# Patient Record
Sex: Male | Born: 1935
Health system: Southern US, Community
[De-identification: ages and names within clinical notes are randomized; demographics above are authoritative.]

## PROBLEM LIST (undated history)

## (undated) DIAGNOSIS — N400 Enlarged prostate without lower urinary tract symptoms: Secondary | ICD-10-CM

## (undated) DIAGNOSIS — I2510 Atherosclerotic heart disease of native coronary artery without angina pectoris: Secondary | ICD-10-CM

## (undated) DIAGNOSIS — M1990 Unspecified osteoarthritis, unspecified site: Secondary | ICD-10-CM

## (undated) DIAGNOSIS — K219 Gastro-esophageal reflux disease without esophagitis: Secondary | ICD-10-CM

## (undated) DIAGNOSIS — C801 Malignant (primary) neoplasm, unspecified: Secondary | ICD-10-CM

## (undated) DIAGNOSIS — E7800 Pure hypercholesterolemia, unspecified: Secondary | ICD-10-CM

## (undated) DIAGNOSIS — I10 Essential (primary) hypertension: Secondary | ICD-10-CM

## (undated) DIAGNOSIS — Z860100 Personal history of colon polyps, unspecified: Secondary | ICD-10-CM

## (undated) HISTORY — PX: SHOULDER SURGERY: SHX246

## (undated) HISTORY — DX: Essential (primary) hypertension: I10

## (undated) HISTORY — DX: Personal history of colonic polyps: Z86.010

## (undated) HISTORY — DX: Gastro-esophageal reflux disease without esophagitis: K21.9

## (undated) HISTORY — DX: Pure hypercholesterolemia, unspecified: E78.00

## (undated) HISTORY — DX: Benign prostatic hyperplasia without lower urinary tract symptoms: N40.0

## (undated) HISTORY — DX: Personal history of colon polyps, unspecified: Z86.0100

## (undated) HISTORY — DX: Unspecified osteoarthritis, unspecified site: M19.90

## (undated) SURGERY — ENDOSCOPIC RETROGRADE CHOLANGIOPANCREATOGRAPHY (ERCP) WITH PROPOFOL
Anesthesia: General

---

## 2003-04-17 ENCOUNTER — Ambulatory Visit (HOSPITAL_COMMUNITY): Admission: RE | Admit: 2003-04-17 | Discharge: 2003-04-17 | Payer: Self-pay | Admitting: Pulmonary Disease

## 2003-04-17 ENCOUNTER — Encounter: Payer: Self-pay | Admitting: Pulmonary Disease

## 2003-11-15 ENCOUNTER — Ambulatory Visit (HOSPITAL_COMMUNITY): Admission: RE | Admit: 2003-11-15 | Discharge: 2003-11-15 | Payer: Self-pay | Admitting: Gastroenterology

## 2004-01-17 ENCOUNTER — Ambulatory Visit (HOSPITAL_COMMUNITY): Admission: RE | Admit: 2004-01-17 | Discharge: 2004-01-17 | Payer: Self-pay | Admitting: Gastroenterology

## 2004-10-15 ENCOUNTER — Ambulatory Visit: Payer: Self-pay | Admitting: Pulmonary Disease

## 2005-04-15 ENCOUNTER — Ambulatory Visit: Payer: Self-pay | Admitting: Pulmonary Disease

## 2005-10-09 ENCOUNTER — Ambulatory Visit: Payer: Self-pay | Admitting: Pulmonary Disease

## 2006-01-19 ENCOUNTER — Ambulatory Visit: Payer: Self-pay | Admitting: Pulmonary Disease

## 2006-05-10 ENCOUNTER — Ambulatory Visit: Payer: Self-pay | Admitting: Pulmonary Disease

## 2006-05-14 ENCOUNTER — Ambulatory Visit: Payer: Self-pay | Admitting: Pulmonary Disease

## 2006-06-07 ENCOUNTER — Ambulatory Visit (HOSPITAL_COMMUNITY): Admission: RE | Admit: 2006-06-07 | Discharge: 2006-06-07 | Payer: Self-pay | Admitting: Orthopedic Surgery

## 2006-11-09 ENCOUNTER — Ambulatory Visit: Payer: Self-pay | Admitting: Pulmonary Disease

## 2007-05-11 ENCOUNTER — Ambulatory Visit: Payer: Self-pay | Admitting: Pulmonary Disease

## 2007-05-11 LAB — CONVERTED CEMR LAB
Albumin: 3.9 g/dL (ref 3.5–5.2)
BUN: 15 mg/dL (ref 6–23)
Basophils Relative: 0.1 % (ref 0.0–1.0)
Bilirubin Urine: NEGATIVE
CO2: 31 meq/L (ref 19–32)
Cholesterol: 202 mg/dL (ref 0–200)
Creatinine, Ser: 1 mg/dL (ref 0.4–1.5)
Direct LDL: 135 mg/dL
GFR calc non Af Amer: 79 mL/min
HCT: 44 % (ref 39.0–52.0)
HDL: 39.4 mg/dL (ref >39.0)
Hemoglobin, Urine: NEGATIVE
Hemoglobin: 15.2 g/dL (ref 13.0–17.0)
Leukocytes, UA: NEGATIVE
Monocytes Absolute: 0.5 10*3/uL (ref 0.2–0.7)
Monocytes Relative: 7.8 % (ref 3.0–11.0)
PSA: 1.13 ng/mL (ref 0.10–4.00)
Potassium: 4.6 meq/L (ref 3.5–5.1)
RDW: 12.3 % (ref 11.5–14.6)
Specific Gravity, Urine: 1.015 (ref 1.000–1.03)
TSH: 2.16 microintl units/mL (ref 0.35–5.50)
Triglycerides: 132 mg/dL (ref 0–149)
Urine Glucose: NEGATIVE mg/dL
WBC: 6.6 10*3/uL (ref 4.5–10.5)

## 2007-05-18 ENCOUNTER — Encounter: Payer: Self-pay | Admitting: Pulmonary Disease

## 2007-05-18 LAB — CONVERTED CEMR LAB
OCCULT 1: NEGATIVE
OCCULT 2: NEGATIVE
OCCULT 3: NEGATIVE
OCCULT 4: NEGATIVE

## 2007-06-07 ENCOUNTER — Ambulatory Visit: Payer: Self-pay | Admitting: Gastroenterology

## 2007-06-16 ENCOUNTER — Ambulatory Visit: Payer: Self-pay | Admitting: Gastroenterology

## 2008-05-02 ENCOUNTER — Ambulatory Visit: Payer: Self-pay | Admitting: Pulmonary Disease

## 2008-05-15 DIAGNOSIS — Z87898 Personal history of other specified conditions: Secondary | ICD-10-CM | POA: Insufficient documentation

## 2008-05-15 DIAGNOSIS — K219 Gastro-esophageal reflux disease without esophagitis: Secondary | ICD-10-CM | POA: Insufficient documentation

## 2008-05-15 DIAGNOSIS — I10 Essential (primary) hypertension: Secondary | ICD-10-CM

## 2008-05-15 DIAGNOSIS — D126 Benign neoplasm of colon, unspecified: Secondary | ICD-10-CM

## 2008-05-15 DIAGNOSIS — M1990 Unspecified osteoarthritis, unspecified site: Secondary | ICD-10-CM | POA: Insufficient documentation

## 2008-07-03 ENCOUNTER — Ambulatory Visit: Payer: Self-pay | Admitting: Pulmonary Disease

## 2008-07-03 DIAGNOSIS — E7800 Pure hypercholesterolemia, unspecified: Secondary | ICD-10-CM | POA: Insufficient documentation

## 2008-07-06 LAB — CONVERTED CEMR LAB
ALT: 19 units/L (ref 0–53)
AST: 25 units/L (ref 0–37)
Albumin: 4.1 g/dL (ref 3.5–5.2)
Alkaline Phosphatase: 52 units/L (ref 39–117)
BUN: 13 mg/dL (ref 6–23)
Bilirubin, Direct: 0.1 mg/dL (ref 0.0–0.3)
CO2: 31 meq/L (ref 19–32)
Chloride: 104 meq/L (ref 96–112)
Glucose, Bld: 103 mg/dL — ABNORMAL HIGH (ref 70–99)
Hemoglobin: 15.7 g/dL (ref 13.0–17.0)
LDL Cholesterol: 128 mg/dL — ABNORMAL HIGH (ref 0–99)
Lymphocytes Relative: 27.6 % (ref 12.0–46.0)
Monocytes Relative: 7.3 % (ref 3.0–12.0)
Neutro Abs: 3.9 10*3/uL (ref 1.4–7.7)
Neutrophils Relative %: 61.3 % (ref 43.0–77.0)
Potassium: 4.4 meq/L (ref 3.5–5.1)
RDW: 12.1 % (ref 11.5–14.6)
Sodium: 140 meq/L (ref 135–145)
Total CHOL/HDL Ratio: 4.7
Total Protein: 7.7 g/dL (ref 6.0–8.3)

## 2008-08-01 ENCOUNTER — Ambulatory Visit: Payer: Self-pay | Admitting: Pulmonary Disease

## 2008-11-12 ENCOUNTER — Ambulatory Visit: Payer: Self-pay | Admitting: Pulmonary Disease

## 2008-11-13 LAB — CONVERTED CEMR LAB
Basophils Absolute: 0 10*3/uL (ref 0.0–0.1)
Eosinophils Absolute: 0.4 10*3/uL (ref 0.0–0.7)
Folate: >20 ng/mL
HCT: 40.8 % (ref 39.0–52.0)
Hemoglobin: 14.2 g/dL (ref 13.0–17.0)
Lymphs Abs: 2.3 10*3/uL (ref 0.7–4.0)
MCHC: 34.8 g/dL (ref 30.0–36.0)
Monocytes Absolute: 0.7 10*3/uL (ref 0.1–1.0)
Neutro Abs: 4.3 10*3/uL (ref 1.4–7.7)
RDW: 12 % (ref 11.5–14.6)

## 2008-11-26 ENCOUNTER — Telehealth: Payer: Self-pay | Admitting: Pulmonary Disease

## 2008-12-25 ENCOUNTER — Ambulatory Visit: Payer: Self-pay | Admitting: Pulmonary Disease

## 2009-07-03 ENCOUNTER — Ambulatory Visit: Payer: Self-pay | Admitting: Pulmonary Disease

## 2009-07-03 LAB — CONVERTED CEMR LAB
AST: 25 units/L (ref 0–37)
Albumin: 4.2 g/dL (ref 3.5–5.2)
Alkaline Phosphatase: 41 units/L (ref 39–117)
BUN: 13 mg/dL (ref 6–23)
CO2: 30 meq/L (ref 19–32)
Chloride: 104 meq/L (ref 96–112)
Cholesterol: 206 mg/dL — ABNORMAL HIGH (ref 0–200)
Eosinophils Relative: 3.9 % (ref 0.0–5.0)
Glucose, Bld: 99 mg/dL (ref 70–99)
HCT: 43.9 % (ref 39.0–52.0)
MCV: 96.3 fL (ref 78.0–100.0)
Platelets: 145 10*3/uL — ABNORMAL LOW (ref 150.0–400.0)
Potassium: 4.6 meq/L (ref 3.5–5.1)
Total Protein: 7.4 g/dL (ref 6.0–8.3)
WBC: 6.2 10*3/uL (ref 4.5–10.5)

## 2009-10-09 ENCOUNTER — Telehealth: Payer: Self-pay | Admitting: Pulmonary Disease

## 2009-12-31 ENCOUNTER — Ambulatory Visit: Payer: Self-pay | Admitting: Pulmonary Disease

## 2010-01-02 ENCOUNTER — Ambulatory Visit: Payer: Self-pay | Admitting: Pulmonary Disease

## 2010-01-12 LAB — CONVERTED CEMR LAB
CO2: 24 meq/L (ref 19–32)
Calcium: 8.8 mg/dL (ref 8.4–10.5)
Cholesterol: 161 mg/dL (ref 0–200)
Glucose, Bld: 92 mg/dL (ref 70–99)
HDL: 37.8 mg/dL — ABNORMAL LOW (ref >39.00)
Potassium: 4.4 meq/L (ref 3.5–5.1)
Sodium: 140 meq/L (ref 135–145)
Triglycerides: 181 mg/dL — ABNORMAL HIGH (ref 0.0–149.0)
VLDL: 36.2 mg/dL (ref 0.0–40.0)

## 2010-05-01 ENCOUNTER — Encounter: Payer: Self-pay | Admitting: Pulmonary Disease

## 2010-07-01 ENCOUNTER — Ambulatory Visit: Payer: Self-pay | Admitting: Pulmonary Disease

## 2010-07-07 LAB — CONVERTED CEMR LAB
AST: 22 units/L (ref 0–37)
BUN: 17 mg/dL (ref 6–23)
Basophils Relative: 0.2 % (ref 0.0–3.0)
Bilirubin, Direct: 0.1 mg/dL (ref 0.0–0.3)
Chloride: 105 meq/L (ref 96–112)
Eosinophils Absolute: 0.3 10*3/uL (ref 0.0–0.7)
Eosinophils Relative: 4.2 % (ref 0.0–5.0)
GFR calc non Af Amer: 75.31 mL/min (ref >60)
HCT: 44.2 % (ref 39.0–52.0)
LDL Cholesterol: 113 mg/dL — ABNORMAL HIGH (ref 0–99)
Lymphs Abs: 2 10*3/uL (ref 0.7–4.0)
MCHC: 34.1 g/dL (ref 30.0–36.0)
MCV: 95.2 fL (ref 78.0–100.0)
Monocytes Absolute: 0.5 10*3/uL (ref 0.1–1.0)
Neutrophils Relative %: 57.6 % (ref 43.0–77.0)
Platelets: 166 10*3/uL (ref 150.0–400.0)
Potassium: 5 meq/L (ref 3.5–5.1)
Sodium: 140 meq/L (ref 135–145)
TSH: 1.71 microintl units/mL (ref 0.35–5.50)
Total Bilirubin: 0.9 mg/dL (ref 0.3–1.2)
Total CHOL/HDL Ratio: 4
WBC: 6.6 10*3/uL (ref 4.5–10.5)

## 2010-08-10 HISTORY — PX: CATARACT EXTRACTION, BILATERAL: SHX1313

## 2010-08-10 HISTORY — PX: MOHS SURGERY: SHX181

## 2010-09-09 NOTE — Miscellaneous (Signed)
Summary: Flu Vaccine / [facility]  Flu Vaccine / [facility]   Imported By: Lennie Odor 05/07/2010 16:52:57  _____________________________________________________________________  External Attachment:    Type:   Image     Comment:   External Document
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
## 2010-09-09 NOTE — Progress Notes (Signed)
Summary: stopped med  Phone Note Call from Patient Call back at [phone]   Caller: Patient Call For: Ronald Bird Reason for Call: Talk to Nurse Summary of Call: had to stop taking Cholesterol med due to body and muscle aches.  Please call pt to discuss. Initial call taken by: Eugene Gavia,  October 09, 2009 10:09 AM  Follow-up for Phone Call        Pt c/o bodyaches and cramps and stopped Pravastatin about 2 weeks ago and same has stopped. Pt states son had the same problem with Statins and had to go on Zetia. Pt wants to know SN recs. Please advise. Zackery Barefoot CMA  October 09, 2009 11:17 AM   Additional Follow-up for Phone Call Additional follow up Details #1::        PER SN HE AGREE'S TO STOP PRAVASTATIN FOR NOW, TRY ZETIA 10MG  1 by mouth once daily REFILL as needed  Additional Follow-up by: Philipp Deputy CMA,  October 09, 2009 3:39 PM    Additional Follow-up for Phone Call Additional follow up Details #2::    pt advised and rx sent. Carron Curie CMA  October 09, 2009 4:31 PM   New/Updated Medications: ZETIA 10 MG TABS (EZETIMIBE) Take 1 tablet by mouth once a day Prescriptions: ZETIA 10 MG TABS (EZETIMIBE) Take 1 tablet by mouth once a day  #30 x prn   Entered by:   Carron Curie CMA   Authorized by:   Michele Mcalpine MD   Signed by:   Carron Curie CMA on 10/09/2009   Method used:   Electronically to        [facility]  [id number] (retail)       [address]       [place], [place]  [address]       Ph: [phone] or [phone]       Fax: [phone]   RxID:   [id number]
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
## 2010-12-30 ENCOUNTER — Other Ambulatory Visit (INDEPENDENT_AMBULATORY_CARE_PROVIDER_SITE_OTHER): Payer: Medicare Other

## 2010-12-30 ENCOUNTER — Ambulatory Visit (INDEPENDENT_AMBULATORY_CARE_PROVIDER_SITE_OTHER): Payer: Medicare Other | Admitting: Pulmonary Disease

## 2010-12-30 ENCOUNTER — Encounter: Payer: Self-pay | Admitting: Pulmonary Disease

## 2010-12-30 DIAGNOSIS — R5381 Other malaise: Secondary | ICD-10-CM

## 2010-12-30 DIAGNOSIS — R531 Weakness: Secondary | ICD-10-CM

## 2010-12-30 DIAGNOSIS — I10 Essential (primary) hypertension: Secondary | ICD-10-CM

## 2010-12-30 DIAGNOSIS — R5383 Other fatigue: Secondary | ICD-10-CM

## 2010-12-30 DIAGNOSIS — E7800 Pure hypercholesterolemia, unspecified: Secondary | ICD-10-CM

## 2010-12-30 DIAGNOSIS — Z87898 Personal history of other specified conditions: Secondary | ICD-10-CM

## 2010-12-30 DIAGNOSIS — M1990 Unspecified osteoarthritis, unspecified site: Secondary | ICD-10-CM

## 2010-12-30 DIAGNOSIS — K219 Gastro-esophageal reflux disease without esophagitis: Secondary | ICD-10-CM

## 2010-12-30 DIAGNOSIS — D126 Benign neoplasm of colon, unspecified: Secondary | ICD-10-CM

## 2010-12-30 LAB — BASIC METABOLIC PANEL
BUN: 18 mg/dL (ref 6–23)
Chloride: 107 mEq/L (ref 96–112)
GFR: 76.92 mL/min (ref >60.00)
Glucose, Bld: 98 mg/dL (ref 70–99)
Potassium: 4.5 mEq/L (ref 3.5–5.1)

## 2010-12-30 LAB — LIPID PANEL
Cholesterol: 158 mg/dL (ref 0–200)
LDL Cholesterol: 91 mg/dL (ref 0–99)
VLDL: 27 mg/dL (ref 0.0–40.0)

## 2010-12-30 LAB — TESTOSTERONE: Testosterone: 452.71 ng/dL (ref 350.00–890.00)
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
## 2010-12-30 NOTE — Patient Instructions (Signed)
Today we updated your med list in EPIC...    Continue your current meds the same...  Today we did your follow up fasting blood work...    Please call the [facility]REE in a few days for your results...    Dial [id number] & when prompted enter your patient number followed by the # symbol...    Your patient number is:  [id number]#  Try to increase your exercise program... Call for any questions... Let's plan a follow up appt in 6 months.Marland KitchenMarland Kitchen

## 2011-07-01 ENCOUNTER — Encounter: Payer: Self-pay | Admitting: Pulmonary Disease

## 2011-07-01 ENCOUNTER — Ambulatory Visit (INDEPENDENT_AMBULATORY_CARE_PROVIDER_SITE_OTHER): Payer: Medicare Other | Admitting: Pulmonary Disease

## 2011-07-01 DIAGNOSIS — M1990 Unspecified osteoarthritis, unspecified site: Secondary | ICD-10-CM

## 2011-07-01 DIAGNOSIS — E7800 Pure hypercholesterolemia, unspecified: Secondary | ICD-10-CM

## 2011-07-01 DIAGNOSIS — Z87898 Personal history of other specified conditions: Secondary | ICD-10-CM

## 2011-07-01 DIAGNOSIS — I10 Essential (primary) hypertension: Secondary | ICD-10-CM

## 2011-07-01 DIAGNOSIS — K219 Gastro-esophageal reflux disease without esophagitis: Secondary | ICD-10-CM

## 2011-07-01 DIAGNOSIS — D126 Benign neoplasm of colon, unspecified: Secondary | ICD-10-CM

## 2011-07-01 MED ORDER — LISINOPRIL 20 MG PO TABS: 20.0000 mg | ORAL_TABLET | Freq: Every day | ORAL | Status: DC

## 2011-07-01 MED ORDER — EZETIMIBE 10 MG PO TABS: 10.0000 mg | ORAL_TABLET | Freq: Every day | ORAL | Status: DC

## 2011-07-01 MED ORDER — PANTOPRAZOLE SODIUM 40 MG PO TBEC: 40.0000 mg | DELAYED_RELEASE_TABLET | Freq: Every day | ORAL | Status: AC

## 2011-07-01 MED ORDER — AMLODIPINE BESYLATE 5 MG PO TABS: 5.0000 mg | ORAL_TABLET | Freq: Every day | ORAL | Status: DC

## 2011-07-01 MED ORDER — ATENOLOL 50 MG PO TABS: 50.0000 mg | ORAL_TABLET | Freq: Every day | ORAL | Status: DC

## 2011-07-01 NOTE — Patient Instructions (Signed)
Today we updated your med list in our EPIC system...    Continue your current medications the same...  We refilled your meds per request...  Call for any questions...  Let's plan a follow up visit w/ fasting blood work in 6 months.Marland KitchenMarland Kitchen

## 2011-07-30 ENCOUNTER — Encounter: Payer: Self-pay | Admitting: Pulmonary Disease
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
## 2011-12-30 ENCOUNTER — Ambulatory Visit: Payer: Medicare Other | Admitting: Pulmonary Disease

## 2012-06-03 ENCOUNTER — Encounter: Payer: Self-pay | Admitting: Gastroenterology

## 2012-11-30 ENCOUNTER — Encounter: Payer: Self-pay | Admitting: Gastroenterology

## 2014-09-13 DIAGNOSIS — M5021 Other cervical disc displacement,  high cervical region: Secondary | ICD-10-CM | POA: Diagnosis not present

## 2014-09-13 DIAGNOSIS — M9903 Segmental and somatic dysfunction of lumbar region: Secondary | ICD-10-CM | POA: Diagnosis not present

## 2014-09-13 DIAGNOSIS — M9901 Segmental and somatic dysfunction of cervical region: Secondary | ICD-10-CM | POA: Diagnosis not present

## 2014-09-13 DIAGNOSIS — M9905 Segmental and somatic dysfunction of pelvic region: Secondary | ICD-10-CM | POA: Diagnosis not present

## 2014-09-13 DIAGNOSIS — M5416 Radiculopathy, lumbar region: Secondary | ICD-10-CM | POA: Diagnosis not present

## 2014-10-02 DIAGNOSIS — L02415 Cutaneous abscess of right lower limb: Secondary | ICD-10-CM | POA: Diagnosis not present

## 2014-10-30 DIAGNOSIS — M5416 Radiculopathy, lumbar region: Secondary | ICD-10-CM | POA: Diagnosis not present

## 2014-10-30 DIAGNOSIS — M9903 Segmental and somatic dysfunction of lumbar region: Secondary | ICD-10-CM | POA: Diagnosis not present

## 2014-10-30 DIAGNOSIS — M9901 Segmental and somatic dysfunction of cervical region: Secondary | ICD-10-CM | POA: Diagnosis not present

## 2014-10-30 DIAGNOSIS — M9905 Segmental and somatic dysfunction of pelvic region: Secondary | ICD-10-CM | POA: Diagnosis not present

## 2014-10-30 DIAGNOSIS — M5021 Other cervical disc displacement,  high cervical region: Secondary | ICD-10-CM | POA: Diagnosis not present

## 2014-12-11 DIAGNOSIS — M5416 Radiculopathy, lumbar region: Secondary | ICD-10-CM | POA: Diagnosis not present

## 2014-12-11 DIAGNOSIS — M9905 Segmental and somatic dysfunction of pelvic region: Secondary | ICD-10-CM | POA: Diagnosis not present

## 2014-12-11 DIAGNOSIS — M9903 Segmental and somatic dysfunction of lumbar region: Secondary | ICD-10-CM | POA: Diagnosis not present

## 2014-12-11 DIAGNOSIS — M9901 Segmental and somatic dysfunction of cervical region: Secondary | ICD-10-CM | POA: Diagnosis not present

## 2014-12-11 DIAGNOSIS — M5021 Other cervical disc displacement,  high cervical region: Secondary | ICD-10-CM | POA: Diagnosis not present

## 2014-12-19 DIAGNOSIS — Z79899 Other long term (current) drug therapy: Secondary | ICD-10-CM | POA: Diagnosis not present

## 2014-12-19 DIAGNOSIS — K210 Gastro-esophageal reflux disease with esophagitis: Secondary | ICD-10-CM | POA: Diagnosis not present

## 2014-12-19 DIAGNOSIS — I10 Essential (primary) hypertension: Secondary | ICD-10-CM | POA: Diagnosis not present

## 2014-12-19 DIAGNOSIS — E785 Hyperlipidemia, unspecified: Secondary | ICD-10-CM | POA: Diagnosis not present

## 2015-02-19 DIAGNOSIS — M5416 Radiculopathy, lumbar region: Secondary | ICD-10-CM | POA: Diagnosis not present

## 2015-02-19 DIAGNOSIS — M9901 Segmental and somatic dysfunction of cervical region: Secondary | ICD-10-CM | POA: Diagnosis not present

## 2015-02-19 DIAGNOSIS — M9903 Segmental and somatic dysfunction of lumbar region: Secondary | ICD-10-CM | POA: Diagnosis not present

## 2015-02-19 DIAGNOSIS — M5021 Other cervical disc displacement,  high cervical region: Secondary | ICD-10-CM | POA: Diagnosis not present

## 2015-02-19 DIAGNOSIS — M9905 Segmental and somatic dysfunction of pelvic region: Secondary | ICD-10-CM | POA: Diagnosis not present

## 2015-03-22 DIAGNOSIS — Z79899 Other long term (current) drug therapy: Secondary | ICD-10-CM | POA: Diagnosis not present

## 2015-03-22 DIAGNOSIS — R946 Abnormal results of thyroid function studies: Secondary | ICD-10-CM | POA: Diagnosis not present

## 2015-03-22 DIAGNOSIS — R5383 Other fatigue: Secondary | ICD-10-CM | POA: Diagnosis not present

## 2015-03-22 DIAGNOSIS — R200 Anesthesia of skin: Secondary | ICD-10-CM | POA: Diagnosis not present

## 2015-03-22 DIAGNOSIS — K210 Gastro-esophageal reflux disease with esophagitis: Secondary | ICD-10-CM | POA: Diagnosis not present

## 2015-03-22 DIAGNOSIS — R202 Paresthesia of skin: Secondary | ICD-10-CM | POA: Diagnosis not present

## 2015-05-03 DIAGNOSIS — Z23 Encounter for immunization: Secondary | ICD-10-CM | POA: Diagnosis not present

## 2015-06-20 DIAGNOSIS — I10 Essential (primary) hypertension: Secondary | ICD-10-CM | POA: Diagnosis not present

## 2015-06-20 DIAGNOSIS — Z79899 Other long term (current) drug therapy: Secondary | ICD-10-CM | POA: Diagnosis not present

## 2015-06-20 DIAGNOSIS — E785 Hyperlipidemia, unspecified: Secondary | ICD-10-CM | POA: Diagnosis not present

## 2015-06-20 DIAGNOSIS — Z125 Encounter for screening for malignant neoplasm of prostate: Secondary | ICD-10-CM | POA: Diagnosis not present

## 2015-06-20 DIAGNOSIS — R748 Abnormal levels of other serum enzymes: Secondary | ICD-10-CM | POA: Diagnosis not present

## 2015-06-20 DIAGNOSIS — Z0000 Encounter for general adult medical examination without abnormal findings: Secondary | ICD-10-CM | POA: Diagnosis not present

## 2015-06-20 DIAGNOSIS — Z1211 Encounter for screening for malignant neoplasm of colon: Secondary | ICD-10-CM | POA: Diagnosis not present

## 2015-06-20 DIAGNOSIS — K210 Gastro-esophageal reflux disease with esophagitis: Secondary | ICD-10-CM | POA: Diagnosis not present

## 2015-06-20 DIAGNOSIS — Z136 Encounter for screening for cardiovascular disorders: Secondary | ICD-10-CM | POA: Diagnosis not present

## 2015-06-20 DIAGNOSIS — M7701 Medial epicondylitis, right elbow: Secondary | ICD-10-CM | POA: Diagnosis not present

## 2015-06-25 DIAGNOSIS — Z1211 Encounter for screening for malignant neoplasm of colon: Secondary | ICD-10-CM | POA: Diagnosis not present

## 2015-07-25 DIAGNOSIS — Z961 Presence of intraocular lens: Secondary | ICD-10-CM | POA: Diagnosis not present

## 2015-07-25 DIAGNOSIS — H524 Presbyopia: Secondary | ICD-10-CM | POA: Diagnosis not present

## 2015-07-25 DIAGNOSIS — H43813 Vitreous degeneration, bilateral: Secondary | ICD-10-CM | POA: Diagnosis not present

## 2015-07-25 DIAGNOSIS — H26493 Other secondary cataract, bilateral: Secondary | ICD-10-CM | POA: Diagnosis not present

## 2015-07-30 DIAGNOSIS — J309 Allergic rhinitis, unspecified: Secondary | ICD-10-CM | POA: Diagnosis not present

## 2015-07-30 DIAGNOSIS — I10 Essential (primary) hypertension: Secondary | ICD-10-CM | POA: Diagnosis not present

## 2015-08-22 DIAGNOSIS — J069 Acute upper respiratory infection, unspecified: Secondary | ICD-10-CM | POA: Diagnosis not present

## 2015-08-29 DIAGNOSIS — I10 Essential (primary) hypertension: Secondary | ICD-10-CM | POA: Diagnosis not present

## 2015-09-02 ENCOUNTER — Encounter: Payer: Self-pay | Admitting: Gastroenterology

## 2015-12-19 DIAGNOSIS — E785 Hyperlipidemia, unspecified: Secondary | ICD-10-CM | POA: Diagnosis not present

## 2015-12-19 DIAGNOSIS — Z79899 Other long term (current) drug therapy: Secondary | ICD-10-CM | POA: Diagnosis not present

## 2015-12-19 DIAGNOSIS — I10 Essential (primary) hypertension: Secondary | ICD-10-CM | POA: Diagnosis not present

## 2015-12-19 DIAGNOSIS — K210 Gastro-esophageal reflux disease with esophagitis: Secondary | ICD-10-CM | POA: Diagnosis not present

## 2016-03-20 DIAGNOSIS — K210 Gastro-esophageal reflux disease with esophagitis: Secondary | ICD-10-CM | POA: Diagnosis not present

## 2016-03-20 DIAGNOSIS — R946 Abnormal results of thyroid function studies: Secondary | ICD-10-CM | POA: Diagnosis not present

## 2016-03-20 DIAGNOSIS — Z9181 History of falling: Secondary | ICD-10-CM | POA: Diagnosis not present

## 2016-03-20 DIAGNOSIS — E785 Hyperlipidemia, unspecified: Secondary | ICD-10-CM | POA: Diagnosis not present

## 2016-03-20 DIAGNOSIS — Z79899 Other long term (current) drug therapy: Secondary | ICD-10-CM | POA: Diagnosis not present

## 2016-03-20 DIAGNOSIS — Z1389 Encounter for screening for other disorder: Secondary | ICD-10-CM | POA: Diagnosis not present

## 2016-03-20 DIAGNOSIS — E663 Overweight: Secondary | ICD-10-CM | POA: Diagnosis not present

## 2016-03-20 DIAGNOSIS — I10 Essential (primary) hypertension: Secondary | ICD-10-CM | POA: Diagnosis not present

## 2016-06-22 DIAGNOSIS — Z79899 Other long term (current) drug therapy: Secondary | ICD-10-CM | POA: Diagnosis not present

## 2016-06-22 DIAGNOSIS — J309 Allergic rhinitis, unspecified: Secondary | ICD-10-CM | POA: Diagnosis not present

## 2016-06-22 DIAGNOSIS — R55 Syncope and collapse: Secondary | ICD-10-CM | POA: Diagnosis not present

## 2016-06-22 DIAGNOSIS — H9311 Tinnitus, right ear: Secondary | ICD-10-CM | POA: Diagnosis not present

## 2016-07-28 DIAGNOSIS — E785 Hyperlipidemia, unspecified: Secondary | ICD-10-CM | POA: Diagnosis not present

## 2016-07-28 DIAGNOSIS — Z125 Encounter for screening for malignant neoplasm of prostate: Secondary | ICD-10-CM | POA: Diagnosis not present

## 2016-07-28 DIAGNOSIS — I10 Essential (primary) hypertension: Secondary | ICD-10-CM | POA: Diagnosis not present

## 2016-07-28 DIAGNOSIS — Z9181 History of falling: Secondary | ICD-10-CM | POA: Diagnosis not present

## 2016-07-28 DIAGNOSIS — Z79899 Other long term (current) drug therapy: Secondary | ICD-10-CM | POA: Diagnosis not present

## 2016-07-28 DIAGNOSIS — Z1211 Encounter for screening for malignant neoplasm of colon: Secondary | ICD-10-CM | POA: Diagnosis not present

## 2016-07-28 DIAGNOSIS — Z0000 Encounter for general adult medical examination without abnormal findings: Secondary | ICD-10-CM | POA: Diagnosis not present

## 2016-08-06 DIAGNOSIS — E785 Hyperlipidemia, unspecified: Secondary | ICD-10-CM | POA: Diagnosis not present

## 2016-08-06 DIAGNOSIS — I129 Hypertensive chronic kidney disease with stage 1 through stage 4 chronic kidney disease, or unspecified chronic kidney disease: Secondary | ICD-10-CM | POA: Diagnosis not present

## 2016-08-06 DIAGNOSIS — J309 Allergic rhinitis, unspecified: Secondary | ICD-10-CM | POA: Diagnosis not present

## 2016-08-06 DIAGNOSIS — R05 Cough: Secondary | ICD-10-CM | POA: Diagnosis not present

## 2016-08-06 DIAGNOSIS — Z87898 Personal history of other specified conditions: Secondary | ICD-10-CM | POA: Diagnosis not present

## 2016-08-06 DIAGNOSIS — N182 Chronic kidney disease, stage 2 (mild): Secondary | ICD-10-CM | POA: Diagnosis not present

## 2016-08-06 DIAGNOSIS — K210 Gastro-esophageal reflux disease with esophagitis: Secondary | ICD-10-CM | POA: Diagnosis not present

## 2016-08-06 DIAGNOSIS — J3489 Other specified disorders of nose and nasal sinuses: Secondary | ICD-10-CM | POA: Diagnosis not present

## 2016-08-11 DIAGNOSIS — R001 Bradycardia, unspecified: Secondary | ICD-10-CM | POA: Diagnosis not present

## 2016-08-11 DIAGNOSIS — J0140 Acute pansinusitis, unspecified: Secondary | ICD-10-CM | POA: Diagnosis not present

## 2016-08-11 DIAGNOSIS — J209 Acute bronchitis, unspecified: Secondary | ICD-10-CM | POA: Diagnosis not present

## 2016-08-11 DIAGNOSIS — R05 Cough: Secondary | ICD-10-CM | POA: Diagnosis not present

## 2016-08-11 DIAGNOSIS — R946 Abnormal results of thyroid function studies: Secondary | ICD-10-CM | POA: Diagnosis not present

## 2016-09-01 DIAGNOSIS — J209 Acute bronchitis, unspecified: Secondary | ICD-10-CM | POA: Diagnosis not present

## 2016-09-01 DIAGNOSIS — R05 Cough: Secondary | ICD-10-CM | POA: Diagnosis not present

## 2016-09-22 DIAGNOSIS — J209 Acute bronchitis, unspecified: Secondary | ICD-10-CM | POA: Diagnosis not present

## 2016-11-02 DIAGNOSIS — J22 Unspecified acute lower respiratory infection: Secondary | ICD-10-CM | POA: Diagnosis not present

## 2016-11-02 DIAGNOSIS — J101 Influenza due to other identified influenza virus with other respiratory manifestations: Secondary | ICD-10-CM | POA: Diagnosis not present

## 2016-11-02 DIAGNOSIS — R252 Cramp and spasm: Secondary | ICD-10-CM | POA: Diagnosis not present

## 2016-11-16 DIAGNOSIS — R05 Cough: Secondary | ICD-10-CM | POA: Diagnosis not present

## 2016-11-16 DIAGNOSIS — J309 Allergic rhinitis, unspecified: Secondary | ICD-10-CM | POA: Diagnosis not present

## 2016-11-16 DIAGNOSIS — J22 Unspecified acute lower respiratory infection: Secondary | ICD-10-CM | POA: Diagnosis not present

## 2017-01-08 DIAGNOSIS — R438 Other disturbances of smell and taste: Secondary | ICD-10-CM | POA: Diagnosis not present

## 2017-01-08 DIAGNOSIS — J029 Acute pharyngitis, unspecified: Secondary | ICD-10-CM | POA: Diagnosis not present

## 2017-01-08 DIAGNOSIS — J22 Unspecified acute lower respiratory infection: Secondary | ICD-10-CM | POA: Diagnosis not present

## 2017-01-13 DIAGNOSIS — R5383 Other fatigue: Secondary | ICD-10-CM | POA: Diagnosis not present

## 2017-01-13 DIAGNOSIS — N529 Male erectile dysfunction, unspecified: Secondary | ICD-10-CM | POA: Diagnosis not present

## 2017-01-13 DIAGNOSIS — N182 Chronic kidney disease, stage 2 (mild): Secondary | ICD-10-CM | POA: Diagnosis not present

## 2017-01-13 DIAGNOSIS — R3912 Poor urinary stream: Secondary | ICD-10-CM | POA: Diagnosis not present

## 2017-01-13 DIAGNOSIS — N401 Enlarged prostate with lower urinary tract symptoms: Secondary | ICD-10-CM | POA: Diagnosis not present

## 2017-01-13 DIAGNOSIS — I129 Hypertensive chronic kidney disease with stage 1 through stage 4 chronic kidney disease, or unspecified chronic kidney disease: Secondary | ICD-10-CM | POA: Diagnosis not present

## 2017-01-25 DIAGNOSIS — J342 Deviated nasal septum: Secondary | ICD-10-CM | POA: Diagnosis not present

## 2017-01-25 DIAGNOSIS — R439 Unspecified disturbances of smell and taste: Secondary | ICD-10-CM | POA: Diagnosis not present

## 2017-01-25 DIAGNOSIS — J343 Hypertrophy of nasal turbinates: Secondary | ICD-10-CM | POA: Diagnosis not present

## 2017-01-25 DIAGNOSIS — R0981 Nasal congestion: Secondary | ICD-10-CM | POA: Diagnosis not present

## 2017-01-25 DIAGNOSIS — J3489 Other specified disorders of nose and nasal sinuses: Secondary | ICD-10-CM | POA: Diagnosis not present

## 2017-01-25 DIAGNOSIS — J329 Chronic sinusitis, unspecified: Secondary | ICD-10-CM | POA: Diagnosis not present

## 2017-01-27 DIAGNOSIS — J342 Deviated nasal septum: Secondary | ICD-10-CM | POA: Diagnosis not present

## 2017-01-27 DIAGNOSIS — J329 Chronic sinusitis, unspecified: Secondary | ICD-10-CM | POA: Diagnosis not present

## 2017-01-29 DIAGNOSIS — J3489 Other specified disorders of nose and nasal sinuses: Secondary | ICD-10-CM | POA: Diagnosis not present

## 2017-01-29 DIAGNOSIS — R0981 Nasal congestion: Secondary | ICD-10-CM | POA: Diagnosis not present

## 2017-01-29 DIAGNOSIS — J343 Hypertrophy of nasal turbinates: Secondary | ICD-10-CM | POA: Diagnosis not present

## 2017-01-29 DIAGNOSIS — J329 Chronic sinusitis, unspecified: Secondary | ICD-10-CM | POA: Diagnosis not present

## 2017-01-29 DIAGNOSIS — J342 Deviated nasal septum: Secondary | ICD-10-CM | POA: Diagnosis not present

## 2017-02-04 DIAGNOSIS — R5383 Other fatigue: Secondary | ICD-10-CM | POA: Diagnosis not present

## 2017-02-04 DIAGNOSIS — N182 Chronic kidney disease, stage 2 (mild): Secondary | ICD-10-CM | POA: Diagnosis not present

## 2017-02-04 DIAGNOSIS — N529 Male erectile dysfunction, unspecified: Secondary | ICD-10-CM | POA: Diagnosis not present

## 2017-02-04 DIAGNOSIS — E785 Hyperlipidemia, unspecified: Secondary | ICD-10-CM | POA: Diagnosis not present

## 2017-02-04 DIAGNOSIS — I129 Hypertensive chronic kidney disease with stage 1 through stage 4 chronic kidney disease, or unspecified chronic kidney disease: Secondary | ICD-10-CM | POA: Diagnosis not present

## 2017-02-04 DIAGNOSIS — N401 Enlarged prostate with lower urinary tract symptoms: Secondary | ICD-10-CM | POA: Diagnosis not present

## 2017-02-04 DIAGNOSIS — R3912 Poor urinary stream: Secondary | ICD-10-CM | POA: Diagnosis not present

## 2017-03-24 DIAGNOSIS — R000 Tachycardia, unspecified: Secondary | ICD-10-CM | POA: Diagnosis not present

## 2017-03-29 ENCOUNTER — Encounter: Payer: Self-pay | Admitting: Cardiology

## 2017-03-29 ENCOUNTER — Ambulatory Visit (INDEPENDENT_AMBULATORY_CARE_PROVIDER_SITE_OTHER): Payer: Medicare Other | Admitting: Cardiology

## 2017-03-29 VITALS — BP 134/72 | HR 88 | Resp 10 | Ht 73.0 in | Wt 193.1 lb

## 2017-03-29 DIAGNOSIS — E7800 Pure hypercholesterolemia, unspecified: Secondary | ICD-10-CM

## 2017-03-29 DIAGNOSIS — R5383 Other fatigue: Secondary | ICD-10-CM | POA: Diagnosis not present

## 2017-03-29 DIAGNOSIS — I10 Essential (primary) hypertension: Secondary | ICD-10-CM | POA: Diagnosis not present

## 2017-03-29 DIAGNOSIS — R002 Palpitations: Secondary | ICD-10-CM

## 2017-03-29 NOTE — Progress Notes (Signed)
Cardiology Consultation:    Date:  03/29/2017   ID:  Ronald Bird, DOB [birth year], MRN [id number]  PCP:  Melony Overly, MD  Cardiologist:  Jenne Campus, MD   Referring MD: Noralee Space, MD   Chief Complaint  Patient presents with  . Tachycardia    One Episode, has family history of heart problems  I have fast heart rate  History of Present Illness:    Ronald Bird is a 81 y.o. male who is being seen today for the evaluation of Tachycardia at the request of Noralee Space, MD. He exercises on the radial basis. He noticed one day on August 15 that this artery was extraordinarily high after he finished exercises. Again this is routine that he does every day and this is first time he fine his heart rate being elevated that much. He said when he was exercising on the treadmill he did not do as well as usually. But there is no chest pain no shortness of breath no sweating necessary with the sensation. It took some time for his heart rate returned to baseline which is about 70. He got concerned and wanted to be seen. Denies having any heart problem. He does have essential hypertension, he son did have atrial fibrillation did have atrial fibrillation ablation and died because of complication of it.  He denies having any syncope dizziness. After  Past Medical History:  Diagnosis Date  . BPH (benign prostatic hyperplasia)   . DJD (degenerative joint disease)   . GERD (gastroesophageal reflux disease)   . History of colonic polyps   . Hypercholesteremia   . Hypertension     Past Surgical History:  Procedure Laterality Date  . CATARACT EXTRACTION, BILATERAL  2012   In [facility]  . MOHS SURGERY  2012   Skin Cancer removed from nose by Moh's in [place]  . SHOULDER SURGERY      Current Medications: Current Meds  Medication Sig  . aspirin 81 MG tablet Take 81 mg by mouth daily.    . calcium carbonate (TUMS - DOSED IN MG ELEMENTAL CALCIUM) 500 MG chewable tablet  Chew 1 tablet by mouth as needed for indigestion or heartburn.  . cetirizine (ZYRTEC) 10 MG chewable tablet Chew 10 mg by mouth daily.  . Cinnamon 500 MG TABS Take 2 tablets by mouth daily.    . Cyanocobalamin (B-12) 2500 MCG SUBL Place 1 tablet under the tongue daily.  Marland Kitchen docusate sodium (COLACE) 100 MG capsule Take 100 mg by mouth daily.  Marland Kitchen ezetimibe (ZETIA) 10 MG tablet Take 1 tablet (10 mg total) by mouth daily.  . fish oil-omega-3 fatty acids 1000 MG capsule Take 2 g by mouth 3 (three) times daily.    . fluticasone (FLONASE) 50 MCG/ACT nasal spray Place into both nostrils daily.  Marland Kitchen glucosamine-chondroitin 500-400 MG tablet Take 1 tablet by mouth daily.    . Lactobacillus (PROBIOTIC ACIDOPHILUS PO) Take 1 tablet by mouth daily.  Marland Kitchen lisinopril (PRINIVIL,ZESTRIL) 20 MG tablet Take 1 tablet (20 mg total) by mouth daily. (Patient taking differently: Take 40 mg by mouth daily. )  . Lycopene 10 MG CAPS Take 1 capsule by mouth daily.    Marland Kitchen MAGNESIUM CARBONATE PO Take 375 mg by mouth daily.  . Methylcellulose, Laxative, (CITRUCEL PO) Take by mouth.  . Multiple Vitamin (MULTIVITAMIN) capsule Take 1 capsule by mouth daily.    . pantoprazole (PROTONIX) 40 MG tablet Take 1 tablet (40 mg total) by mouth  daily. 30 mins before a meal   . Saw Palmetto, Serenoa repens, 1000 MG CAPS Take 2 capsules by mouth 2 (two) times daily.    . sildenafil (REVATIO) 20 MG tablet Take 20 mg by mouth daily.     Allergies:   Patient has no known allergies.   Social History   Social History  . Marital status: Married    Spouse name: [name]  . Number of children: 2  . Years of education: N/A   Social History Main Topics  . Smoking status: Never Smoker  . Smokeless tobacco: Never Used  . Alcohol use No  . Drug use: No  . Sexual activity: Not Asked   Other Topics Concern  . None   Social History Narrative  . None     Family History: The patient's family history includes Atrial fibrillation in his brother;  Heart disease in his father and mother; Stroke in his father and mother. ROS:   Please see the history of present illness.    All 14 point review of systems negative except as described per history of present illness.  EKGs/Labs/Other Studies Reviewed:    The following studies were reviewed today:   EKG:  EKG is  ordered today.  The ekg ordered today demonstrates Normal sinus rhythm, left axis deviation, nonspecific ST-T segment changes  Recent Labs: No results found for requested labs within last 8760 hours.  Recent Lipid Panel    Component Value Date/Time   CHOL 158 12/30/2010 1038   TRIG 135.0 12/30/2010 1038   HDL 40.40 12/30/2010 1038   CHOLHDL 4 12/30/2010 1038   VLDL 27.0 12/30/2010 1038   LDLCALC 91 12/30/2010 1038   LDLDIRECT 146.6 07/03/2009 0947    Physical Exam:    VS:  BP 134/72   Pulse 88   Resp 10   Ht 6\' 1"  (1.854 m)   Wt 193 lb 1.9 oz (87.6 kg)   BMI 25.48 kg/m     Wt Readings from Last 3 Encounters:  03/29/17 193 lb 1.9 oz (87.6 kg)  07/01/11 199 lb 3.2 oz (90.4 kg)  12/30/10 197 lb 9.6 oz (89.6 kg)     GEN:  Well nourished, well developed in no acute distress HEENT: Normal NECK: No JVD; No carotid bruits LYMPHATICS: No lymphadenopathy CARDIAC: RRR, no murmurs, no rubs, no gallops RESPIRATORY:  Clear to auscultation without rales, wheezing or rhonchi  ABDOMEN: Soft, non-tender, non-distended MUSCULOSKELETAL:  No edema; No deformity  SKIN: Warm and dry NEUROLOGIC:  Alert and oriented x 3 PSYCHIATRIC:  Normal affect   ASSESSMENT:    1. HYPERCHOLESTEROLEMIA   2. Palpitations   3. Fatigue, unspecified type    PLAN:    In order of problems listed above:  Hypercholesterolemia: Cholesterosis acceptable we'll continue present management.   Tachycardia, suspicious for atrial fibrillation. I will ask him to were event recorder. He will also be scheduled to have an echocardiogram to look at the left ventricular ejection fraction and more  importantly size of the left atrium.  Fatigue: Again we'll do echocardiogram to look at left ventricle ejection fraction. If he truly does have atrial fibrillation in need to be anticoagulated he's CHADS2Vas score is 3. In the meantime until we have clear diagnosis he needs to take aspirin every day wich he already does.  Medication Adjustments/Labs and Tests Ordered: Current medicines are reviewed at length with the patient today.  Concerns regarding medicines are outlined above.  No orders of the defined types were placed  in this encounter.  No orders of the defined types were placed in this encounter.   Signed, Park Liter, MD, [facility]. 03/29/2017 3:03 PM    [facility]

## 2017-03-29 NOTE — Progress Notes (Signed)
lungs

## 2017-03-29 NOTE — Patient Instructions (Signed)
Medication Instructions:  Your physician recommends that you continue on your current medications as directed. Please refer to the Current Medication list given to you today.  Labwork: None   Testing/Procedures: Your physician has requested that you have an echocardiogram. Echocardiography is a painless test that uses sound waves to create images of your heart. It provides your doctor with information about the size and shape of your heart and how well your heart's chambers and valves are working. This procedure takes approximately one hour. There are no restrictions for this procedure.  Your physician has recommended that you wear an event monitor. Event monitors are medical devices that record the heart's electrical activity. Doctors most often [place] these monitors to diagnose arrhythmias. Arrhythmias are problems with the speed or rhythm of the heartbeat. The monitor is a small, portable device. You can wear one while you do your normal daily activities. This is usually used to diagnose what is causing palpitations/syncope (passing out).   Please report to [address] [place], [place] the day of your testing.     Follow-Up: Your physician recommends that you schedule a follow-up appointment in: 1.5 months   Any Other Special Instructions Will Be Listed Below (If Applicable).  Please note that any paperwork needing to be filled out by the provider will need to be addressed at the front desk prior to seeing the provider. Please note that any paperwork FMLA, Disability or other documents regarding health condition is subject to a $25.00 charge that must be received prior to completion of paperwork in the form of a money order or check.     If you need a refill on your cardiac medications before your next appointment, please call your pharmacy.

## 2017-04-01 NOTE — Addendum Note (Signed)
Addended by: Kathyrn Sheriff on: 04/01/2017 08:45 AM   Modules accepted: Orders

## 2017-04-08 ENCOUNTER — Other Ambulatory Visit: Payer: Self-pay

## 2017-04-08 ENCOUNTER — Ambulatory Visit (INDEPENDENT_AMBULATORY_CARE_PROVIDER_SITE_OTHER): Payer: Medicare Other

## 2017-04-08 ENCOUNTER — Ambulatory Visit (HOSPITAL_COMMUNITY): Payer: Medicare Other | Attending: Cardiovascular Disease

## 2017-04-08 DIAGNOSIS — R5383 Other fatigue: Secondary | ICD-10-CM | POA: Diagnosis not present

## 2017-04-08 DIAGNOSIS — E785 Hyperlipidemia, unspecified: Secondary | ICD-10-CM | POA: Insufficient documentation

## 2017-04-08 DIAGNOSIS — R002 Palpitations: Secondary | ICD-10-CM

## 2017-04-08 DIAGNOSIS — I10 Essential (primary) hypertension: Secondary | ICD-10-CM | POA: Diagnosis not present

## 2017-04-08 DIAGNOSIS — I083 Combined rheumatic disorders of mitral, aortic and tricuspid valves: Secondary | ICD-10-CM | POA: Insufficient documentation

## 2017-05-13 DIAGNOSIS — Z23 Encounter for immunization: Secondary | ICD-10-CM | POA: Diagnosis not present

## 2017-05-31 ENCOUNTER — Ambulatory Visit: Payer: Medicare Other | Admitting: Cardiology

## 2017-06-02 ENCOUNTER — Ambulatory Visit (INDEPENDENT_AMBULATORY_CARE_PROVIDER_SITE_OTHER): Payer: Medicare Other | Admitting: Cardiology

## 2017-06-02 VITALS — BP 150/80 | HR 84 | Resp 10 | Ht 73.0 in | Wt 195.0 lb

## 2017-06-02 DIAGNOSIS — R002 Palpitations: Secondary | ICD-10-CM

## 2017-06-02 DIAGNOSIS — R5383 Other fatigue: Secondary | ICD-10-CM

## 2017-06-02 DIAGNOSIS — I10 Essential (primary) hypertension: Secondary | ICD-10-CM

## 2017-06-02 DIAGNOSIS — R531 Weakness: Secondary | ICD-10-CM

## 2017-06-02 NOTE — Patient Instructions (Signed)
Medication Instructions:  Your physician recommends that you continue on your current medications as directed. Please refer to the Current Medication list given to you today.  1. Avoid all over-the-counter antihistamines except Claritin/Loratadine and Zyrtec/Cetrizine. 2. Avoid all combination including cold sinus allergies flu decongestant and sleep medications 3. You can use Robitussin DM Mucinex and Mucinex DM for cough. 4. can use Tylenol aspirin ibuprofen and naproxen but no combinations such as sleep or sinus.  Labwork: None   Testing/Procedures: None   Follow-Up: Your physician recommends that you schedule a follow-up appointment in: 4 months   Any Other Special Instructions Will Be Listed Below (If Applicable).  Please note that any paperwork needing to be filled out by the provider will need to be addressed at the front desk prior to seeing the provider. Please note that any paperwork FMLA, Disability or other documents regarding health condition is subject to a $25.00 charge that must be received prior to completion of paperwork in the form of a money order or check.    If you need a refill on your cardiac medications before your next appointment, please call your pharmacy.

## 2017-06-03 NOTE — Progress Notes (Signed)
Cardiology Office Note:    Date:  06/03/2017   ID:  Ronald Bird, DOB 1936/05/05, MRN [id number]  PCP:  Melony Overly, MD  Cardiologist:  Jenne Campus, MD    Referring MD: Melony Overly, MD   Chief Complaint  Patient presents with  . 2 month follow up  Doing great  History of Present Illness:    Ronald Bird is a 81 y.o. male  with palpitations. Worries that he may have atrial fibrillation. He did work event recorder for a month, there is no evidence of any arrhythmia. He said that he is feeling better overall and denies having any palpitations now. He purchased Chad and continue monitoring his heart rate and rhythm. He did echocardiac, he which showed normal left atrium. So far I do not see anything that would suggest presence of atrial fibrillation. Overall he is feeling well. No chest pain tightness squeezing pressure burning chest. We will elected to continue monitoring. He is still taking aspirin which I advised him to continue.  Past Medical History:  Diagnosis Date  . BPH (benign prostatic hyperplasia)   . DJD (degenerative joint disease)   . GERD (gastroesophageal reflux disease)   . History of colonic polyps   . Hypercholesteremia   . Hypertension     Past Surgical History:  Procedure Laterality Date  . CATARACT EXTRACTION, BILATERAL  2012   In [facility]  . MOHS SURGERY  2012   Skin Cancer removed from nose by Moh's in [place]  . SHOULDER SURGERY      Current Medications: Current Meds  Medication Sig  . aspirin 81 MG tablet Take 81 mg by mouth daily.    . budesonide (RHINOCORT ALLERGY) 32 MCG/ACT nasal spray Place 2 sprays into both nostrils daily.  . calcium carbonate (TUMS - DOSED IN MG ELEMENTAL CALCIUM) 500 MG chewable tablet Chew 1 tablet by mouth as needed for indigestion or heartburn.  . cetirizine (ZYRTEC) 10 MG chewable tablet Chew 10 mg by mouth daily.  . Cinnamon 500 MG TABS Take 2 tablets by mouth daily.    .  Cyanocobalamin (B-12) 2500 MCG SUBL Place 1 tablet under the tongue daily.  Marland Kitchen docusate sodium (COLACE) 100 MG capsule Take 100 mg by mouth daily.  Marland Kitchen ezetimibe (ZETIA) 10 MG tablet Take 1 tablet (10 mg total) by mouth daily.  . fish oil-omega-3 fatty acids 1000 MG capsule Take 2 g by mouth 3 (three) times daily.    Marland Kitchen glucosamine-chondroitin 500-400 MG tablet Take 1 tablet by mouth daily.    . Lactobacillus (PROBIOTIC ACIDOPHILUS PO) Take 1 tablet by mouth daily.  Marland Kitchen lisinopril (PRINIVIL,ZESTRIL) 20 MG tablet Take 1 tablet (20 mg total) by mouth daily. (Patient taking differently: Take 40 mg by mouth daily. )  . Lycopene 10 MG CAPS Take 1 capsule by mouth daily.    Marland Kitchen MAGNESIUM CARBONATE PO Take 375 mg by mouth daily.  . Methylcellulose, Laxative, (CITRUCEL PO) Take by mouth.  . Multiple Vitamin (MULTIVITAMIN) capsule Take 1 capsule by mouth daily.    . pantoprazole (PROTONIX) 40 MG tablet Take 1 tablet (40 mg total) by mouth daily. 30 mins before a meal   . Saw Palmetto, Serenoa repens, 1000 MG CAPS Take 2 capsules by mouth 2 (two) times daily.    . sildenafil (REVATIO) 20 MG tablet Take 20 mg by mouth daily.     Allergies:   Patient has no known allergies.   Social History   Social History  .  Marital status: Married    Spouse name: [name]  . Number of children: 2  . Years of education: N/A   Social History Main Topics  . Smoking status: Never Smoker  . Smokeless tobacco: Never Used  . Alcohol use No  . Drug use: No  . Sexual activity: Not on file   Other Topics Concern  . Not on file   Social History Narrative  . No narrative on file     Family History: The patient's family history includes Atrial fibrillation in his brother; Heart disease in his father and mother; Stroke in his father and mother. ROS:   Please see the history of present illness.    All 14 point review of systems negative except as described per history of present illness  EKGs/Labs/Other Studies Reviewed:       Recent Labs: No results found for requested labs within last 8760 hours.  Recent Lipid Panel    Component Value Date/Time   CHOL 158 12/30/2010 1038   TRIG 135.0 12/30/2010 1038   HDL 40.40 12/30/2010 1038   CHOLHDL 4 12/30/2010 1038   VLDL 27.0 12/30/2010 1038   LDLCALC 91 12/30/2010 1038   LDLDIRECT 146.6 07/03/2009 0947    Physical Exam:    VS:  BP (!) 150/80   Pulse 84   Resp 10   Ht 6\' 1"  (1.854 m)   Wt 195 lb (88.5 kg)   BMI 25.73 kg/m     Wt Readings from Last 3 Encounters:  06/02/17 195 lb (88.5 kg)  03/29/17 193 lb 1.9 oz (87.6 kg)  07/01/11 199 lb 3.2 oz (90.4 kg)     GEN:  Well nourished, well developed in no acute distress HEENT: Normal NECK: No JVD; No carotid bruits LYMPHATICS: No lymphadenopathy CARDIAC: RRR, no murmurs, no rubs, no gallops RESPIRATORY:  Clear to auscultation without rales, wheezing or rhonchi  ABDOMEN: Soft, non-tender, non-distended MUSCULOSKELETAL:  No edema; No deformity  SKIN: Warm and dry LOWER EXTREMITIES: no swelling NEUROLOGIC:  Alert and oriented x 3 PSYCHIATRIC:  Normal affect   ASSESSMENT:    1. Essential hypertension   2. Palpitations   3. Fatigue, unspecified type   4. Weakness    PLAN:    In order of problems listed above:  1. Essential hypertension: Blood pressure well controlled continue present management. 2. Palpitations: Denies having any 3. Fatigue: Doing much better from that point review 4. Weakness: Doing well   Medication Adjustments/Labs and Tests Ordered: Current medicines are reviewed at length with the patient today.  Concerns regarding medicines are outlined above.  No orders of the defined types were placed in this encounter.  Medication changes: No orders of the defined types were placed in this encounter.   Signed, Park Liter, MD, [facility] 06/03/2017 8:32 AM    [place]

## 2017-06-22 DIAGNOSIS — L219 Seborrheic dermatitis, unspecified: Secondary | ICD-10-CM | POA: Diagnosis not present

## 2017-06-22 DIAGNOSIS — L570 Actinic keratosis: Secondary | ICD-10-CM | POA: Diagnosis not present

## 2017-06-22 DIAGNOSIS — C44311 Basal cell carcinoma of skin of nose: Secondary | ICD-10-CM | POA: Diagnosis not present

## 2017-06-22 DIAGNOSIS — L578 Other skin changes due to chronic exposure to nonionizing radiation: Secondary | ICD-10-CM | POA: Diagnosis not present

## 2017-06-22 DIAGNOSIS — C4441 Basal cell carcinoma of skin of scalp and neck: Secondary | ICD-10-CM | POA: Diagnosis not present

## 2017-07-27 DIAGNOSIS — L219 Seborrheic dermatitis, unspecified: Secondary | ICD-10-CM | POA: Diagnosis not present

## 2017-07-27 DIAGNOSIS — B079 Viral wart, unspecified: Secondary | ICD-10-CM | POA: Diagnosis not present

## 2017-07-27 DIAGNOSIS — L570 Actinic keratosis: Secondary | ICD-10-CM | POA: Diagnosis not present

## 2017-08-17 DIAGNOSIS — Z1211 Encounter for screening for malignant neoplasm of colon: Secondary | ICD-10-CM | POA: Diagnosis not present

## 2017-08-17 DIAGNOSIS — Z79899 Other long term (current) drug therapy: Secondary | ICD-10-CM | POA: Diagnosis not present

## 2017-08-17 DIAGNOSIS — E785 Hyperlipidemia, unspecified: Secondary | ICD-10-CM | POA: Diagnosis not present

## 2017-08-17 DIAGNOSIS — Z125 Encounter for screening for malignant neoplasm of prostate: Secondary | ICD-10-CM | POA: Diagnosis not present

## 2017-08-17 DIAGNOSIS — Z136 Encounter for screening for cardiovascular disorders: Secondary | ICD-10-CM | POA: Diagnosis not present

## 2017-08-17 DIAGNOSIS — I129 Hypertensive chronic kidney disease with stage 1 through stage 4 chronic kidney disease, or unspecified chronic kidney disease: Secondary | ICD-10-CM | POA: Diagnosis not present

## 2017-08-17 DIAGNOSIS — Z0000 Encounter for general adult medical examination without abnormal findings: Secondary | ICD-10-CM | POA: Diagnosis not present

## 2017-08-17 DIAGNOSIS — R8290 Unspecified abnormal findings in urine: Secondary | ICD-10-CM | POA: Diagnosis not present

## 2017-08-17 DIAGNOSIS — Z1331 Encounter for screening for depression: Secondary | ICD-10-CM | POA: Diagnosis not present

## 2017-08-20 DIAGNOSIS — N390 Urinary tract infection, site not specified: Secondary | ICD-10-CM | POA: Diagnosis not present

## 2017-08-25 DIAGNOSIS — I10 Essential (primary) hypertension: Secondary | ICD-10-CM | POA: Diagnosis not present

## 2017-08-25 DIAGNOSIS — R0602 Shortness of breath: Secondary | ICD-10-CM | POA: Diagnosis not present

## 2017-08-25 DIAGNOSIS — I700 Atherosclerosis of aorta: Secondary | ICD-10-CM | POA: Diagnosis not present

## 2017-08-25 DIAGNOSIS — I712 Thoracic aortic aneurysm, without rupture: Secondary | ICD-10-CM | POA: Diagnosis not present

## 2017-08-25 DIAGNOSIS — R000 Tachycardia, unspecified: Secondary | ICD-10-CM | POA: Diagnosis not present

## 2017-08-25 DIAGNOSIS — R7989 Other specified abnormal findings of blood chemistry: Secondary | ICD-10-CM | POA: Diagnosis not present

## 2017-08-25 DIAGNOSIS — I480 Paroxysmal atrial fibrillation: Secondary | ICD-10-CM | POA: Diagnosis not present

## 2017-08-27 DIAGNOSIS — I4891 Unspecified atrial fibrillation: Secondary | ICD-10-CM | POA: Diagnosis not present

## 2017-08-27 DIAGNOSIS — N390 Urinary tract infection, site not specified: Secondary | ICD-10-CM | POA: Diagnosis not present

## 2017-08-27 DIAGNOSIS — J329 Chronic sinusitis, unspecified: Secondary | ICD-10-CM | POA: Diagnosis not present

## 2017-08-27 DIAGNOSIS — I712 Thoracic aortic aneurysm, without rupture: Secondary | ICD-10-CM | POA: Diagnosis not present

## 2017-08-27 DIAGNOSIS — R5383 Other fatigue: Secondary | ICD-10-CM | POA: Diagnosis not present

## 2017-08-27 DIAGNOSIS — E039 Hypothyroidism, unspecified: Secondary | ICD-10-CM | POA: Diagnosis not present

## 2017-08-30 ENCOUNTER — Encounter: Payer: Self-pay | Admitting: Cardiology

## 2017-08-30 ENCOUNTER — Ambulatory Visit (INDEPENDENT_AMBULATORY_CARE_PROVIDER_SITE_OTHER): Payer: Medicare Other | Admitting: Cardiology

## 2017-08-30 VITALS — BP 154/94 | HR 61 | Ht 73.0 in | Wt 192.0 lb

## 2017-08-30 DIAGNOSIS — E559 Vitamin D deficiency, unspecified: Secondary | ICD-10-CM | POA: Diagnosis not present

## 2017-08-30 DIAGNOSIS — I7121 Aneurysm of the ascending aorta, without rupture: Secondary | ICD-10-CM

## 2017-08-30 DIAGNOSIS — I10 Essential (primary) hypertension: Secondary | ICD-10-CM | POA: Diagnosis not present

## 2017-08-30 DIAGNOSIS — I712 Thoracic aortic aneurysm, without rupture: Secondary | ICD-10-CM | POA: Diagnosis not present

## 2017-08-30 DIAGNOSIS — E7800 Pure hypercholesterolemia, unspecified: Secondary | ICD-10-CM | POA: Diagnosis not present

## 2017-08-30 DIAGNOSIS — I480 Paroxysmal atrial fibrillation: Secondary | ICD-10-CM

## 2017-08-30 DIAGNOSIS — R002 Palpitations: Secondary | ICD-10-CM

## 2017-08-30 DIAGNOSIS — Z79899 Other long term (current) drug therapy: Secondary | ICD-10-CM | POA: Diagnosis not present

## 2017-08-30 DIAGNOSIS — E039 Hypothyroidism, unspecified: Secondary | ICD-10-CM | POA: Diagnosis not present

## 2017-08-30 MED ORDER — APIXABAN 5 MG PO TABS: 5.0000 mg | ORAL_TABLET | Freq: Two times a day (BID) | ORAL | 6 refills | Status: DC

## 2017-08-30 NOTE — Progress Notes (Signed)
Cardiology Office Note:    Date:  08/30/2017   ID:  LORRIS CARDUCCI, DOB 11-24-1935, MRN [id number]  PCP:  Melony Overly, MD  Cardiologist:  Jenne Campus, MD    Referring MD: Melony Overly, MD   Chief Complaint  Patient presents with  . Atrial Fibrillation  I had episode of atrial fibrillation  History of Present Illness:    Ronald Bird is a 82 y.o. male who being seen for palpitations.  So far workup has been negative.  He wear a event monitor for a month but his echocardiogram showed preserved left ventricular ejection fraction.  About 10 days ago he was running on the treadmill and then suddenly he became very weak and tired.  He had to stop he checked his heart rate was elevated and irregular.  Does have devised that allowed him to check the rhythm minutes at atrial fibrillation.  He called 911 EMS came hooking up to the monitor he was find to be in atrial fibrillation.  By the time he arrived to the emergency room he converted to sinus rhythm.  He did not have any chest pain tightness squeezing pressure burning chest but does have some shortness of breath while having atrial fibrillation.  This is the first documented episode of atrial fibrillation. As a part of In the emergency room he did have CT of his chest done to look for pulmonary emboli.  He did not have pulmonary emboli but he was fine to have a stenting aortic aneurysm measuring 4.1 cm  Past Medical History:  Diagnosis Date  . BPH (benign prostatic hyperplasia)   . DJD (degenerative joint disease)   . GERD (gastroesophageal reflux disease)   . History of colonic polyps   . Hypercholesteremia   . Hypertension     Past Surgical History:  Procedure Laterality Date  . CATARACT EXTRACTION, BILATERAL  2012   In [facility]  . MOHS SURGERY  2012   Skin Cancer removed from nose by Moh's in [place]  . SHOULDER SURGERY      Current Medications: Current Meds  Medication Sig  . aspirin 81 MG tablet  Take 81 mg by mouth daily.    . budesonide (RHINOCORT ALLERGY) 32 MCG/ACT nasal spray Place 2 sprays into both nostrils daily.  . calcium carbonate (TUMS - DOSED IN MG ELEMENTAL CALCIUM) 500 MG chewable tablet Chew 1 tablet by mouth as needed for indigestion or heartburn.  . cetirizine (ZYRTEC) 10 MG chewable tablet Chew 10 mg by mouth daily.  . Cinnamon 500 MG TABS Take 2 tablets by mouth daily.    . Cyanocobalamin (B-12) 2500 MCG SUBL Place 1 tablet under the tongue daily.  Marland Kitchen docusate sodium (COLACE) 100 MG capsule Take 100 mg by mouth daily.  Marland Kitchen ezetimibe (ZETIA) 10 MG tablet Take 1 tablet (10 mg total) by mouth daily.  . fish oil-omega-3 fatty acids 1000 MG capsule Take 2 g by mouth 3 (three) times daily.    Marland Kitchen glucosamine-chondroitin 500-400 MG tablet Take 1 tablet by mouth daily.    . Lactobacillus (PROBIOTIC ACIDOPHILUS PO) Take 1 tablet by mouth daily.  Marland Kitchen lisinopril (PRINIVIL,ZESTRIL) 20 MG tablet Take 1 tablet (20 mg total) by mouth daily. (Patient taking differently: Take 40 mg by mouth daily. )  . Lycopene 10 MG CAPS Take 1 capsule by mouth daily.    . Methylcellulose, Laxative, (CITRUCEL PO) Take by mouth.  . metoprolol tartrate (LOPRESSOR) 25 MG tablet Take 12.5 mg by mouth  2 (two) times daily.  . Multiple Vitamin (MULTIVITAMIN) capsule Take 1 capsule by mouth daily.    . pantoprazole (PROTONIX) 40 MG tablet Take 1 tablet (40 mg total) by mouth daily. 30 mins before a meal   . Saw Palmetto, Serenoa repens, 1000 MG CAPS Take 2 capsules by mouth 2 (two) times daily.    . [DISCONTINUED] METHYLCELLULOSE OP Apply to eye.     Allergies:   Patient has no known allergies.   Social History   Socioeconomic History  . Marital status: Married    Spouse name: [name]  . Number of children: 2  . Years of education: None  . Highest education level: None  Social Needs  . Financial resource strain: None  . Food insecurity - worry: None  . Food insecurity - inability: None  . Transportation  needs - medical: None  . Transportation needs - non-medical: None  Occupational History  . None  Tobacco Use  . Smoking status: Never Smoker  . Smokeless tobacco: Never Used  Substance and Sexual Activity  . Alcohol use: No  . Drug use: No  . Sexual activity: None  Other Topics Concern  . None  Social History Narrative  . None     Family History: The patient's family history includes Atrial fibrillation in his brother; Heart disease in his father and mother; Stroke in his father and mother. ROS:   Please see the history of present illness.    All 14 point review of systems negative except as described per history of present illness  EKGs/Labs/Other Studies Reviewed:      Recent Labs: No results found for requested labs within last 8760 hours.  Recent Lipid Panel    Component Value Date/Time   CHOL 158 12/30/2010 1038   TRIG 135.0 12/30/2010 1038   HDL 40.40 12/30/2010 1038   CHOLHDL 4 12/30/2010 1038   VLDL 27.0 12/30/2010 1038   LDLCALC 91 12/30/2010 1038   LDLDIRECT 146.6 07/03/2009 0947    Physical Exam:    VS:  BP (!) 154/94 (BP Location: Right Arm, Patient Position: Sitting, Cuff Size: Normal)   Pulse 61   Ht 6\' 1"  (1.854 m)   Wt 192 lb (87.1 kg)   SpO2 98%   BMI 25.33 kg/m     Wt Readings from Last 3 Encounters:  08/30/17 192 lb (87.1 kg)  06/02/17 195 lb (88.5 kg)  03/29/17 193 lb 1.9 oz (87.6 kg)     GEN:  Well nourished, well developed in no acute distress HEENT: Normal NECK: No JVD; No carotid bruits LYMPHATICS: No lymphadenopathy CARDIAC: RRR, no murmurs, no rubs, no gallops RESPIRATORY:  Clear to auscultation without rales, wheezing or rhonchi  ABDOMEN: Soft, non-tender, non-distended MUSCULOSKELETAL:  No edema; No deformity  SKIN: Warm and dry LOWER EXTREMITIES: no swelling NEUROLOGIC:  Alert and oriented x 3 PSYCHIATRIC:  Normal affect   ASSESSMENT:    1. Essential hypertension   2. Paroxysmal atrial fibrillation (HCC)   3.  Palpitations   4. HYPERCHOLESTEROLEMIA    PLAN:    In order of problems listed above:  1. First documented episode of atrial fibrillation: His chads 2 vascular score equals 2 he needs to be anticoagulated.  All laboratory tests were fine.  He just had a stool guaiac checked last week which was negative.  Will stop aspirin we will put him on Eliquis 5 mg twice a day.  He is on beta-blocker which I will continue. 2. Essential hypertension: Blood pressure  appears to be mildly elevated.  He is on beta-blocker which should help.  We will continue monitoring. 3. Palpitations: Finally we have diagnosis he does have proximal atrial fibrillation as we anticipated.  Appropriate therapy will be initiated.  I do not think he needs an antiarrhythmic for the moment.  He had a rare usually self terminate an episode of atrial fibrillation. 4. Upper cholesterolemia: We will continue present medications. 5. Ascending aortic aneurysm measuring 4.1 cm.  Not enough to intervene.  We will continue monitoring a year from now he will required another CT of his chest with contrast to look at the size of the aneurysm.  He will be try to control his blood pressure well and also avoid isovolumetric exercises. Since he had episode of atrial fibrillation while exercising I will schedule him to have a stress test.  Will wait for a few weeks before have it done.  He does have some cold-like symptoms we will wait for him to recover before proceeding with stress testing.   Medication Adjustments/Labs and Tests Ordered: Current medicines are reviewed at length with the patient today.  Concerns regarding medicines are outlined above.  No orders of the defined types were placed in this encounter.  Medication changes: No orders of the defined types were placed in this encounter.   Signed, Park Liter, MD, [facility] 08/30/2017 10:23 AM    [place]

## 2017-08-30 NOTE — Patient Instructions (Signed)
Medication Instructions:  Your physician has recommended you make the following change in your medication:  1) Stop your Asprin 2) Start Eliquis 5 mg 1 tablet 2 times daily  Labwork: None ordered  Testing/Procedures: Your physician has requested that you have a stress echocardiogram. For further information please visit [email or web address]. Please follow instruction sheet as given.  You will be scheduled at [facility]. [place] office will call you with your appointment date and time. Nothing to eat or drink after midnight the day before your test. Do not take your medications the morning of your test. Dress comfortable with walking shoes.  Follow-Up: Your physician recommends that you schedule a follow-up appointment in: 1 month with Dr. Agustin Cree   Any Other Special Instructions Will Be Listed Below (If Applicable).     If you need a refill on your cardiac medications before your next appointment, please call your pharmacy.

## 2017-08-31 ENCOUNTER — Telehealth: Payer: Self-pay

## 2017-08-31 DIAGNOSIS — J329 Chronic sinusitis, unspecified: Secondary | ICD-10-CM | POA: Diagnosis not present

## 2017-08-31 DIAGNOSIS — E039 Hypothyroidism, unspecified: Secondary | ICD-10-CM | POA: Diagnosis not present

## 2017-08-31 DIAGNOSIS — I129 Hypertensive chronic kidney disease with stage 1 through stage 4 chronic kidney disease, or unspecified chronic kidney disease: Secondary | ICD-10-CM | POA: Diagnosis not present

## 2017-08-31 DIAGNOSIS — E559 Vitamin D deficiency, unspecified: Secondary | ICD-10-CM | POA: Diagnosis not present

## 2017-08-31 DIAGNOSIS — N182 Chronic kidney disease, stage 2 (mild): Secondary | ICD-10-CM | POA: Diagnosis not present

## 2017-08-31 DIAGNOSIS — E785 Hyperlipidemia, unspecified: Secondary | ICD-10-CM | POA: Diagnosis not present

## 2017-08-31 NOTE — Telephone Encounter (Signed)
Informed patient of his appointment for stress echo at [facility] on 02/05 arrival time at 8:30. Instructions for prep were reviewed again as well as an email through [facility] was sent.

## 2017-09-14 DIAGNOSIS — I480 Paroxysmal atrial fibrillation: Secondary | ICD-10-CM | POA: Diagnosis not present

## 2017-09-14 DIAGNOSIS — R002 Palpitations: Secondary | ICD-10-CM | POA: Diagnosis not present

## 2017-09-15 NOTE — Addendum Note (Signed)
Addended by: Aleatha Borer on: 09/15/2017 08:25 AM   Modules accepted: Orders

## 2017-09-21 ENCOUNTER — Other Ambulatory Visit: Payer: Self-pay

## 2017-09-21 MED ORDER — METOPROLOL TARTRATE 25 MG PO TABS: 12.5000 mg | ORAL_TABLET | Freq: Two times a day (BID) | ORAL | 6 refills | Status: DC

## 2017-09-30 ENCOUNTER — Ambulatory Visit (INDEPENDENT_AMBULATORY_CARE_PROVIDER_SITE_OTHER): Payer: Medicare Other | Admitting: Cardiology

## 2017-09-30 ENCOUNTER — Encounter: Payer: Self-pay | Admitting: Cardiology

## 2017-09-30 VITALS — BP 140/90 | HR 57 | Ht 73.0 in | Wt 193.0 lb

## 2017-09-30 DIAGNOSIS — I10 Essential (primary) hypertension: Secondary | ICD-10-CM | POA: Diagnosis not present

## 2017-09-30 DIAGNOSIS — I712 Thoracic aortic aneurysm, without rupture: Secondary | ICD-10-CM | POA: Diagnosis not present

## 2017-09-30 DIAGNOSIS — I480 Paroxysmal atrial fibrillation: Secondary | ICD-10-CM | POA: Diagnosis not present

## 2017-09-30 DIAGNOSIS — I7121 Aneurysm of the ascending aorta, without rupture: Secondary | ICD-10-CM

## 2017-09-30 NOTE — Patient Instructions (Signed)
Medication Instructions:  Your physician recommends that you continue on your current medications as directed. Please refer to the Current Medication list given to you today.  Labwork: None ordered  Testing/Procedures: None ordered  Follow-Up: Your physician recommends that you schedule a follow-up appointment in: 5 months with Dr. Agustin Cree in [place]   Any Other Special Instructions Will Be Listed Below (If Applicable).     If you need a refill on your cardiac medications before your next appointment, please call your pharmacy.

## 2017-09-30 NOTE — Progress Notes (Signed)
Cardiology Office Note:    Date:  09/30/2017   ID:  GRIFF BADLEY, DOB [birth year], MRN [id number]  PCP:  Melony Overly, MD  Cardiologist:  Jenne Campus, MD    Referring MD: Melony Overly, MD   Chief Complaint  Patient presents with  . Follow-up    A-fib  Doing well  History of Present Illness:    Ronald Bird is a 82 y.o. male with paroxysmal atrial fibrillation.  He is doing well on today's visit with basically spent talking about the fact that he does have proximal atrial fibrillation likely he is anticoagulated which I will continue.  He does not feel when he got episode of atrial fibrillation but feels just weak and tired.  We talked about what to do if he will develop another episode of atrial fibrillation.  Since he is anticoagulated we can simply wait and see if he converted back to normal rhythm.  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  Past Medical History:  Diagnosis Date  . BPH (benign prostatic hyperplasia)   . DJD (degenerative joint disease)   . GERD (gastroesophageal reflux disease)   . History of colonic polyps   . Hypercholesteremia   . Hypertension     Past Surgical History:  Procedure Laterality Date  . CATARACT EXTRACTION, BILATERAL  2012   In [facility]  . MOHS SURGERY  2012   Skin Cancer removed from nose by Moh's in   . SHOULDER SURGERY      Current Medications: Current Meds  Medication Sig  . apixaban (ELIQUIS) 5 MG TABS tablet Take 1 tablet (5 mg total) by mouth 2 (two) times daily.  . budesonide (RHINOCORT ALLERGY) 32 MCG/ACT nasal spray Place 2 sprays into both nostrils daily.  . calcium carbonate (TUMS - DOSED IN MG ELEMENTAL CALCIUM) 500 MG chewable tablet Chew 1 tablet by mouth as needed for indigestion or heartburn.  . cetirizine (ZYRTEC) 10 MG chewable tablet Chew 10 mg by mouth daily.  . Cinnamon 500 MG TABS Take 2  tablets by mouth daily.    . Cyanocobalamin (B-12) 2500 MCG SUBL Place 1 tablet under the tongue daily.  Marland Kitchen docusate sodium (COLACE) 100 MG capsule Take 100 mg by mouth daily.  Marland Kitchen ezetimibe (ZETIA) 10 MG tablet Take 1 tablet (10 mg total) by mouth daily.  . fish oil-omega-3 fatty acids 1000 MG capsule Take 2 g by mouth 3 (three) times daily.    Marland Kitchen glucosamine-chondroitin 500-400 MG tablet Take 1 tablet by mouth daily.    . Lactobacillus (PROBIOTIC ACIDOPHILUS PO) Take 1 tablet by mouth daily.  Marland Kitchen levothyroxine (SYNTHROID, LEVOTHROID) 25 MCG tablet Take 25 mcg by mouth daily before breakfast.  . lisinopril (PRINIVIL,ZESTRIL) 40 MG tablet Take 40 mg by mouth daily.  . Lycopene 10 MG CAPS Take 1 capsule by mouth daily.    . Methylcellulose, Laxative, (CITRUCEL PO) Take by mouth.  . metoprolol tartrate (LOPRESSOR) 25 MG tablet Take 0.5 tablets (12.5 mg total) by mouth 2 (two) times daily.  . Multiple Vitamin (MULTIVITAMIN) capsule Take 1 capsule by mouth daily.    . NON FORMULARY Muscle Cramp Pain Reliever  . pantoprazole (PROTONIX) 40 MG tablet Take 1 tablet (40 mg total) by mouth daily. 30 mins before a meal   . Saw Palmetto, Serenoa repens, 1000 MG CAPS Take 2 capsules by  mouth 2 (two) times daily.    . Vitamin D, Ergocalciferol, (DRISDOL) 50000 units CAPS capsule Take 50,000 Units by mouth every 7 (seven) days.  . [DISCONTINUED] lisinopril (PRINIVIL,ZESTRIL) 20 MG tablet Take 1 tablet (20 mg total) by mouth daily. (Patient taking differently: Take 40 mg by mouth daily. )     Allergies:   Patient has no known allergies.   Social History   Socioeconomic History  . Marital status: Married    Spouse name: Ronald Bird  . Number of children: 2  . Years of education: None  . Highest education level: None  Social Needs  . Financial resource strain: None  . Food insecurity - worry: None  . Food insecurity - inability: None  . Transportation needs - medical: None  . Transportation needs - non-medical:  None  Occupational History  . None  Tobacco Use  . Smoking status: Never Smoker  . Smokeless tobacco: Never Used  Substance and Sexual Activity  . Alcohol use: No  . Drug use: No  . Sexual activity: None  Other Topics Concern  . None  Social History Narrative  . None     Family History: The patient's family history includes Atrial fibrillation in his brother; Heart disease in his father and mother; Stroke in his father and mother. ROS:   Please see the history of present illness.    All 14 point review of systems negative except as described per history of present illness  EKGs/Labs/Other Studies Reviewed:      Recent Labs: No results found for requested labs within last 8760 hours.  Recent Lipid Panel    Component Value Date/Time   CHOL 158 12/30/2010 1038   TRIG 135.0 12/30/2010 1038   HDL 40.40 12/30/2010 1038   CHOLHDL 4 12/30/2010 1038   VLDL 27.0 12/30/2010 1038   LDLCALC 91 12/30/2010 1038   LDLDIRECT 146.6 07/03/2009 0947    Physical Exam:    VS:  BP 140/90 (BP Location: Right Arm, Patient Position: Sitting, Cuff Size: Normal)   Pulse (!) 57   Ht 6\' 1"  (1.854 m)   Wt 193 lb (87.5 kg)   SpO2 98%   BMI 25.46 kg/m     Wt Readings from Last 3 Encounters:  09/30/17 193 lb (87.5 kg)  08/30/17 192 lb (87.1 kg)  06/02/17 195 lb (88.5 kg)     GEN:  Well nourished, well developed in no acute distress HEENT: Normal NECK: No JVD; No carotid bruits LYMPHATICS: No lymphadenopathy CARDIAC: RRR, no murmurs, no rubs, no gallops RESPIRATORY:  Clear to auscultation without rales, wheezing or rhonchi  ABDOMEN: Soft, non-tender, non-distended MUSCULOSKELETAL:  No edema; No deformity  SKIN: Warm and dry LOWER EXTREMITIES: no swelling NEUROLOGIC:  Alert and oriented x 3 PSYCHIATRIC:  Normal affect   ASSESSMENT:    1. Ascending aortic aneurysm ([place])   2. Essential hypertension   3. Paroxysmal atrial fibrillation (HCC)    PLAN:    In order of problems  listed above:  1. Ascending  aortic aneurysm: Only mild dilatation we will continue monitoring. 2. Essential hypertension: Well controlled continue present management. 3. Paroxysmal atrial fibrillation: Discussion as above stable now continue present management which includes anticoagulation his chads 2 Vascor equals 2   Medication Adjustments/Labs and Tests Ordered: Current medicines are reviewed at length with the patient today.  Concerns regarding medicines are outlined above.  No orders of the defined types were placed in this encounter.  Medication changes: No orders of the defined  types were placed in this encounter.   Signed, Park Liter, MD, [facility] 09/30/2017 12:35 PM    [place]

## 2017-10-06 ENCOUNTER — Telehealth: Payer: Self-pay | Admitting: Cardiology

## 2017-10-06 DIAGNOSIS — I480 Paroxysmal atrial fibrillation: Secondary | ICD-10-CM

## 2017-10-06 NOTE — Telephone Encounter (Signed)
His BP is 208/117 pulse is 63

## 2017-10-06 NOTE — Telephone Encounter (Signed)
Patient's blood pressure from last night through this morning have been 181/105, 199/108, 208/117, 159/91, 164/96, 167/97. Patient takes lisinopril 40 mg at night. Reviewed blood pressures with Dr. Agustin Cree, advised for patient to take half dose of lisinopril now and the other half this evening. Advised patient to take lisinopril 20 mg (0.5 tablet) in the morning and 20 mg in the evening for the next couple of days and return call to advise if blood pressures improve or did not improve. Patient verbalized understanding. No further questions.

## 2017-10-08 MED ORDER — METOPROLOL TARTRATE 25 MG PO TABS: 25.0000 mg | ORAL_TABLET | Freq: Two times a day (BID) | ORAL | 3 refills | Status: DC

## 2017-10-08 MED ORDER — HYDROCHLOROTHIAZIDE 12.5 MG PO CAPS: 12.5000 mg | ORAL_CAPSULE | Freq: Every day | ORAL | 11 refills | Status: DC

## 2017-10-08 NOTE — Telephone Encounter (Signed)
Patient returned call stating his blood pressure has not changed at all since taking lisinopril 20 mg in the morning and 20 mg in the evening. BP from yesterday through this morning: 200/108, 161/88, 190/110, 148/91.   Reviewed with Dr. Geraldo Pitter. Advised to increase metoprolol to 25 mg twice daily. Take lisinopril 40 mg in the morning instead of the evening. Add hydrochlorothiazide 12.5 mg daily. Patient verbalized understanding of medication adjustments. Sending to [facility] per patient request.  Patient coming for a nurse visit for pulse, blood pressure check and BMP next Wednesday at 11 am. Patient verbalized understanding. No further questions.

## 2017-10-08 NOTE — Addendum Note (Signed)
Addended by: Warner Mccreedy E on: 10/08/2017 12:07 PM   Modules accepted: Orders

## 2017-10-13 ENCOUNTER — Ambulatory Visit (INDEPENDENT_AMBULATORY_CARE_PROVIDER_SITE_OTHER): Payer: Medicare Other | Admitting: Cardiology

## 2017-10-13 VITALS — BP 126/72 | HR 60

## 2017-10-13 DIAGNOSIS — I10 Essential (primary) hypertension: Secondary | ICD-10-CM | POA: Diagnosis not present

## 2017-10-13 DIAGNOSIS — I480 Paroxysmal atrial fibrillation: Secondary | ICD-10-CM | POA: Diagnosis not present

## 2017-10-13 NOTE — Progress Notes (Signed)
Patient in the office after adjustment of blood pressure medications. He denies any complication at this time with his current blood pressure readings. Reviewed with Dr. Geraldo Pitter no changes will be made at this time.

## 2017-10-14 LAB — BASIC METABOLIC PANEL
BUN/Creatinine Ratio: 16 (ref 10–24)
BUN: 18 mg/dL (ref 8–27)
CALCIUM: 8.9 mg/dL (ref 8.6–10.2)
CO2: 25 mmol/L (ref 20–29)
CREATININE: 1.12 mg/dL (ref 0.76–1.27)
Chloride: 98 mmol/L (ref 96–106)
GFR calc Af Amer: 72 mL/min/{1.73_m2} (ref >59)
GFR, EST NON AFRICAN AMERICAN: 62 mL/min/{1.73_m2} (ref >59)
Glucose: 98 mg/dL (ref 65–99)
POTASSIUM: 4.7 mmol/L (ref 3.5–5.2)
Sodium: 137 mmol/L (ref 134–144)

## 2017-10-18 DIAGNOSIS — E559 Vitamin D deficiency, unspecified: Secondary | ICD-10-CM | POA: Diagnosis not present

## 2017-10-18 DIAGNOSIS — E039 Hypothyroidism, unspecified: Secondary | ICD-10-CM | POA: Diagnosis not present

## 2018-01-28 ENCOUNTER — Other Ambulatory Visit: Payer: Self-pay | Admitting: *Deleted

## 2018-01-28 MED ORDER — EZETIMIBE 10 MG PO TABS: 10.0000 mg | ORAL_TABLET | Freq: Every day | ORAL | 2 refills | Status: DC

## 2018-01-28 MED ORDER — LISINOPRIL 40 MG PO TABS: 40.0000 mg | ORAL_TABLET | Freq: Every day | ORAL | 2 refills | Status: DC

## 2018-01-28 NOTE — Telephone Encounter (Signed)
Refills for zetia and lisinopril sent to [facility] in [place].

## 2018-03-01 DIAGNOSIS — E782 Mixed hyperlipidemia: Secondary | ICD-10-CM | POA: Diagnosis not present

## 2018-03-01 DIAGNOSIS — Z79899 Other long term (current) drug therapy: Secondary | ICD-10-CM | POA: Diagnosis not present

## 2018-03-01 DIAGNOSIS — E039 Hypothyroidism, unspecified: Secondary | ICD-10-CM | POA: Diagnosis not present

## 2018-03-01 DIAGNOSIS — K219 Gastro-esophageal reflux disease without esophagitis: Secondary | ICD-10-CM | POA: Diagnosis not present

## 2018-03-01 DIAGNOSIS — E559 Vitamin D deficiency, unspecified: Secondary | ICD-10-CM | POA: Diagnosis not present

## 2018-03-01 DIAGNOSIS — I10 Essential (primary) hypertension: Secondary | ICD-10-CM | POA: Diagnosis not present

## 2018-03-16 DIAGNOSIS — C44311 Basal cell carcinoma of skin of nose: Secondary | ICD-10-CM | POA: Diagnosis not present

## 2018-03-16 DIAGNOSIS — L578 Other skin changes due to chronic exposure to nonionizing radiation: Secondary | ICD-10-CM | POA: Diagnosis not present

## 2018-03-16 DIAGNOSIS — L570 Actinic keratosis: Secondary | ICD-10-CM | POA: Diagnosis not present

## 2018-03-16 DIAGNOSIS — L821 Other seborrheic keratosis: Secondary | ICD-10-CM | POA: Diagnosis not present

## 2018-03-16 DIAGNOSIS — L219 Seborrheic dermatitis, unspecified: Secondary | ICD-10-CM | POA: Diagnosis not present

## 2018-03-17 ENCOUNTER — Ambulatory Visit (INDEPENDENT_AMBULATORY_CARE_PROVIDER_SITE_OTHER): Payer: Medicare Other | Admitting: Cardiology

## 2018-03-17 ENCOUNTER — Encounter: Payer: Self-pay | Admitting: Cardiology

## 2018-03-17 VITALS — BP 136/78 | HR 58 | Ht 73.0 in | Wt 196.2 lb

## 2018-03-17 DIAGNOSIS — I10 Essential (primary) hypertension: Secondary | ICD-10-CM | POA: Diagnosis not present

## 2018-03-17 DIAGNOSIS — I712 Thoracic aortic aneurysm, without rupture: Secondary | ICD-10-CM | POA: Diagnosis not present

## 2018-03-17 DIAGNOSIS — I7121 Aneurysm of the ascending aorta, without rupture: Secondary | ICD-10-CM

## 2018-03-17 DIAGNOSIS — E7800 Pure hypercholesterolemia, unspecified: Secondary | ICD-10-CM | POA: Diagnosis not present

## 2018-03-17 DIAGNOSIS — I480 Paroxysmal atrial fibrillation: Secondary | ICD-10-CM | POA: Diagnosis not present

## 2018-03-17 NOTE — Progress Notes (Signed)
Cardiology Office Note:    Date:  03/17/2018   ID:  Ronald Bird, DOB 1935/11/20, MRN [id number]  PCP:  Street, Sharon Mt, MD  Cardiologist:  Jenne Campus, MD    Referring MD: Street, Sharon Mt, *   Chief Complaint  Patient presents with  . Follow-up    5 month follow up  Doing well  History of Present Illness:    Ronald Bird is a 82 y.o. male with paroxysmal atrial fibrillation, ascending aortic aneurysm overall seems to be doing well denies having palpitations still very active goes to gym on the regular basis and walking the regular basis.  Past Medical History:  Diagnosis Date  . BPH (benign prostatic hyperplasia)   . DJD (degenerative joint disease)   . GERD (gastroesophageal reflux disease)   . History of colonic polyps   . Hypercholesteremia   . Hypertension     Past Surgical History:  Procedure Laterality Date  . CATARACT EXTRACTION, BILATERAL  2012   In [facility]  . MOHS SURGERY  2012   Skin Cancer removed from nose by Moh's in [place]  . SHOULDER SURGERY      Current Medications: Current Meds  Medication Sig  . apixaban (ELIQUIS) 5 MG TABS tablet Take 1 tablet (5 mg total) by mouth 2 (two) times daily.  . budesonide (RHINOCORT ALLERGY) 32 MCG/ACT nasal spray Place 2 sprays into both nostrils daily.  . calcium carbonate (TUMS - DOSED IN MG ELEMENTAL CALCIUM) 500 MG chewable tablet Chew 1 tablet by mouth as needed for indigestion or heartburn.  . cetirizine (ZYRTEC) 10 MG chewable tablet Chew 10 mg by mouth daily.  . Cinnamon 500 MG TABS Take 2 tablets by mouth daily.    . Cyanocobalamin (B-12) 2500 MCG SUBL Place 1 tablet under the tongue daily.  Marland Kitchen docusate sodium (COLACE) 100 MG capsule Take 100 mg by mouth daily.  Marland Kitchen ezetimibe (ZETIA) 10 MG tablet Take 1 tablet (10 mg total) by mouth daily.  . fish oil-omega-3 fatty acids 1000 MG capsule Take 2 g by mouth 3 (three) times daily.    . fluconazole (DIFLUCAN) 200 MG tablet Take 200 mg  by mouth once a week. On Wednesday at lunch as needed.  Marland Kitchen glucosamine-chondroitin 500-400 MG tablet Take 1 tablet by mouth daily.    . hydrochlorothiazide (HYDRODIURIL) 12.5 MG tablet Take 12.5 mg by mouth daily.  . Lactobacillus (PROBIOTIC ACIDOPHILUS PO) Take 1 tablet by mouth daily.  Marland Kitchen levothyroxine (SYNTHROID, LEVOTHROID) 25 MCG tablet Take 25 mcg by mouth daily before breakfast.  . lisinopril (PRINIVIL,ZESTRIL) 40 MG tablet Take 1 tablet (40 mg total) by mouth daily.  . Lycopene 10 MG CAPS Take 1 capsule by mouth daily.    . Methylcellulose, Laxative, (CITRUCEL PO) Take by mouth.  . metoprolol tartrate (LOPRESSOR) 25 MG tablet Take 25 mg by mouth 2 (two) times daily.  . Multiple Vitamin (MULTIVITAMIN) capsule Take 1 capsule by mouth daily.    . NON FORMULARY Muscle Cramp Pain Reliever  . nystatin cream (MYCOSTATIN) Apply 1 application topically 2 (two) times daily.  . pantoprazole (PROTONIX) 40 MG tablet Take 1 tablet (40 mg total) by mouth daily. 30 mins before a meal   . Saw Palmetto, Serenoa repens, 1000 MG CAPS Take 2 capsules by mouth 2 (two) times daily.    Marland Kitchen triamcinolone cream (KENALOG) 0.1 % Apply 1 application topically 2 (two) times daily.  . Vitamin D, Ergocalciferol, (DRISDOL) 50000 units CAPS capsule Take 50,000 Units  by mouth every 7 (seven) days.     Allergies:   Patient has no known allergies.   Social History   Socioeconomic History  . Marital status: Married    Spouse name: [name]  . Number of children: 2  . Years of education: Not on file  . Highest education level: Not on file  Occupational History  . Not on file  Social Needs  . Financial resource strain: Not on file  . Food insecurity:    Worry: Not on file    Inability: Not on file  . Transportation needs:    Medical: Not on file    Non-medical: Not on file  Tobacco Use  . Smoking status: Never Smoker  . Smokeless tobacco: Never Used  Substance and Sexual Activity  . Alcohol use: No  . Drug use:  No  . Sexual activity: Not on file  Lifestyle  . Physical activity:    Days per week: Not on file    Minutes per session: Not on file  . Stress: Not on file  Relationships  . Social connections:    Talks on phone: Not on file    Gets together: Not on file    Attends religious service: Not on file    Active member of club or organization: Not on file    Attends meetings of clubs or organizations: Not on file    Relationship status: Not on file  Other Topics Concern  . Not on file  Social History Narrative  . Not on file     Family History: The patient's family history includes Atrial fibrillation in his brother; Heart disease in his father and mother; Stroke in his father and mother. ROS:   Please see the history of present illness.    All 14 point review of systems negative except as described per history of present illness  EKGs/Labs/Other Studies Reviewed:      Recent Labs: 10/13/2017: BUN 18; Creatinine, Ser 1.12; Potassium 4.7; Sodium 137  Recent Lipid Panel    Component Value Date/Time   CHOL 158 12/30/2010 1038   TRIG 135.0 12/30/2010 1038   HDL 40.40 12/30/2010 1038   CHOLHDL 4 12/30/2010 1038   VLDL 27.0 12/30/2010 1038   LDLCALC 91 12/30/2010 1038   LDLDIRECT 146.6 07/03/2009 0947    Physical Exam:    VS:  BP 136/78 (BP Location: Right Arm, Patient Position: Sitting, Cuff Size: Normal)   Pulse (!) 58   Ht 6\' 1"  (1.854 m)   Wt 196 lb 3.2 oz (89 kg)   SpO2 98%   BMI 25.89 kg/m     Wt Readings from Last 3 Encounters:  03/17/18 196 lb 3.2 oz (89 kg)  09/30/17 193 lb (87.5 kg)  08/30/17 192 lb (87.1 kg)     GEN:  Well nourished, well developed in no acute distress HEENT: Normal NECK: No JVD; No carotid bruits LYMPHATICS: No lymphadenopathy CARDIAC: RRR, no murmurs, no rubs, no gallops RESPIRATORY:  Clear to auscultation without rales, wheezing or rhonchi  ABDOMEN: Soft, non-tender, non-distended MUSCULOSKELETAL:  No edema; No deformity  SKIN:  Warm and dry LOWER EXTREMITIES: no swelling NEUROLOGIC:  Alert and oriented x 3 PSYCHIATRIC:  Normal affect   ASSESSMENT:    1. Paroxysmal atrial fibrillation (HCC)   2. Essential hypertension   3. Ascending aortic aneurysm ([place])   4. HYPERCHOLESTEROLEMIA    PLAN:    In order of problems listed above:  1. Paroxysmal atrial fibrillation anticoagulated which I will  continue. Essential hypertension blood pressure well controlled continue present managed .  Ascending aortic aneurysm: We will follow-up next time when I see him which will be in 6 months Hypercholesterolemia: Followed by internal medicine team.  Overall doing well with atrial fibrillation no recurrences of arrhythmia anticoagulated which I will continue.   Medication Adjustments/Labs and Tests Ordered: Current medicines are reviewed at length with the patient today.  Concerns regarding medicines are outlined above.  No orders of the defined types were placed in this encounter.  Medication changes: No orders of the defined types were placed in this encounter.   Signed, Park Liter, MD, [facility] 03/17/2018 11:24 AM    [place]

## 2018-03-17 NOTE — Patient Instructions (Signed)

## 2018-03-24 ENCOUNTER — Other Ambulatory Visit: Payer: Self-pay

## 2018-03-24 MED ORDER — APIXABAN 5 MG PO TABS: 5.0000 mg | ORAL_TABLET | Freq: Two times a day (BID) | ORAL | 2 refills | Status: DC

## 2018-04-12 DIAGNOSIS — L219 Seborrheic dermatitis, unspecified: Secondary | ICD-10-CM | POA: Diagnosis not present

## 2018-04-12 DIAGNOSIS — L821 Other seborrheic keratosis: Secondary | ICD-10-CM | POA: Diagnosis not present

## 2018-04-12 DIAGNOSIS — L820 Inflamed seborrheic keratosis: Secondary | ICD-10-CM | POA: Diagnosis not present

## 2018-04-25 DIAGNOSIS — Z23 Encounter for immunization: Secondary | ICD-10-CM | POA: Diagnosis not present

## 2018-06-30 DIAGNOSIS — J309 Allergic rhinitis, unspecified: Secondary | ICD-10-CM | POA: Diagnosis not present

## 2018-06-30 DIAGNOSIS — L2084 Intrinsic (allergic) eczema: Secondary | ICD-10-CM | POA: Diagnosis not present

## 2018-07-05 DIAGNOSIS — L2084 Intrinsic (allergic) eczema: Secondary | ICD-10-CM | POA: Diagnosis not present

## 2018-07-05 DIAGNOSIS — J310 Chronic rhinitis: Secondary | ICD-10-CM | POA: Diagnosis not present

## 2018-07-05 DIAGNOSIS — L309 Dermatitis, unspecified: Secondary | ICD-10-CM | POA: Diagnosis not present

## 2018-07-05 DIAGNOSIS — R21 Rash and other nonspecific skin eruption: Secondary | ICD-10-CM | POA: Diagnosis not present

## 2018-07-18 DIAGNOSIS — R430 Anosmia: Secondary | ICD-10-CM | POA: Diagnosis not present

## 2018-07-18 DIAGNOSIS — J0100 Acute maxillary sinusitis, unspecified: Secondary | ICD-10-CM | POA: Diagnosis not present

## 2018-07-18 DIAGNOSIS — L2084 Intrinsic (allergic) eczema: Secondary | ICD-10-CM | POA: Diagnosis not present

## 2018-07-18 DIAGNOSIS — J310 Chronic rhinitis: Secondary | ICD-10-CM | POA: Diagnosis not present

## 2018-07-18 DIAGNOSIS — R21 Rash and other nonspecific skin eruption: Secondary | ICD-10-CM | POA: Diagnosis not present

## 2018-08-23 DIAGNOSIS — R21 Rash and other nonspecific skin eruption: Secondary | ICD-10-CM | POA: Diagnosis not present

## 2018-08-23 DIAGNOSIS — L2084 Intrinsic (allergic) eczema: Secondary | ICD-10-CM | POA: Diagnosis not present

## 2018-08-23 DIAGNOSIS — J310 Chronic rhinitis: Secondary | ICD-10-CM | POA: Diagnosis not present

## 2018-09-06 DIAGNOSIS — I209 Angina pectoris, unspecified: Secondary | ICD-10-CM | POA: Diagnosis not present

## 2018-09-06 DIAGNOSIS — R0789 Other chest pain: Secondary | ICD-10-CM | POA: Diagnosis not present

## 2018-09-06 DIAGNOSIS — R079 Chest pain, unspecified: Secondary | ICD-10-CM | POA: Diagnosis not present

## 2018-09-07 DIAGNOSIS — R079 Chest pain, unspecified: Secondary | ICD-10-CM | POA: Diagnosis not present

## 2018-09-07 DIAGNOSIS — I209 Angina pectoris, unspecified: Secondary | ICD-10-CM | POA: Diagnosis not present

## 2018-09-08 ENCOUNTER — Inpatient Hospital Stay (HOSPITAL_COMMUNITY)
Admission: AD | Admit: 2018-09-08 | Discharge: 2018-09-13 | DRG: 247 | Disposition: A | Discharge status: Home / Self Care | Payer: Medicare Other | Source: Other Acute Inpatient Hospital | Attending: Cardiology | Admitting: Cardiology

## 2018-09-08 ENCOUNTER — Encounter (HOSPITAL_COMMUNITY): Payer: Self-pay | Admitting: General Practice

## 2018-09-08 ENCOUNTER — Other Ambulatory Visit: Payer: Self-pay

## 2018-09-08 DIAGNOSIS — Z7901 Long term (current) use of anticoagulants: Secondary | ICD-10-CM | POA: Diagnosis not present

## 2018-09-08 DIAGNOSIS — K219 Gastro-esophageal reflux disease without esophagitis: Secondary | ICD-10-CM | POA: Diagnosis not present

## 2018-09-08 DIAGNOSIS — Z8249 Family history of ischemic heart disease and other diseases of the circulatory system: Secondary | ICD-10-CM

## 2018-09-08 DIAGNOSIS — Z79890 Hormone replacement therapy (postmenopausal): Secondary | ICD-10-CM

## 2018-09-08 DIAGNOSIS — I440 Atrioventricular block, first degree: Secondary | ICD-10-CM | POA: Diagnosis present

## 2018-09-08 DIAGNOSIS — I10 Essential (primary) hypertension: Secondary | ICD-10-CM | POA: Diagnosis not present

## 2018-09-08 DIAGNOSIS — Z85828 Personal history of other malignant neoplasm of skin: Secondary | ICD-10-CM

## 2018-09-08 DIAGNOSIS — E7800 Pure hypercholesterolemia, unspecified: Secondary | ICD-10-CM | POA: Diagnosis not present

## 2018-09-08 DIAGNOSIS — I25110 Atherosclerotic heart disease of native coronary artery with unstable angina pectoris: Principal | ICD-10-CM | POA: Diagnosis present

## 2018-09-08 DIAGNOSIS — E785 Hyperlipidemia, unspecified: Secondary | ICD-10-CM | POA: Diagnosis present

## 2018-09-08 DIAGNOSIS — I712 Thoracic aortic aneurysm, without rupture: Secondary | ICD-10-CM | POA: Diagnosis not present

## 2018-09-08 DIAGNOSIS — I200 Unstable angina: Secondary | ICD-10-CM

## 2018-09-08 DIAGNOSIS — I209 Angina pectoris, unspecified: Secondary | ICD-10-CM | POA: Diagnosis not present

## 2018-09-08 DIAGNOSIS — Z955 Presence of coronary angioplasty implant and graft: Secondary | ICD-10-CM

## 2018-09-08 DIAGNOSIS — I361 Nonrheumatic tricuspid (valve) insufficiency: Secondary | ICD-10-CM | POA: Diagnosis not present

## 2018-09-08 DIAGNOSIS — I480 Paroxysmal atrial fibrillation: Secondary | ICD-10-CM | POA: Diagnosis present

## 2018-09-08 DIAGNOSIS — Z823 Family history of stroke: Secondary | ICD-10-CM

## 2018-09-08 DIAGNOSIS — R079 Chest pain, unspecified: Secondary | ICD-10-CM | POA: Diagnosis not present

## 2018-09-08 DIAGNOSIS — Z79899 Other long term (current) drug therapy: Secondary | ICD-10-CM | POA: Diagnosis not present

## 2018-09-08 DIAGNOSIS — E039 Hypothyroidism, unspecified: Secondary | ICD-10-CM | POA: Diagnosis present

## 2018-09-08 HISTORY — DX: Atherosclerotic heart disease of native coronary artery without angina pectoris: I25.10

## 2018-09-08 MED ORDER — LISINOPRIL 40 MG PO TABS
40.0000 mg | ORAL_TABLET | Freq: Every day | ORAL | Status: DC
  Administered 2018-09-09 – 2018-09-13 (×5): 40 mg via ORAL
  Filled 2018-09-08 (×5): qty 1

## 2018-09-08 MED ORDER — AMLODIPINE BESYLATE 10 MG PO TABS
10.0000 mg | ORAL_TABLET | Freq: Every day | ORAL | Status: DC
  Administered 2018-09-09 – 2018-09-13 (×5): 10 mg via ORAL
  Filled 2018-09-08 (×5): qty 1

## 2018-09-08 MED ORDER — SODIUM CHLORIDE 0.9% FLUSH: 3.0000 mL | INTRAVENOUS | Status: DC | PRN

## 2018-09-08 MED ORDER — EZETIMIBE 10 MG PO TABS
10.0000 mg | ORAL_TABLET | Freq: Every day | ORAL | Status: DC
  Administered 2018-09-09 – 2018-09-13 (×5): 10 mg via ORAL
  Filled 2018-09-08 (×5): qty 1

## 2018-09-08 MED ORDER — LORATADINE 10 MG PO TABS
10.0000 mg | ORAL_TABLET | Freq: Every day | ORAL | Status: DC
  Administered 2018-09-09 – 2018-09-13 (×5): 10 mg via ORAL
  Filled 2018-09-08 (×5): qty 1

## 2018-09-08 MED ORDER — SODIUM CHLORIDE 0.9 % IV SOLN: 250.0000 mL | INTRAVENOUS | Status: DC | PRN

## 2018-09-08 MED ORDER — PANTOPRAZOLE SODIUM 40 MG PO TBEC
40.0000 mg | DELAYED_RELEASE_TABLET | Freq: Every day | ORAL | Status: DC
  Administered 2018-09-09 – 2018-09-13 (×5): 40 mg via ORAL
  Filled 2018-09-08 (×5): qty 1

## 2018-09-08 MED ORDER — HYDROCHLOROTHIAZIDE 25 MG PO TABS
12.5000 mg | ORAL_TABLET | Freq: Every day | ORAL | Status: DC
  Administered 2018-09-10 – 2018-09-13 (×4): 12.5 mg via ORAL
  Filled 2018-09-08 (×4): qty 1

## 2018-09-08 MED ORDER — SODIUM CHLORIDE 0.9% FLUSH
3.0000 mL | Freq: Two times a day (BID) | INTRAVENOUS | Status: DC
  Administered 2018-09-08: 3 mL via INTRAVENOUS

## 2018-09-08 MED ORDER — SODIUM CHLORIDE 0.9 % WEIGHT BASED INFUSION
3.0000 mL/kg/h | INTRAVENOUS | Status: DC
  Administered 2018-09-09: 3 mL/kg/h via INTRAVENOUS

## 2018-09-08 MED ORDER — FLUTICASONE PROPIONATE 50 MCG/ACT NA SUSP
2.0000 | Freq: Every day | NASAL | Status: DC
  Administered 2018-09-09 – 2018-09-13 (×5): 2 via NASAL
  Filled 2018-09-08: qty 16

## 2018-09-08 MED ORDER — SODIUM CHLORIDE 0.9 % WEIGHT BASED INFUSION
1.0000 mL/kg/h | INTRAVENOUS | Status: DC
  Administered 2018-09-09: 1 mL/kg/h via INTRAVENOUS

## 2018-09-08 MED ORDER — HYDRALAZINE HCL 20 MG/ML IJ SOLN: 5.0000 mg | INTRAMUSCULAR | Status: DC | PRN

## 2018-09-08 MED ORDER — ASPIRIN 81 MG PO CHEW
81.0000 mg | CHEWABLE_TABLET | ORAL | Status: AC
  Administered 2018-09-09: 81 mg via ORAL
  Filled 2018-09-08: qty 1

## 2018-09-08 MED ORDER — LEVOTHYROXINE SODIUM 25 MCG PO TABS
25.0000 ug | ORAL_TABLET | Freq: Every day | ORAL | Status: DC
  Administered 2018-09-09 – 2018-09-13 (×5): 25 ug via ORAL
  Filled 2018-09-08 (×5): qty 1

## 2018-09-08 MED ORDER — DOCUSATE SODIUM 100 MG PO CAPS
100.0000 mg | ORAL_CAPSULE | Freq: Every day | ORAL | Status: DC
  Administered 2018-09-09 – 2018-09-13 (×4): 100 mg via ORAL
  Filled 2018-09-08 (×5): qty 1

## 2018-09-08 MED ORDER — METOPROLOL TARTRATE 25 MG PO TABS
25.0000 mg | ORAL_TABLET | Freq: Two times a day (BID) | ORAL | Status: DC
  Administered 2018-09-08 – 2018-09-13 (×10): 25 mg via ORAL
  Filled 2018-09-08 (×10): qty 1

## 2018-09-08 NOTE — Plan of Care (Signed)
  Problem: Education: Goal: Knowledge of General Education information will improve Description Including pain rating scale, medication(s)/side effects and non-pharmacologic comfort measures Outcome: Progressing   Problem: Health Behavior/Discharge Planning: Goal: Ability to manage health-related needs will improve Outcome: Progressing   

## 2018-09-08 NOTE — H&P (Addendum)
Cardiology Admission History and Physical:   Patient ID: Ronald Bird MRN: [id number]; DOB: Mar 22, 1936   Admission date: 09/08/2018  Primary Care Provider: Street, Sharon Mt, MD Primary Cardiologist: Jenne Campus, MD  Primary Electrophysiologist:  None   Chief Complaint: Exertional chest pain and shortness of breath, positive stress test  Patient Profile:   Ronald Bird is a 83 y.o. male with history of AAA, A. fib on Eliquis, hypertension, hypothyroidism, GERD who presented to [facility] with complaints of chest pain and dyspnea on exertion.  History of Present Illness:   Ronald Bird is followed by Dr. Agustin Cree for atrial fibrillation.  He has no history of CAD or prior MI.  He presented to [facility] with complaints of chest pain and dyspnea on exertion.  Troponins were negative.  EKG nonischemic.  Chest x-ray was negative.  Stress echocardiogram done today with chest pain limiting his participation and 1.5 mm ST depressions in the inferior and antero-lateral and lateral leads.  The echocardiogram showed regional wall motion abnormalities in the mid to distal septum and in the inferior wall which is suggestive of multivessel disease.  The patient was given nitroglycerin during the test with resolution of his symptoms and improvement in EKG findings. The patient was transferred to [facility] for further evaluation with cardiac catheterization.  Upon my examination the patient tells me that he exercises on a treadmill for 30 minutes 3 times a week-3 miles and does toning muscle exercises for an hour 3 times a week at [employer].  He had been tolerating exercise well however he was becoming increasingly fatigued earlier.  On Sunday evening while on the treadmill after about 10 minutes he developed chest tightness and shortness of breath.  He stopped and the symptoms resolved after about 30 minutes.  He has not gone back to the gym since.  On Tuesday he was  sweeping his garage and again felt chest tightness and shortness of breath that resolved with rest.  He has a family history of CAD with bypass surgery in both parents and says he has been expecting to have a blockage at some point so he came in to have his symptoms evaluated.  He denies any lightheadedness, orthopnea, PND, palpitations, edema.  The patient has never smoked and does not drink alcohol.  He has tried statins in the past, at least 2 different ones, but had significant muscle aches.   Patient has a history of normal stress echocardiogram in 2019.   Past Medical History:  Diagnosis Date  . BPH (benign prostatic hyperplasia)   . DJD (degenerative joint disease)   . GERD (gastroesophageal reflux disease)   . History of colonic polyps   . Hypercholesteremia   . Hypertension     Past Surgical History:  Procedure Laterality Date  . CATARACT EXTRACTION, BILATERAL  2012   In [facility]  . MOHS SURGERY  2012   Skin Cancer removed from nose by Moh's in [place]  . SHOULDER SURGERY       Medications Prior to Admission: Prior to Admission medications   Medication Sig Start Date End Date Taking? Authorizing Provider  apixaban (ELIQUIS) 5 MG TABS tablet Take 1 tablet (5 mg total) by mouth 2 (two) times daily. 03/24/18   Park Liter, MD  budesonide (RHINOCORT ALLERGY) 32 MCG/ACT nasal spray Place 2 sprays into both nostrils daily.    [provider]  calcium carbonate (TUMS - DOSED IN MG ELEMENTAL CALCIUM) 500 MG chewable tablet Chew  1 tablet by mouth as needed for indigestion or heartburn.    [provider]  cetirizine (ZYRTEC) 10 MG chewable tablet Chew 10 mg by mouth daily.    [provider]  Cinnamon 500 MG TABS Take 2 tablets by mouth daily.      [provider]  Cyanocobalamin (B-12) 2500 MCG SUBL Place 1 tablet under the tongue daily.    [provider]  docusate sodium (COLACE) 100 MG capsule Take 100 mg by mouth  daily.    [provider]  ezetimibe (ZETIA) 10 MG tablet Take 1 tablet (10 mg total) by mouth daily. 01/28/18   Park Liter, MD  fish oil-omega-3 fatty acids 1000 MG capsule Take 2 g by mouth 3 (three) times daily.      [provider]  fluconazole (DIFLUCAN) 200 MG tablet Take 200 mg by mouth once a week. On Wednesday at lunch as needed.    [provider]  glucosamine-chondroitin 500-400 MG tablet Take 1 tablet by mouth daily.      [provider]  hydrochlorothiazide (HYDRODIURIL) 12.5 MG tablet Take 12.5 mg by mouth daily.    [provider]  Lactobacillus (PROBIOTIC ACIDOPHILUS PO) Take 1 tablet by mouth daily.    [provider]  levothyroxine (SYNTHROID, LEVOTHROID) 25 MCG tablet Take 25 mcg by mouth daily before breakfast.    [provider]  lisinopril (PRINIVIL,ZESTRIL) 40 MG tablet Take 1 tablet (40 mg total) by mouth daily. 01/28/18   Park Liter, MD  Lycopene 10 MG CAPS Take 1 capsule by mouth daily.      [provider]  Methylcellulose, Laxative, (CITRUCEL PO) Take by mouth.    [provider]  metoprolol tartrate (LOPRESSOR) 25 MG tablet Take 1 tablet (25 mg total) by mouth 2 (two) times daily. 10/08/17 01/06/18  Revankar, Reita Cliche, MD  metoprolol tartrate (LOPRESSOR) 25 MG tablet Take 25 mg by mouth 2 (two) times daily.    [provider]  Multiple Vitamin (MULTIVITAMIN) capsule Take 1 capsule by mouth daily.      [provider]  NON FORMULARY Muscle Cramp Pain Reliever    [provider]  nystatin cream (MYCOSTATIN) Apply 1 application topically 2 (two) times daily.    [provider]  pantoprazole (PROTONIX) 40 MG tablet Take 1 tablet (40 mg total) by mouth daily. 30 mins before a meal  07/01/11   Noralee Space, MD  Saw Palmetto, Serenoa repens, 1000 MG CAPS Take 2 capsules by mouth 2 (two) times daily.      [provider]  triamcinolone  cream (KENALOG) 0.1 % Apply 1 application topically 2 (two) times daily.    [provider]  Vitamin D, Ergocalciferol, (DRISDOL) 50000 units CAPS capsule Take 50,000 Units by mouth every 7 (seven) days.    [provider]     Allergies:   No Known Allergies  Social History:   Social History   Socioeconomic History  . Marital status: Married    Spouse name: [name]  . Number of children: 2  . Years of education: Not on file  . Highest education level: Not on file  Occupational History  . Not on file  Social Needs  . Financial resource strain: Not on file  . Food insecurity:    Worry: Not on file    Inability: Not on file  . Transportation needs:    Medical: Not on file    Non-medical: Not on  file  Tobacco Use  . Smoking status: Never Smoker  . Smokeless tobacco: Never Used  Substance and Sexual Activity  . Alcohol use: No  . Drug use: No  . Sexual activity: Not on file  Lifestyle  . Physical activity:    Days per week: Not on file    Minutes per session: Not on file  . Stress: Not on file  Relationships  . Social connections:    Talks on phone: Not on file    Gets together: Not on file    Attends religious service: Not on file    Active member of club or organization: Not on file    Attends meetings of clubs or organizations: Not on file    Relationship status: Not on file  . Intimate partner violence:    Fear of current or ex partner: Not on file    Emotionally abused: Not on file    Physically abused: Not on file    Forced sexual activity: Not on file  Other Topics Concern  . Not on file  Social History Narrative  . Not on file    Family History:   The patient's family history includes Atrial fibrillation in his brother; CAD in his sister; Heart disease in his father and mother; Hypertension in his father and mother; Stroke in his father and mother.    ROS:  Please see the history of present illness.  All other ROS reviewed and negative.       Physical Exam/Data:   Vitals:   09/08/18 1522  BP: (!) 179/89  Pulse: 66  Resp: 20  Temp: (!) 97.1 F (36.2 C)  TempSrc: Oral  SpO2: 100%  Weight: 85.9 kg  Height: 6\' 2"  (1.88 m)   No intake or output data in the 24 hours ending 09/08/18 1715 Last 3 Weights 09/08/2018 03/17/2018 09/30/2017  Weight (lbs) 189 lb 6.4 oz 196 lb 3.2 oz 193 lb  Weight (kg) 85.911 kg 88.996 kg 87.544 kg     Body mass index is 24.32 kg/m.  General:  Well nourished, well developed, in no acute distress HEENT: normal Lymph: no adenopathy Neck: no JVD Endocrine:  No thryomegaly Vascular: No carotid bruits; FA pulses 2+ bilaterally  Cardiac:  normal S1, S2; RRR; no murmur  Lungs:  clear to auscultation bilaterally, no wheezing, rhonchi or rales  Abd: soft, nontender, no hepatomegaly  Ext: no edema Musculoskeletal:  No deformities, BUE and BLE strength normal and equal Skin: warm and dry  Neuro:  CNs 2-12 intact, no focal abnormalities noted Psych:  Normal affect    EKG:  The ECG that was done was personally reviewed and demonstrates normal sinus rhythm without ischemic changes  Relevant CV Studies:  See above description of stress echocardiogram in HPI  Laboratory Data:  ChemistryNo results for input(s): NA, K, CL, CO2, GLUCOSE, BUN, CREATININE, CALCIUM, GFRNONAA, GFRAA, ANIONGAP in the last 168 hours.  No results for input(s): PROT, ALBUMIN, AST, ALT, ALKPHOS, BILITOT in the last 168 hours. HematologyNo results for input(s): WBC, RBC, HGB, HCT, MCV, MCH, MCHC, RDW, PLT in the last 168 hours. Cardiac EnzymesNo results for input(s): TROPONINI in the last 168 hours. No results for input(s): TROPIPOC in the last 168 hours.  BNPNo results for input(s): BNP, PROBNP in the last 168 hours.  DDimer No results for input(s): DDIMER in the last 168 hours.  Radiology/Studies:  No results found.  Assessment and Plan:   1. Unstable angina -Presented to [facility] with exertional  chest pain and  shortness of breath. -Ruled out for MI with negative troponins and nonischemic EKG. -Stress echocardiogram done today with chest pain limiting his participation and 1.5 mm ST depressions in the inferior and antero-lateral and lateral leads.  The echocardiogram showed regional wall motion abnormalities in the mid to distal septum and in the inferior wall which is suggestive of multivessel disease.  The patient was given nitroglycerin during the test with resolution of his symptoms and improvement in EKG findings. -Plan for cardiac catheterization tomorrow to further evaluate coronary anatomy. -Patient continued on home metoprolol.  Aspirin and statin have been added  2.  Paroxysmal atrial fibrillation -Followed by Dr. Agustin Cree. -Maintaining sinus rhythm, on metoprolol which is currently being held for bradycardia. -CHA2DS2/VAS Stroke Risk Points score is at least 3(, HTN, Age (2)).  He is anticoagulated with Eliquis for stroke risk reduction, which is currently on hold pending cardiac cath.   3.  Hypertension -Home management includes hydrochlorothiazide, lisinopril and metoprolol.   Amlodipine added for improved blood pressure control. -will continue all meds except hold HCTZ prior to cath tomorrow.  4.  Hyperlipidemia -He is continued on home Zetia.  Patient states prior statin intolerance due to myalgias.  5.  Hypothyroidism -Continue on home Synthroid  6.  GERD -Continue PPI.   Severity of Illness: The appropriate patient status for this patient is INPATIENT. Inpatient status is judged to be reasonable and necessary in order to provide the required intensity of service to ensure the patient's safety. The patient's presenting symptoms, physical exam findings, and initial radiographic and laboratory data in the context of their chronic comorbidities is felt to place them at high risk for further clinical deterioration. Furthermore, it is not anticipated that the patient will be medically  stable for discharge from the hospital within 2 midnights of admission. The following factors support the patient status of inpatient.   " The patient's presenting symptoms include personal chest pain and shortness of breath. " The worrisome physical exam findings include normal physical exam. " The initial radiographic and laboratory data are worrisome because of abnormal stress echocardiogram. " The chronic co-morbidities include hypertension and hyperlipidemia.   * I certify that at the point of admission it is my clinical judgment that the patient will require inpatient hospital care spanning beyond 2 midnights from the point of admission due to high intensity of service, high risk for further deterioration and high frequency of surveillance required.*    For questions or updates, please contact [place] Please consult [email or web address] for contact info under        Signed, Daune Perch, NP  09/08/2018 5:15 PM  As above, patient seen and examined.  Briefly he is an 83 year old male with past medical history of paroxysmal atrial fibrillation, hypertension, hyperlipidemia, hypothyroidism, thoracic aortic aneurysm transferred from [facility] with unstable angina for cardiac catheterization.  Patient has had recent onset of exertional chest pain relieved with rest.  He describes it as a tightness without radiation.  The pain is not pleuritic.  He was admitted to [facility] and a stress echocardiogram by report showed distal septal and inferior ischemia.  He was therefore transferred for catheterization.  Presently pain-free.  1 unstable angina-patient symptoms are concerning and stress echocardiogram reportedly showed ischemia in the distal septum and inferior wall.  Plan cardiac catheterization tomorrow.  The risks and benefits including myocardial infarction, CVA and death discussed and he agrees to proceed.  Continue aspirin, beta-blocker.  Patient apparently intolerant to  statins.  2 paroxysmal atrial fibrillation-CHADSvasc 3 (4 if CAD demonstrated at time of cath); apixaban on hold for cardiac catheterization.  Will resume after procedure.  We will continue metoprolol.  3 hypertension-patient's blood pressure is mildly elevated.  We are holding hydrochlorothiazide prior to catheterization.  We will follow blood pressure while here and adjust regimen as needed.  4 hyperlipidemia-continue Zetia.  May need to consider Repatha as an outpatient if he does not tolerate statins.  5 history of thoracic aortic aneurysm-mildly dilated on prior CT.  4.1 cm on most recent study.  Follow-up as an outpatient.  Kirk Ruths, MD

## 2018-09-08 NOTE — Progress Notes (Signed)
Patient arrived to unit, no complaints. Grandson at bedside. Will notify MD.

## 2018-09-09 ENCOUNTER — Encounter (HOSPITAL_COMMUNITY): Payer: Self-pay | Admitting: Cardiovascular Disease

## 2018-09-09 ENCOUNTER — Encounter (HOSPITAL_COMMUNITY)
Admission: AD | Disposition: A | Discharge status: Home / Self Care | Payer: Self-pay | Source: Other Acute Inpatient Hospital | Attending: Cardiology

## 2018-09-09 DIAGNOSIS — I480 Paroxysmal atrial fibrillation: Secondary | ICD-10-CM

## 2018-09-09 DIAGNOSIS — I25110 Atherosclerotic heart disease of native coronary artery with unstable angina pectoris: Principal | ICD-10-CM

## 2018-09-09 DIAGNOSIS — E785 Hyperlipidemia, unspecified: Secondary | ICD-10-CM

## 2018-09-09 HISTORY — PX: LEFT HEART CATH AND CORONARY ANGIOGRAPHY: CATH118249

## 2018-09-09 LAB — CBC
HCT: 40.3 % (ref 39.0–52.0)
Hemoglobin: 13.5 g/dL (ref 13.0–17.0)
MCH: 31.3 pg (ref 26.0–34.0)
MCHC: 33.5 g/dL (ref 30.0–36.0)
MCV: 93.3 fL (ref 80.0–100.0)
Platelets: 183 10*3/uL (ref 150–400)
RBC: 4.32 MIL/uL (ref 4.22–5.81)
RDW: 12.9 % (ref 11.5–15.5)
WBC: 7.9 10*3/uL (ref 4.0–10.5)
nRBC: 0 % (ref 0.0–0.2)

## 2018-09-09 LAB — BASIC METABOLIC PANEL
Anion gap: 8 (ref 5–15)
BUN: 18 mg/dL (ref 8–23)
CO2: 26 mmol/L (ref 22–32)
Calcium: 9 mg/dL (ref 8.9–10.3)
Chloride: 101 mmol/L (ref 98–111)
Creatinine, Ser: 1.04 mg/dL (ref 0.61–1.24)
GFR calc Af Amer: >60 mL/min (ref >60)
GFR calc non Af Amer: >60 mL/min (ref >60)
Glucose, Bld: 106 mg/dL — ABNORMAL HIGH (ref 70–99)
Potassium: 4.4 mmol/L (ref 3.5–5.1)
Sodium: 135 mmol/L (ref 135–145)

## 2018-09-09 LAB — HEPARIN LEVEL (UNFRACTIONATED): Heparin Unfractionated: 0.29 IU/mL — ABNORMAL LOW (ref 0.30–0.70)

## 2018-09-09 LAB — APTT: aPTT: 36 seconds (ref 24–36)

## 2018-09-09 SURGERY — LEFT HEART CATH AND CORONARY ANGIOGRAPHY
Anesthesia: LOCAL

## 2018-09-09 MED ORDER — HEPARIN (PORCINE) IN NACL 1000-0.9 UT/500ML-% IV SOLN
INTRAVENOUS | Status: DC | PRN
  Administered 2018-09-09: 500 mL

## 2018-09-09 MED ORDER — HEPARIN (PORCINE) IN NACL 1000-0.9 UT/500ML-% IV SOLN
INTRAVENOUS | Status: AC
  Filled 2018-09-09: qty 1000

## 2018-09-09 MED ORDER — VERAPAMIL HCL 2.5 MG/ML IV SOLN
INTRAVENOUS | Status: AC
  Filled 2018-09-09: qty 2

## 2018-09-09 MED ORDER — SODIUM CHLORIDE 0.9 % IV SOLN
INTRAVENOUS | Status: AC
  Administered 2018-09-09: 13:00:00 via INTRAVENOUS

## 2018-09-09 MED ORDER — HEPARIN SODIUM (PORCINE) 1000 UNIT/ML IJ SOLN
INTRAMUSCULAR | Status: AC
  Filled 2018-09-09: qty 1

## 2018-09-09 MED ORDER — MIDAZOLAM HCL 2 MG/2ML IJ SOLN
INTRAMUSCULAR | Status: AC
  Filled 2018-09-09: qty 2

## 2018-09-09 MED ORDER — FENTANYL CITRATE (PF) 100 MCG/2ML IJ SOLN
INTRAMUSCULAR | Status: AC
  Filled 2018-09-09: qty 2

## 2018-09-09 MED ORDER — LABETALOL HCL 5 MG/ML IV SOLN: 10.0000 mg | INTRAVENOUS | Status: AC | PRN

## 2018-09-09 MED ORDER — IOHEXOL 350 MG/ML SOLN
INTRAVENOUS | Status: DC | PRN
  Administered 2018-09-09: 65 mL via INTRA_ARTERIAL

## 2018-09-09 MED ORDER — HEPARIN SODIUM (PORCINE) 1000 UNIT/ML IJ SOLN
INTRAMUSCULAR | Status: DC | PRN
  Administered 2018-09-09: 6000 [IU] via INTRAVENOUS
  Administered 2018-09-09: 4000 [IU] via INTRAVENOUS

## 2018-09-09 MED ORDER — SODIUM CHLORIDE 0.9 % IV SOLN: 250.0000 mL | INTRAVENOUS | Status: DC | PRN

## 2018-09-09 MED ORDER — MAGNESIUM OXIDE 400 (241.3 MG) MG PO TABS
400.0000 mg | ORAL_TABLET | Freq: Every day | ORAL | Status: DC
  Administered 2018-09-09 – 2018-09-13 (×5): 400 mg via ORAL
  Filled 2018-09-09 (×5): qty 1

## 2018-09-09 MED ORDER — FENTANYL CITRATE (PF) 100 MCG/2ML IJ SOLN
INTRAMUSCULAR | Status: DC | PRN
  Administered 2018-09-09: 25 ug via INTRAVENOUS

## 2018-09-09 MED ORDER — MIDAZOLAM HCL 2 MG/2ML IJ SOLN
INTRAMUSCULAR | Status: DC | PRN
  Administered 2018-09-09: 1 mg via INTRAVENOUS

## 2018-09-09 MED ORDER — ASPIRIN 81 MG PO CHEW
81.0000 mg | CHEWABLE_TABLET | Freq: Every day | ORAL | Status: DC
  Administered 2018-09-10 – 2018-09-11 (×2): 81 mg via ORAL
  Filled 2018-09-09 (×2): qty 1

## 2018-09-09 MED ORDER — VERAPAMIL HCL 2.5 MG/ML IV SOLN
INTRAVENOUS | Status: DC | PRN
  Administered 2018-09-09: 10 mL via INTRA_ARTERIAL

## 2018-09-09 MED ORDER — CLOPIDOGREL BISULFATE 75 MG PO TABS
75.0000 mg | ORAL_TABLET | Freq: Every day | ORAL | Status: DC
  Administered 2018-09-10 – 2018-09-12 (×3): 75 mg via ORAL
  Filled 2018-09-09 (×3): qty 1

## 2018-09-09 MED ORDER — HYDRALAZINE HCL 20 MG/ML IJ SOLN: 5.0000 mg | INTRAMUSCULAR | Status: AC | PRN

## 2018-09-09 MED ORDER — LIDOCAINE HCL (PF) 1 % IJ SOLN
INTRAMUSCULAR | Status: AC
  Filled 2018-09-09: qty 30

## 2018-09-09 MED ORDER — SODIUM CHLORIDE 0.9% FLUSH: 3.0000 mL | INTRAVENOUS | Status: DC | PRN

## 2018-09-09 MED ORDER — CLOPIDOGREL BISULFATE 75 MG PO TABS
600.0000 mg | ORAL_TABLET | Freq: Once | ORAL | Status: AC
  Administered 2018-09-09: 600 mg via ORAL
  Filled 2018-09-09: qty 8

## 2018-09-09 MED ORDER — LIDOCAINE HCL (PF) 1 % IJ SOLN
INTRAMUSCULAR | Status: DC | PRN
  Administered 2018-09-09: 2 mL

## 2018-09-09 MED ORDER — SODIUM CHLORIDE 0.9% FLUSH
3.0000 mL | Freq: Two times a day (BID) | INTRAVENOUS | Status: DC
  Administered 2018-09-10 – 2018-09-12 (×3): 3 mL via INTRAVENOUS

## 2018-09-09 MED ORDER — HEPARIN (PORCINE) 25000 UT/250ML-% IV SOLN
1100.0000 [IU]/h | INTRAVENOUS | Status: DC
  Administered 2018-09-09 – 2018-09-11 (×2): 1100 [IU]/h via INTRAVENOUS
  Filled 2018-09-09 (×2): qty 250

## 2018-09-09 MED ORDER — SODIUM CHLORIDE 0.9 % IV SOLN
INTRAVENOUS | Status: AC | PRN
  Administered 2018-09-09: 85.9 mL/h via INTRAVENOUS

## 2018-09-09 SURGICAL SUPPLY — 16 items
BALLN SAPPHIRE 2.0X20 (BALLOONS)
BALLOON SAPPHIRE 2.0X20 (BALLOONS) IMPLANT
CATH 5FR JL3.5 JR4 ANG PIG MP (CATHETERS) ×1 IMPLANT
CATH VISTA GUIDE 6FR XBLAD3.5 (CATHETERS) IMPLANT
DEVICE RAD COMP TR BAND LRG (VASCULAR PRODUCTS) ×1 IMPLANT
ELECT DEFIB PAD ADLT CADENCE (PAD) ×1 IMPLANT
GLIDESHEATH SLEND SS 6F .021 (SHEATH) ×1 IMPLANT
GUIDEWIRE INQWIRE 1.5J.035X260 (WIRE) IMPLANT
INQWIRE 1.5J .035X260CM (WIRE) ×2
KIT ENCORE 26 ADVANTAGE (KITS) IMPLANT
KIT HEART LEFT (KITS) ×2 IMPLANT
PACK CARDIAC CATHETERIZATION (CUSTOM PROCEDURE TRAY) ×2 IMPLANT
SYR MEDRAD MARK 7 150ML (SYRINGE) ×1 IMPLANT
TRANSDUCER W/STOPCOCK (MISCELLANEOUS) ×2 IMPLANT
TUBING CIL FLEX 10 FLL-RA (TUBING) ×2 IMPLANT
WIRE COUGAR XT STRL 190CM (WIRE) IMPLANT

## 2018-09-09 NOTE — Progress Notes (Signed)
Progress Note  Patient Name: Ronald Bird Date of Encounter: 09/09/2018  Primary Cardiologist: Jenne Campus, MD   Subjective   No CP or dyspnea  Inpatient Medications    Scheduled Meds: . amLODipine  10 mg Oral Daily  . docusate sodium  100 mg Oral Daily  . ezetimibe  10 mg Oral Daily  . fluticasone  2 spray Each Nare Daily  . [START ON 09/10/2018] hydrochlorothiazide  12.5 mg Oral Daily  . levothyroxine  25 mcg Oral Q0600  . lisinopril  40 mg Oral Daily  . loratadine  10 mg Oral Daily  . metoprolol tartrate  25 mg Oral BID  . pantoprazole  40 mg Oral Daily  . sodium chloride flush  3 mL Intravenous Q12H   Continuous Infusions: . sodium chloride    . sodium chloride 1 mL/kg/hr (09/09/18 0514)   PRN Meds: sodium chloride, hydrALAZINE, sodium chloride flush   Vital Signs    Vitals:   09/08/18 1951 09/09/18 0121 09/09/18 0416 09/09/18 0418  BP: 131/84 116/72 132/73   Pulse: 71 (!) 59 (!) 56   Resp: 18 18 18    Temp: 97.8 F (36.6 C) 98.2 F (36.8 C) 98 F (36.7 C)   TempSrc: Oral Oral Oral   SpO2: 100% 98% 98%   Weight:    84.5 kg  Height:        Intake/Output Summary (Last 24 hours) at 09/09/2018 0817 Last data filed at 09/09/2018 0506 Gross per 24 hour  Intake 497.7 ml  Output 1300 ml  Net -802.3 ml   Last 3 Weights 09/09/2018 09/08/2018 03/17/2018  Weight (lbs) 186 lb 4.8 oz 189 lb 6.4 oz 196 lb 3.2 oz  Weight (kg) 84.505 kg 85.911 kg 88.996 kg      Telemetry    Sinus- Personally Reviewed   Physical Exam   GEN: No acute distress.   Neck: No JVD Cardiac: RRR, no murmurs, rubs, or gallops.  Respiratory: Clear to auscultation bilaterally. GI: Soft, nontender, non-distended  MS: No edema Neuro:  Nonfocal  Psych: Normal affect   Labs    Chemistry Recent Labs  Lab 09/09/18 0544  NA 135  K 4.4  CL 101  CO2 26  GLUCOSE 106*  BUN 18  CREATININE 1.04  CALCIUM 9.0  GFRNONAA >60  GFRAA >60  ANIONGAP 8     Hematology Recent Labs    Lab 09/09/18 0544  WBC 7.9  RBC 4.32  HGB 13.5  HCT 40.3  MCV 93.3  MCH 31.3  MCHC 33.5  RDW 12.9  PLT 183    Patient Profile     83 year old male with past medical history of paroxysmal atrial fibrillation, hypertension, hyperlipidemia, hypothyroidism, thoracic aortic aneurysm transferred from [facility] with unstable angina for cardiac catheterization.  Patient has had recent onset of exertional chest pain relieved with rest.  He describes it as a tightness without radiation.  The pain is not pleuritic.  He was admitted to [facility] and a stress echocardiogram by report showed distal septal and inferior ischemia.  He was therefore transferred for catheterization.  Presently pain-free.  Assessment & Plan    1 unstable angina-patient remains pain-free this morning.  Plan to continue aspirin and beta-blocker.  He is intolerant to statins.  Stress echocardiogram at [facility] reportedly demonstrated ischemia in the distal septum and inferior wall.  For cardiac catheterization today as outlined previously.    2 paroxysmal atrial fibrillation-patient remains in sinus rhythm.  Apixaban on hold for  cardiac catheterization.  Resume following procedure.  Continue metoprolol.    3 hypertension-patient's blood pressure is mildly elevated.  HCTZ on hold for catheterization.  Resume after procedure.  Follow blood pressure and adjust regimen as needed.  4 hyperlipidemia-continue Zetia.  May need to consider Repatha as an outpatient if he does not tolerate statins.  5 history of thoracic aortic aneurysm-mildly dilated on prior CT.  4.1 cm on most recent study.  Follow-up as an outpatient.  For questions or updates, please contact [place] Please consult [email or web address] for contact info under        Signed, Kirk Ruths, MD  09/09/2018, 8:17 AM

## 2018-09-09 NOTE — Progress Notes (Signed)
ANTICOAGULATION CONSULT NOTE - Initial Consult  Pharmacy Consult for heparin Indication: atrial fibrillation  No Known Allergies  Patient Measurements: Height: 6\' 2"  (188 cm) Weight: 186 lb 4.8 oz (84.5 kg) IBW/kg (Calculated) : 82.2 Heparin Dosing Weight: 85.9 kg  Vital Signs: Temp: 97.2 F (36.2 C) (01/31 1311) Temp Source: Oral (01/31 0834) BP: 136/82 (01/31 1426) Pulse Rate: 55 (01/31 1426)  Labs: Recent Labs    09/09/18 0544  HGB 13.5  HCT 40.3  PLT 183  CREATININE 1.04    Estimated Creatinine Clearance: 65.9 mL/min (by C-G formula based on SCr of 1.04 mg/dL).   Medical History: Past Medical History:  Diagnosis Date  . BPH (benign prostatic hyperplasia)   . DJD (degenerative joint disease)   . GERD (gastroesophageal reflux disease)   . History of colonic polyps   . Hypercholesteremia   . Hypertension     Medications:  Scheduled:  . amLODipine  10 mg Oral Daily  . [START ON 09/10/2018] aspirin  81 mg Oral Daily  . clopidogrel  600 mg Oral Once  . [START ON 09/10/2018] clopidogrel  75 mg Oral Daily  . docusate sodium  100 mg Oral Daily  . ezetimibe  10 mg Oral Daily  . fluticasone  2 spray Each Nare Daily  . [START ON 09/10/2018] hydrochlorothiazide  12.5 mg Oral Daily  . levothyroxine  25 mcg Oral Q0600  . lisinopril  40 mg Oral Daily  . loratadine  10 mg Oral Daily  . metoprolol tartrate  25 mg Oral BID  . pantoprazole  40 mg Oral Daily  . sodium chloride flush  3 mL Intravenous Q12H    Assessment: 83 yo M on apixaban for atrial fibrillation prior to admission. Last dose taken 1/28 AM and had been on hold pending cath for unstable angina. LHC completed 1/31 without intervention. Plan for PCI of the LAD on Monday. Pharmacy has been consulted for heparin (to start 8 hours post sheath removal) while apixaban is on hold until revascularization is completed.   Sheath removed 1/31 @ 1246, CBC wnl   Goal of Therapy:  Heparin level 0.3-0.7 units/ml aPTT  66-102 seconds Monitor platelets by anticoagulation protocol: Yes   Plan:   Check baseline aPTT and heparin level since taking apixaban PTA No heparin bolus Start heparin infusion at 1,100 units/hr at 2100 Check aPTT and heparin level 8 hours after starting heparin and daily while on heparin Continue to monitor H&H and platelets  Vertis Kelch, PharmD PGY1 Pharmacy Resident Phone [phone] 09/09/2018       2:31 PM

## 2018-09-09 NOTE — H&P (View-Only) (Signed)
Progress Note  Patient Name: Ronald Bird Date of Encounter: 09/09/2018  Primary Cardiologist: Jenne Campus, MD   Subjective   No CP or dyspnea  Inpatient Medications    Scheduled Meds: . amLODipine  10 mg Oral Daily  . docusate sodium  100 mg Oral Daily  . ezetimibe  10 mg Oral Daily  . fluticasone  2 spray Each Nare Daily  . [START ON 09/10/2018] hydrochlorothiazide  12.5 mg Oral Daily  . levothyroxine  25 mcg Oral Q0600  . lisinopril  40 mg Oral Daily  . loratadine  10 mg Oral Daily  . metoprolol tartrate  25 mg Oral BID  . pantoprazole  40 mg Oral Daily  . sodium chloride flush  3 mL Intravenous Q12H   Continuous Infusions: . sodium chloride    . sodium chloride 1 mL/kg/hr (09/09/18 0514)   PRN Meds: sodium chloride, hydrALAZINE, sodium chloride flush   Vital Signs    Vitals:   09/08/18 1951 09/09/18 0121 09/09/18 0416 09/09/18 0418  BP: 131/84 116/72 132/73   Pulse: 71 (!) 59 (!) 56   Resp: 18 18 18    Temp: 97.8 F (36.6 C) 98.2 F (36.8 C) 98 F (36.7 C)   TempSrc: Oral Oral Oral   SpO2: 100% 98% 98%   Weight:    84.5 kg  Height:        Intake/Output Summary (Last 24 hours) at 09/09/2018 0817 Last data filed at 09/09/2018 0506 Gross per 24 hour  Intake 497.7 ml  Output 1300 ml  Net -802.3 ml   Last 3 Weights 09/09/2018 09/08/2018 03/17/2018  Weight (lbs) 186 lb 4.8 oz 189 lb 6.4 oz 196 lb 3.2 oz  Weight (kg) 84.505 kg 85.911 kg 88.996 kg      Telemetry    Sinus- Personally Reviewed   Physical Exam   GEN: No acute distress.   Neck: No JVD Cardiac: RRR, no murmurs, rubs, or gallops.  Respiratory: Clear to auscultation bilaterally. GI: Soft, nontender, non-distended  MS: No edema Neuro:  Nonfocal  Psych: Normal affect   Labs    Chemistry Recent Labs  Lab 09/09/18 0544  NA 135  K 4.4  CL 101  CO2 26  GLUCOSE 106*  BUN 18  CREATININE 1.04  CALCIUM 9.0  GFRNONAA >60  GFRAA >60  ANIONGAP 8     Hematology Recent Labs    Lab 09/09/18 0544  WBC 7.9  RBC 4.32  HGB 13.5  HCT 40.3  MCV 93.3  MCH 31.3  MCHC 33.5  RDW 12.9  PLT 183    Patient Profile     83 year old male with past medical history of paroxysmal atrial fibrillation, hypertension, hyperlipidemia, hypothyroidism, thoracic aortic aneurysm transferred from [facility] with unstable angina for cardiac catheterization.  Patient has had recent onset of exertional chest pain relieved with rest.  He describes it as a tightness without radiation.  The pain is not pleuritic.  He was admitted to [facility] and a stress echocardiogram by report showed distal septal and inferior ischemia.  He was therefore transferred for catheterization.  Presently pain-free.  Assessment & Plan    1 unstable angina-patient remains pain-free this morning.  Plan to continue aspirin and beta-blocker.  He is intolerant to statins.  Stress echocardiogram at [facility] reportedly demonstrated ischemia in the distal septum and inferior wall.  For cardiac catheterization today as outlined previously.    2 paroxysmal atrial fibrillation-patient remains in sinus rhythm.  Apixaban on hold for  cardiac catheterization.  Resume following procedure.  Continue metoprolol.    3 hypertension-patient's blood pressure is mildly elevated.  HCTZ on hold for catheterization.  Resume after procedure.  Follow blood pressure and adjust regimen as needed.  4 hyperlipidemia-continue Zetia.  May need to consider Repatha as an outpatient if he does not tolerate statins.  5 history of thoracic aortic aneurysm-mildly dilated on prior CT.  4.1 cm on most recent study.  Follow-up as an outpatient.  For questions or updates, please contact [place] Please consult [email or web address] for contact info under        Signed, Kirk Ruths, MD  09/09/2018, 8:17 AM

## 2018-09-09 NOTE — Interval H&P Note (Signed)
History and Physical Interval Note:  09/09/2018 11:52 AM  Ronald Bird  has presented today for surgery, with the diagnosis of unstable angina, abnormal stress test.  The various methods of treatment have been discussed with the patient and family. After consideration of risks, benefits and other options for treatment, the patient has consented to  Procedure(s): LEFT HEART CATH AND CORONARY ANGIOGRAPHY (N/A) as a surgical intervention .  The patient's history has been reviewed, patient examined, no change in status, stable for surgery.  I have reviewed the patient's chart and labs.  Questions were answered to the patient's satisfaction.    Cath Lab Visit (complete for each Cath Lab visit)  Clinical Evaluation Leading to the Procedure:   ACS: No.  Non-ACS:    Anginal Classification: CCS III  Anti-ischemic medical therapy: Minimal Therapy (1 class of medications)  Non-Invasive Test Results: Intermediate risk stress test  Prior CABG: No previous CABG        Lauree Chandler

## 2018-09-10 ENCOUNTER — Inpatient Hospital Stay (HOSPITAL_COMMUNITY): Payer: Medicare Other

## 2018-09-10 DIAGNOSIS — I10 Essential (primary) hypertension: Secondary | ICD-10-CM

## 2018-09-10 DIAGNOSIS — I361 Nonrheumatic tricuspid (valve) insufficiency: Secondary | ICD-10-CM

## 2018-09-10 DIAGNOSIS — E7800 Pure hypercholesterolemia, unspecified: Secondary | ICD-10-CM

## 2018-09-10 LAB — BASIC METABOLIC PANEL
Anion gap: 9 (ref 5–15)
BUN: 14 mg/dL (ref 8–23)
CALCIUM: 9.1 mg/dL (ref 8.9–10.3)
CO2: 24 mmol/L (ref 22–32)
Chloride: 102 mmol/L (ref 98–111)
Creatinine, Ser: 1.06 mg/dL (ref 0.61–1.24)
GFR calc Af Amer: >60 mL/min (ref >60)
GFR calc non Af Amer: >60 mL/min (ref >60)
Glucose, Bld: 111 mg/dL — ABNORMAL HIGH (ref 70–99)
Potassium: 4.7 mmol/L (ref 3.5–5.1)
SODIUM: 135 mmol/L (ref 135–145)

## 2018-09-10 LAB — CBC
HCT: 40 % (ref 39.0–52.0)
Hemoglobin: 13.9 g/dL (ref 13.0–17.0)
MCH: 32.4 pg (ref 26.0–34.0)
MCHC: 34.8 g/dL (ref 30.0–36.0)
MCV: 93.2 fL (ref 80.0–100.0)
Platelets: 182 10*3/uL (ref 150–400)
RBC: 4.29 MIL/uL (ref 4.22–5.81)
RDW: 13 % (ref 11.5–15.5)
WBC: 8.4 10*3/uL (ref 4.0–10.5)
nRBC: 0 % (ref 0.0–0.2)

## 2018-09-10 LAB — HEPARIN LEVEL (UNFRACTIONATED)
HEPARIN UNFRACTIONATED: 0.59 [IU]/mL (ref 0.30–0.70)
Heparin Unfractionated: 0.6 IU/mL (ref 0.30–0.70)

## 2018-09-10 LAB — ECHOCARDIOGRAM COMPLETE
Height: 74 in
Weight: 2984 oz

## 2018-09-10 LAB — APTT: APTT: 84 s — AB (ref 24–36)

## 2018-09-10 NOTE — Progress Notes (Signed)
ANTICOAGULATION CONSULT NOTE   Pharmacy Consult for Heparin Indication: atrial fibrillation, CP/ACS  No Known Allergies  Patient Measurements: Height: 6\' 2"  (188 cm) Weight: 186 lb 8 oz (84.6 kg) IBW/kg (Calculated) : 82.2 Heparin Dosing Weight: 85.9 kg  Vital Signs: Temp: 98.3 F (36.8 C) (02/01 0531) Temp Source: Oral (02/01 0531) BP: 116/86 (02/01 0531) Pulse Rate: 59 (02/01 0531)  Labs: Recent Labs    09/09/18 0544 09/09/18 1841 09/10/18 0610  HGB 13.5  --  13.9  HCT 40.3  --  40.0  PLT 183  --  182  APTT  --  36 84*  HEPARINUNFRC  --  0.29* 0.59  CREATININE 1.04  --   --     Estimated Creatinine Clearance: 65.9 mL/min (by C-G formula based on SCr of 1.04 mg/dL).   Medical History: Past Medical History:  Diagnosis Date  . BPH (benign prostatic hyperplasia)   . DJD (degenerative joint disease)   . GERD (gastroesophageal reflux disease)   . History of colonic polyps   . Hypercholesteremia   . Hypertension     Medications:  Scheduled:  . amLODipine  10 mg Oral Daily  . aspirin  81 mg Oral Daily  . clopidogrel  75 mg Oral Daily  . docusate sodium  100 mg Oral Daily  . ezetimibe  10 mg Oral Daily  . fluticasone  2 spray Each Nare Daily  . hydrochlorothiazide  12.5 mg Oral Daily  . levothyroxine  25 mcg Oral Q0600  . lisinopril  40 mg Oral Daily  . loratadine  10 mg Oral Daily  . magnesium oxide  400 mg Oral Daily  . metoprolol tartrate  25 mg Oral BID  . pantoprazole  40 mg Oral Daily  . sodium chloride flush  3 mL Intravenous Q12H    Assessment: 83 yo M on apixaban for atrial fibrillation prior to admission. Last dose taken 1/28 AM and had been on hold pending cath for unstable angina. LHC completed 1/31 without intervention. Plan for PCI of the LAD on Monday. Pharmacy has been consulted for heparin (to start 8 hours post sheath removal) while apixaban is on hold until revascularization is completed.   Sheath removed 1/31 @ 1246, CBC wnl  2/1 AM  update: heparin level and aPTT are correlating, will start using heparin level only to dose, heparin level is therapeutic    Goal of Therapy:  Heparin level 0.3-0.7 units/mL Monitor platelets by anticoagulation protocol: Yes   Plan:   Cont heparin at 1100 units/hr Confirmatory heparin level at [place], PharmD, [place] Pharmacist Phone: [phone]

## 2018-09-10 NOTE — Progress Notes (Signed)
Progress Note  Patient Name: Ronald Bird Date of Encounter: 09/10/2018  Primary Cardiologist: Jenne Campus, MD   Subjective   Feeling well.  No chest pain or pressure.   Inpatient Medications    Scheduled Meds: . amLODipine  10 mg Oral Daily  . aspirin  81 mg Oral Daily  . clopidogrel  75 mg Oral Daily  . docusate sodium  100 mg Oral Daily  . ezetimibe  10 mg Oral Daily  . fluticasone  2 spray Each Nare Daily  . hydrochlorothiazide  12.5 mg Oral Daily  . levothyroxine  25 mcg Oral Q0600  . lisinopril  40 mg Oral Daily  . loratadine  10 mg Oral Daily  . magnesium oxide  400 mg Oral Daily  . metoprolol tartrate  25 mg Oral BID  . pantoprazole  40 mg Oral Daily  . sodium chloride flush  3 mL Intravenous Q12H   Continuous Infusions: . sodium chloride    . heparin 1,100 Units/hr (09/09/18 2146)   PRN Meds: sodium chloride, sodium chloride flush   Vital Signs    Vitals:   09/09/18 2030 09/10/18 0124 09/10/18 0531 09/10/18 0848  BP: 117/82 131/81 116/86 (!) 141/84  Pulse: 70 (!) 57 (!) 59 60  Resp:  18 18   Temp:  98.7 F (37.1 C) 98.3 F (36.8 C)   TempSrc:  Oral Oral   SpO2:  98% 100%   Weight:   84.6 kg   Height:        Intake/Output Summary (Last 24 hours) at 09/10/2018 1215 Last data filed at 09/10/2018 0818 Gross per 24 hour  Intake 934.03 ml  Output 1125 ml  Net -190.97 ml   Last 3 Weights 09/10/2018 09/09/2018 09/08/2018  Weight (lbs) 186 lb 8 oz 186 lb 4.8 oz 189 lb 6.4 oz  Weight (kg) 84.596 kg 84.505 kg 85.911 kg      Telemetry    Sinus rhythm, sinus bradycardia.  Rate 50-80s.  - Personally Reviewed  ECG    Sinus bradycardia.  Rate 55 bpm.  First degree AV block.  - Personally Reviewed  Physical Exam   VS:  BP (!) 141/84 (BP Location: Left Arm)   Pulse 60   Temp 98.3 F (36.8 C) (Oral)   Resp 18   Ht 6\' 2"  (1.88 m)   Wt 84.6 kg   SpO2 100%   BMI 23.95 kg/m  , BMI Body mass index is 23.95 kg/m. GENERAL:  Well appearing HEENT:  Pupils equal round and reactive, fundi not visualized, oral mucosa unremarkable NECK:  No jugular venous distention, waveform within normal limits, carotid upstroke brisk and symmetric, no bruits LUNGS:  Clear to auscultation bilaterally HEART:  RRR.  PMI not displaced or sustained,S1 and S2 within normal limits, no S3, no S4, no clicks, no rubs, no murmurs ABD:  Flat, positive bowel sounds normal in frequency in pitch, no bruits, no rebound, no guarding, no midline pulsatile mass, no hepatomegaly, no splenomegaly EXT:  2 plus pulses throughout, no edema, no cyanosis no clubbing SKIN:  No rashes no nodules NEURO:  Cranial nerves II through XII grossly intact, motor grossly intact throughout PSYCH:  Cognitively intact, oriented to person place and time   Labs    Chemistry Recent Labs  Lab 09/09/18 0544 09/10/18 0610  NA 135 135  K 4.4 4.7  CL 101 102  CO2 26 24  GLUCOSE 106* 111*  BUN 18 14  CREATININE 1.04 1.06  CALCIUM 9.0  9.1  GFRNONAA >60 >60  GFRAA >60 >60  ANIONGAP 8 9     Hematology Recent Labs  Lab 09/09/18 0544 09/10/18 0610  WBC 7.9 8.4  RBC 4.32 4.29  HGB 13.5 13.9  HCT 40.3 40.0  MCV 93.3 93.2  MCH 31.3 32.4  MCHC 33.5 34.8  RDW 12.9 13.0  PLT 183 182    Cardiac EnzymesNo results for input(s): TROPONINI in the last 168 hours. No results for input(s): TROPIPOC in the last 168 hours.   BNPNo results for input(s): BNP, PROBNP in the last 168 hours.   DDimer No results for input(s): DDIMER in the last 168 hours.   Radiology    No results found.  Cardiac Studies   LHC 09/09/18:  1st Diag lesion is 90% stenosed.  Prox LAD lesion is 60% stenosed.  Prox LAD to Mid LAD lesion is 95% stenosed.  Ost 1st Diag lesion is 50% stenosed.  Mid LM to Dist LM lesion is 40% stenosed.  Mid RCA lesion is 40% stenosed.  RPDA lesion is 90% stenosed.  Prox RCA to Mid RCA lesion is 30% stenosed.  Prox Cx to Mid Cx lesion is 20% stenosed.   1. Mild to  moderate eccentric stenosis in distal left main at the site of what appears to be a healed plaque.  2. The LAD courses to the apex. The mid LAD has a long, calcified stenosis involving a large diagonal branch. The Diagonal branch has moderate ostial stenosis. The mid segment of the diagonal branch has a severe stenosis beyond a bifurcation in the vessel.  3. Mild non-obstructive disease in the Circumflex 4. Mild nonobstructive disease in the RCA. The PDA becomes small in caliber. There is a severe distal PDA stenosis. This vessel is too small for PCI.   Patient Profile     83 y.o. male with paroxysmal atrial fibrillation, hypertension, HL, hypothyroidism, and thoracic aortic aneurysm here with unstable angina and positive stress echo.  Assessment & Plan    # Unstable angina: Mr. Flaum underwent cardiac catheterization 1/31 and was found to have severe disease in the LAD distribution as well as in the [place].  He will require orbital atherectomy.  This is scheduled for Monday a.m.  Currently asymptomatic.  Echocardiogram is pending.  Continue aspirin and clopidogrel.   Continue Zetia.  ?statin  # Paroxysmal atrial fibrillation:  Maintianing sinus rhythm.  Home Eliquis is on hold and he is on heparin. Continue metoprolol.  # Hypertension:  BP controlled on amlodipine, lisinopril and metoprolol.  # Hyperlipidemia:  Continue Zetia.  Would start PCSK9 inhibitor as an outpatient.  Unclear why he isn't on a statin.  # Thoracic aortic aneurysm: 4.1cm.  Outpatient follow up.   For questions or updates, please contact [place] Please consult [email or web address] for contact info under        Signed, Skeet Latch, MD  09/10/2018, 12:15 PM

## 2018-09-10 NOTE — Progress Notes (Signed)
  Echocardiogram 2D Echocardiogram has been performed.  Jennette Dubin 09/10/2018, 3:03 PM

## 2018-09-10 NOTE — Progress Notes (Signed)
ANTICOAGULATION CONSULT NOTE   Pharmacy Consult for Heparin Indication: atrial fibrillation, CP/ACS  No Known Allergies  Patient Measurements: Height: 6\' 2"  (188 cm) Weight: 186 lb 8 oz (84.6 kg) IBW/kg (Calculated) : 82.2 Heparin Dosing Weight: 85.9 kg  Vital Signs: Temp: 98.3 F (36.8 C) (02/01 0531) Temp Source: Oral (02/01 0531) BP: 141/84 (02/01 0848) Pulse Rate: 60 (02/01 0848)  Labs: Recent Labs    09/09/18 0544 09/09/18 1841 09/10/18 0610 09/10/18 1146  HGB 13.5  --  13.9  --   HCT 40.3  --  40.0  --   PLT 183  --  182  --   APTT  --  36 84*  --   HEPARINUNFRC  --  0.29* 0.59 0.60  CREATININE 1.04  --  1.06  --     Estimated Creatinine Clearance: 64.6 mL/min (by C-G formula based on SCr of 1.06 mg/dL).   Medical History: Past Medical History:  Diagnosis Date  . BPH (benign prostatic hyperplasia)   . DJD (degenerative joint disease)   . GERD (gastroesophageal reflux disease)   . History of colonic polyps   . Hypercholesteremia   . Hypertension     Medications:  Scheduled:  . amLODipine  10 mg Oral Daily  . aspirin  81 mg Oral Daily  . clopidogrel  75 mg Oral Daily  . docusate sodium  100 mg Oral Daily  . ezetimibe  10 mg Oral Daily  . fluticasone  2 spray Each Nare Daily  . hydrochlorothiazide  12.5 mg Oral Daily  . levothyroxine  25 mcg Oral Q0600  . lisinopril  40 mg Oral Daily  . loratadine  10 mg Oral Daily  . magnesium oxide  400 mg Oral Daily  . metoprolol tartrate  25 mg Oral BID  . pantoprazole  40 mg Oral Daily  . sodium chloride flush  3 mL Intravenous Q12H    Assessment: 83 yo M on apixaban for atrial fibrillation prior to admission. Last dose taken 1/28 AM and had been on hold pending cath for unstable angina. LHC completed 1/31 without intervention. Plan for PCI of the LAD on Monday. Pharmacy has been consulted for heparin (to start 8 hours post sheath removal) while apixaban is on hold until revascularization is completed.    Confirmatory heparin level remains therapeutic at 0.60. Hgb/Hct and pltc remain stable. No signs or symptoms of bleeding reported per RN.    Goal of Therapy:  Heparin level 0.3-0.7 units/mL Monitor platelets by anticoagulation protocol: Yes   Plan:   Continue heparin at 1100 units/hr Daily heparin level and CBC Monitor for s/sx of bleeding  Thank you for allowing pharmacy to be a part of this patient's care.  Leron Croak, PharmD PGY1 Pharmacy Resident Phone: [phone]  Please check AMION for all [place] phone numbers

## 2018-09-11 LAB — CBC
HCT: 39.5 % (ref 39.0–52.0)
Hemoglobin: 13.2 g/dL (ref 13.0–17.0)
MCH: 31.2 pg (ref 26.0–34.0)
MCHC: 33.4 g/dL (ref 30.0–36.0)
MCV: 93.4 fL (ref 80.0–100.0)
Platelets: 160 10*3/uL (ref 150–400)
RBC: 4.23 MIL/uL (ref 4.22–5.81)
RDW: 13 % (ref 11.5–15.5)
WBC: 6.7 10*3/uL (ref 4.0–10.5)
nRBC: 0 % (ref 0.0–0.2)

## 2018-09-11 LAB — BASIC METABOLIC PANEL
Anion gap: 7 (ref 5–15)
BUN: 15 mg/dL (ref 8–23)
CALCIUM: 8.9 mg/dL (ref 8.9–10.3)
CO2: 24 mmol/L (ref 22–32)
Chloride: 102 mmol/L (ref 98–111)
Creatinine, Ser: 1.11 mg/dL (ref 0.61–1.24)
GFR calc Af Amer: >60 mL/min (ref >60)
GFR calc non Af Amer: >60 mL/min (ref >60)
Glucose, Bld: 111 mg/dL — ABNORMAL HIGH (ref 70–99)
Potassium: 4.2 mmol/L (ref 3.5–5.1)
Sodium: 133 mmol/L — ABNORMAL LOW (ref 135–145)

## 2018-09-11 LAB — HEPARIN LEVEL (UNFRACTIONATED): Heparin Unfractionated: 0.43 IU/mL (ref 0.30–0.70)

## 2018-09-11 MED ORDER — ROSUVASTATIN CALCIUM 5 MG PO TABS
10.0000 mg | ORAL_TABLET | Freq: Every day | ORAL | Status: DC
  Administered 2018-09-11 – 2018-09-12 (×2): 10 mg via ORAL
  Filled 2018-09-11 (×2): qty 2

## 2018-09-11 NOTE — Progress Notes (Signed)
ANTICOAGULATION CONSULT NOTE   Pharmacy Consult for Heparin Indication: atrial fibrillation, CP/ACS  No Known Allergies  Patient Measurements: Height: 6\' 2"  (188 cm) Weight: 187 lb 3.2 oz (84.9 kg) IBW/kg (Calculated) : 82.2 Heparin Dosing Weight: 85.9 kg  Vital Signs: Temp: 97.8 F (36.6 C) (02/02 0612) Temp Source: Oral (02/02 0612) BP: 120/76 (02/02 0612) Pulse Rate: 56 (02/02 0612)  Labs: Recent Labs    09/09/18 0544  09/09/18 1841 09/10/18 0610 09/10/18 1146 09/11/18 0546  HGB 13.5  --   --  13.9  --  13.2  HCT 40.3  --   --  40.0  --  39.5  PLT 183  --   --  182  --  160  APTT  --   --  36 84*  --   --   HEPARINUNFRC  --    < > 0.29* 0.59 0.60 0.43  CREATININE 1.04  --   --  1.06  --  1.11   < > = values in this interval not displayed.    Estimated Creatinine Clearance: 61.7 mL/min (by C-G formula based on SCr of 1.11 mg/dL).   Medical History: Past Medical History:  Diagnosis Date  . BPH (benign prostatic hyperplasia)   . DJD (degenerative joint disease)   . GERD (gastroesophageal reflux disease)   . History of colonic polyps   . Hypercholesteremia   . Hypertension     Medications:  Scheduled:  . amLODipine  10 mg Oral Daily  . aspirin  81 mg Oral Daily  . clopidogrel  75 mg Oral Daily  . docusate sodium  100 mg Oral Daily  . ezetimibe  10 mg Oral Daily  . fluticasone  2 spray Each Nare Daily  . hydrochlorothiazide  12.5 mg Oral Daily  . levothyroxine  25 mcg Oral Q0600  . lisinopril  40 mg Oral Daily  . loratadine  10 mg Oral Daily  . magnesium oxide  400 mg Oral Daily  . metoprolol tartrate  25 mg Oral BID  . pantoprazole  40 mg Oral Daily  . sodium chloride flush  3 mL Intravenous Q12H    Assessment: 83 yo M on apixaban for atrial fibrillation prior to admission. Last dose taken 1/28 AM and had been on hold pending cath for unstable angina. LHC completed 1/31 without intervention. Plan for PCI of the LAD on Monday. Pharmacy has been  consulted for heparin (to start 8 hours post sheath removal) while apixaban is on hold until revascularization is completed.   Heparin level remains therapeutic at 0.43 this AM. CBC remains stable, and no s/sx of bleeding or issues with infusion noted.    Goal of Therapy:  Heparin level 0.3-0.7 units/mL Monitor platelets by anticoagulation protocol: Yes   Plan:   Continue heparin at 1100 units/hr Daily heparin level and CBC Monitor for s/sx of bleeding  Thank you for allowing pharmacy to be a part of this patient's care.  Leron Croak, PharmD PGY1 Pharmacy Resident Phone: [phone]  Please check AMION for all [place] phone numbers

## 2018-09-11 NOTE — Progress Notes (Signed)
Progress Note  Patient Name: Ronald Bird Date of Encounter: 09/11/2018  Primary Cardiologist: Ronald Campus, MD   Subjective   Feeling well.  No chest pain or pressure.    Inpatient Medications    Scheduled Meds: . amLODipine  10 mg Oral Daily  . aspirin  81 mg Oral Daily  . clopidogrel  75 mg Oral Daily  . docusate sodium  100 mg Oral Daily  . ezetimibe  10 mg Oral Daily  . fluticasone  2 spray Each Nare Daily  . hydrochlorothiazide  12.5 mg Oral Daily  . levothyroxine  25 mcg Oral Q0600  . lisinopril  40 mg Oral Daily  . loratadine  10 mg Oral Daily  . magnesium oxide  400 mg Oral Daily  . metoprolol tartrate  25 mg Oral BID  . pantoprazole  40 mg Oral Daily  . sodium chloride flush  3 mL Intravenous Q12H   Continuous Infusions: . sodium chloride    . heparin 1,100 Units/hr (09/09/18 2146)   PRN Meds: sodium chloride, sodium chloride flush   Vital Signs    Vitals:   09/10/18 1712 09/10/18 1932 09/11/18 0612 09/11/18 1102  BP: 119/76 123/75 120/76 119/71  Pulse: 62 62 (!) 56 63  Resp: 16 18    Temp: (!) 97.5 F (36.4 C)  97.8 F (36.6 C)   TempSrc: Oral  Oral   SpO2: 99% 98% 98%   Weight:   84.9 kg   Height:        Intake/Output Summary (Last 24 hours) at 09/11/2018 1151 Last data filed at 09/11/2018 0958 Gross per 24 hour  Intake 1480.5 ml  Output 1575 ml  Net -94.5 ml   Last 3 Weights 09/11/2018 09/10/2018 09/09/2018  Weight (lbs) 187 lb 3.2 oz 186 lb 8 oz 186 lb 4.8 oz  Weight (kg) 84.913 kg 84.596 kg 84.505 kg      Telemetry    Sinus rhythm, sinus bradycardia.  Rate 50-70s.  - Personally Reviewed  ECG    Sinus bradycardia.  Rate 55 bpm.  First degree AV block.  - Personally Reviewed  Physical Exam   VS:  BP 119/71   Pulse 63   Temp 97.8 F (36.6 C) (Oral)   Resp 18   Ht 6\' 2"  (1.88 m)   Wt 84.9 kg   SpO2 98%   BMI 24.04 kg/m  , BMI Body mass index is 24.04 kg/m. GENERAL:  Well appearing HEENT: Pupils equal round and reactive,  fundi not visualized, oral mucosa unremarkable NECK:  No jugular venous distention, waveform within normal limits, carotid upstroke brisk and symmetric, no bruits LUNGS:  Clear to auscultation bilaterally HEART:  RRR.  PMI not displaced or sustained,S1 and S2 within normal limits, no S3, no S4, no clicks, no rubs, no murmurs ABD:  Flat, positive bowel sounds normal in frequency in pitch, no bruits, no rebound, no guarding, no midline pulsatile mass, no hepatomegaly, no splenomegaly EXT:  2 plus pulses throughout, no edema, no cyanosis no clubbing SKIN:  No rashes no nodules NEURO:  Cranial nerves II through XII grossly intact, motor grossly intact throughout [facility]:  Cognitively intact, oriented to person place and time   Labs    Chemistry Recent Labs  Lab 09/09/18 0544 09/10/18 0610 09/11/18 0546  NA 135 135 133*  K 4.4 4.7 4.2  CL 101 102 102  CO2 26 24 24   GLUCOSE 106* 111* 111*  BUN 18 14 15   CREATININE 1.04 1.06  1.11  CALCIUM 9.0 9.1 8.9  GFRNONAA >60 >60 >60  GFRAA >60 >60 >60  ANIONGAP 8 9 7      Hematology Recent Labs  Lab 09/09/18 0544 09/10/18 0610 09/11/18 0546  WBC 7.9 8.4 6.7  RBC 4.32 4.29 4.23  HGB 13.5 13.9 13.2  HCT 40.3 40.0 39.5  MCV 93.3 93.2 93.4  MCH 31.3 32.4 31.2  MCHC 33.5 34.8 33.4  RDW 12.9 13.0 13.0  PLT 183 182 160    Cardiac EnzymesNo results for input(s): TROPONINI in the last 168 hours. No results for input(s): TROPIPOC in the last 168 hours.   BNPNo results for input(s): BNP, PROBNP in the last 168 hours.   DDimer No results for input(s): DDIMER in the last 168 hours.   Radiology    No results found.  Cardiac Studies   LHC 09/09/18:  1st Diag lesion is 90% stenosed.  Prox LAD lesion is 60% stenosed.  Prox LAD to Mid LAD lesion is 95% stenosed.  Ost 1st Diag lesion is 50% stenosed.  Mid LM to Dist LM lesion is 40% stenosed.  Mid RCA lesion is 40% stenosed.  RPDA lesion is 90% stenosed.  Prox RCA to Mid RCA  lesion is 30% stenosed.  Prox Cx to Mid Cx lesion is 20% stenosed.   1. Mild to moderate eccentric stenosis in distal left main at the site of what appears to be a healed plaque.  2. The LAD courses to the apex. The mid LAD has a long, calcified stenosis involving a large diagonal branch. The Diagonal branch has moderate ostial stenosis. The mid segment of the diagonal branch has a severe stenosis beyond a bifurcation in the vessel.  3. Mild non-obstructive disease in the Circumflex 4. Mild nonobstructive disease in the RCA. The PDA becomes small in caliber. There is a severe distal PDA stenosis. This vessel is too small for PCI.   Echo 09/10/18: IMPRESSIONS    1. The left ventricle has normal systolic function of 51-88%. The cavity size is mildly increased. There is no left ventricular wall thickness. Echo evidence of impaired relaxation diastolic filling patterns. Normal left ventricular filling pressures.  2. Normal left atrial size.  3. Normal right atrial size.  4. The mitral valve normal in structure. There is mild mitral annular calcification present. Regurgitation is not visualized by color flow Doppler.  5. Normal tricuspid valve.  6. Tricuspid regurgitation is mild.  7. The aortic valve tricuspid. There is mild thickening and mild calcification of the aortic valve. Aortic valve regurgitation is mild by color flow Doppler.  8. No atrial level shunt detected by color flow Doppler.   Patient Profile     83 y.o. male with paroxysmal atrial fibrillation, hypertension, HL, hypothyroidism, and thoracic aortic aneurysm here with unstable angina and positive stress echo.  Assessment & Plan    # Unstable angina: # Hyperlipidemia: [name] underwent cardiac catheterization 1/31 and was found to have severe disease in the LAD distribution as well as in the [place].  He will require orbital atherectomy.  This is scheduled for Monday a.m.  He remains asymptomatic.  Echocardiogram showed  normal systolic function.  Continue aspirin and clopidogrel.   He was intolerant of atorvastatin and has only been on Zetia.  He is willing to try low dose rosuvastatin.  # Paroxysmal atrial fibrillation:  Maintianing sinus rhythm.  Home Eliquis is on hold and he is on heparin. Continue metoprolol.  # Hypertension:  BP controlled on amlodipine, lisinopril  and metoprolol.  # Thoracic aortic aneurysm: 4.1cm.  Outpatient follow up.   For questions or updates, please contact [place] Please consult [email or web address] for contact info under        Signed, Skeet Latch, MD  09/11/2018, 11:51 AM

## 2018-09-11 NOTE — Progress Notes (Signed)
Patient resting comfortably during shift report. Denies complaints.  

## 2018-09-11 NOTE — H&P (View-Only) (Signed)
Progress Note  Patient Name: Ronald Bird Date of Encounter: 09/11/2018  Primary Cardiologist: Jenne Campus, MD   Subjective   Feeling well.  No chest pain or pressure.    Inpatient Medications    Scheduled Meds: . amLODipine  10 mg Oral Daily  . aspirin  81 mg Oral Daily  . clopidogrel  75 mg Oral Daily  . docusate sodium  100 mg Oral Daily  . ezetimibe  10 mg Oral Daily  . fluticasone  2 spray Each Nare Daily  . hydrochlorothiazide  12.5 mg Oral Daily  . levothyroxine  25 mcg Oral Q0600  . lisinopril  40 mg Oral Daily  . loratadine  10 mg Oral Daily  . magnesium oxide  400 mg Oral Daily  . metoprolol tartrate  25 mg Oral BID  . pantoprazole  40 mg Oral Daily  . sodium chloride flush  3 mL Intravenous Q12H   Continuous Infusions: . sodium chloride    . heparin 1,100 Units/hr (09/09/18 2146)   PRN Meds: sodium chloride, sodium chloride flush   Vital Signs    Vitals:   09/10/18 1712 09/10/18 1932 09/11/18 0612 09/11/18 1102  BP: 119/76 123/75 120/76 119/71  Pulse: 62 62 (!) 56 63  Resp: 16 18    Temp: (!) 97.5 F (36.4 C)  97.8 F (36.6 C)   TempSrc: Oral  Oral   SpO2: 99% 98% 98%   Weight:   84.9 kg   Height:        Intake/Output Summary (Last 24 hours) at 09/11/2018 1151 Last data filed at 09/11/2018 0958 Gross per 24 hour  Intake 1480.5 ml  Output 1575 ml  Net -94.5 ml   Last 3 Weights 09/11/2018 09/10/2018 09/09/2018  Weight (lbs) 187 lb 3.2 oz 186 lb 8 oz 186 lb 4.8 oz  Weight (kg) 84.913 kg 84.596 kg 84.505 kg      Telemetry    Sinus rhythm, sinus bradycardia.  Rate 50-70s.  - Personally Reviewed  ECG    Sinus bradycardia.  Rate 55 bpm.  First degree AV block.  - Personally Reviewed  Physical Exam   VS:  BP 119/71   Pulse 63   Temp 97.8 F (36.6 C) (Oral)   Resp 18   Ht 6\' 2"  (1.88 m)   Wt 84.9 kg   SpO2 98%   BMI 24.04 kg/m  , BMI Body mass index is 24.04 kg/m. GENERAL:  Well appearing HEENT: Pupils equal round and reactive,  fundi not visualized, oral mucosa unremarkable NECK:  No jugular venous distention, waveform within normal limits, carotid upstroke brisk and symmetric, no bruits LUNGS:  Clear to auscultation bilaterally HEART:  RRR.  PMI not displaced or sustained,S1 and S2 within normal limits, no S3, no S4, no clicks, no rubs, no murmurs ABD:  Flat, positive bowel sounds normal in frequency in pitch, no bruits, no rebound, no guarding, no midline pulsatile mass, no hepatomegaly, no splenomegaly EXT:  2 plus pulses throughout, no edema, no cyanosis no clubbing SKIN:  No rashes no nodules NEURO:  Cranial nerves II through XII grossly intact, motor grossly intact throughout [facility]:  Cognitively intact, oriented to person place and time   Labs    Chemistry Recent Labs  Lab 09/09/18 0544 09/10/18 0610 09/11/18 0546  NA 135 135 133*  K 4.4 4.7 4.2  CL 101 102 102  CO2 26 24 24   GLUCOSE 106* 111* 111*  BUN 18 14 15   CREATININE 1.04 1.06  1.11  CALCIUM 9.0 9.1 8.9  GFRNONAA >60 >60 >60  GFRAA >60 >60 >60  ANIONGAP 8 9 7      Hematology Recent Labs  Lab 09/09/18 0544 09/10/18 0610 09/11/18 0546  WBC 7.9 8.4 6.7  RBC 4.32 4.29 4.23  HGB 13.5 13.9 13.2  HCT 40.3 40.0 39.5  MCV 93.3 93.2 93.4  MCH 31.3 32.4 31.2  MCHC 33.5 34.8 33.4  RDW 12.9 13.0 13.0  PLT 183 182 160    Cardiac EnzymesNo results for input(s): TROPONINI in the last 168 hours. No results for input(s): TROPIPOC in the last 168 hours.   BNPNo results for input(s): BNP, PROBNP in the last 168 hours.   DDimer No results for input(s): DDIMER in the last 168 hours.   Radiology    No results found.  Cardiac Studies   LHC 09/09/18:  1st Diag lesion is 90% stenosed.  Prox LAD lesion is 60% stenosed.  Prox LAD to Mid LAD lesion is 95% stenosed.  Ost 1st Diag lesion is 50% stenosed.  Mid LM to Dist LM lesion is 40% stenosed.  Mid RCA lesion is 40% stenosed.  RPDA lesion is 90% stenosed.  Prox RCA to Mid RCA  lesion is 30% stenosed.  Prox Cx to Mid Cx lesion is 20% stenosed.   1. Mild to moderate eccentric stenosis in distal left main at the site of what appears to be a healed plaque.  2. The LAD courses to the apex. The mid LAD has a long, calcified stenosis involving a large diagonal branch. The Diagonal branch has moderate ostial stenosis. The mid segment of the diagonal branch has a severe stenosis beyond a bifurcation in the vessel.  3. Mild non-obstructive disease in the Circumflex 4. Mild nonobstructive disease in the RCA. The PDA becomes small in caliber. There is a severe distal PDA stenosis. This vessel is too small for PCI.   Echo 09/10/18: IMPRESSIONS    1. The left ventricle has normal systolic function of 32-35%. The cavity size is mildly increased. There is no left ventricular wall thickness. Echo evidence of impaired relaxation diastolic filling patterns. Normal left ventricular filling pressures.  2. Normal left atrial size.  3. Normal right atrial size.  4. The mitral valve normal in structure. There is mild mitral annular calcification present. Regurgitation is not visualized by color flow Doppler.  5. Normal tricuspid valve.  6. Tricuspid regurgitation is mild.  7. The aortic valve tricuspid. There is mild thickening and mild calcification of the aortic valve. Aortic valve regurgitation is mild by color flow Doppler.  8. No atrial level shunt detected by color flow Doppler.   Patient Profile     83 y.o. male with paroxysmal atrial fibrillation, hypertension, HL, hypothyroidism, and thoracic aortic aneurysm here with unstable angina and positive stress echo.  Assessment & Plan    # Unstable angina: # Hyperlipidemia: Ronald Bird underwent cardiac catheterization 1/31 and was found to have severe disease in the LAD distribution as well as in the [place].  He will require orbital atherectomy.  This is scheduled for Monday a.m.  He remains asymptomatic.  Echocardiogram showed  normal systolic function.  Continue aspirin and clopidogrel.   He was intolerant of atorvastatin and has only been on Zetia.  He is willing to try low dose rosuvastatin.  # Paroxysmal atrial fibrillation:  Maintianing sinus rhythm.  Home Eliquis is on hold and he is on heparin. Continue metoprolol.  # Hypertension:  BP controlled on amlodipine, lisinopril  and metoprolol.  # Thoracic aortic aneurysm: 4.1cm.  Outpatient follow up.   For questions or updates, please contact [facility] Please consult [email or web address] for contact info under        Signed, Skeet Latch, MD  09/11/2018, 11:51 AM

## 2018-09-12 ENCOUNTER — Inpatient Hospital Stay (HOSPITAL_COMMUNITY)
Admission: AD | Disposition: A | Discharge status: Home / Self Care | Payer: Self-pay | Source: Other Acute Inpatient Hospital | Attending: Cardiology

## 2018-09-12 ENCOUNTER — Encounter (HOSPITAL_COMMUNITY): Payer: Self-pay | Admitting: Interventional Cardiology

## 2018-09-12 HISTORY — PX: LEFT HEART CATH AND CORONARY ANGIOGRAPHY: CATH118249

## 2018-09-12 HISTORY — PX: CORONARY BALLOON ANGIOPLASTY: CATH118233

## 2018-09-12 HISTORY — PX: CORONARY STENT INTERVENTION: CATH118234

## 2018-09-12 HISTORY — PX: CORONARY ATHERECTOMY: CATH118238

## 2018-09-12 LAB — POCT ACTIVATED CLOTTING TIME
ACTIVATED CLOTTING TIME: 301 s
ACTIVATED CLOTTING TIME: 329 s
Activated Clotting Time: 312 seconds
Activated Clotting Time: 290 seconds

## 2018-09-12 LAB — CBC
HCT: 39.6 % (ref 39.0–52.0)
Hemoglobin: 13.4 g/dL (ref 13.0–17.0)
MCH: 31.3 pg (ref 26.0–34.0)
MCHC: 33.8 g/dL (ref 30.0–36.0)
MCV: 92.5 fL (ref 80.0–100.0)
Platelets: 174 10*3/uL (ref 150–400)
RBC: 4.28 MIL/uL (ref 4.22–5.81)
RDW: 12.8 % (ref 11.5–15.5)
WBC: 7.9 10*3/uL (ref 4.0–10.5)
nRBC: 0 % (ref 0.0–0.2)

## 2018-09-12 LAB — GLUCOSE, CAPILLARY
Glucose-Capillary: 100 mg/dL — ABNORMAL HIGH (ref 70–99)
Glucose-Capillary: 122 mg/dL — ABNORMAL HIGH (ref 70–99)

## 2018-09-12 LAB — HEPARIN LEVEL (UNFRACTIONATED): HEPARIN UNFRACTIONATED: 0.49 [IU]/mL (ref 0.30–0.70)

## 2018-09-12 SURGERY — CORONARY STENT INTERVENTION
Anesthesia: LOCAL

## 2018-09-12 MED ORDER — SODIUM CHLORIDE 0.9% FLUSH: 3.0000 mL | INTRAVENOUS | Status: DC | PRN

## 2018-09-12 MED ORDER — VERAPAMIL HCL 2.5 MG/ML IV SOLN
INTRAVENOUS | Status: DC | PRN
  Administered 2018-09-12: 10 mL via INTRA_ARTERIAL

## 2018-09-12 MED ORDER — ONDANSETRON HCL 4 MG/2ML IJ SOLN
4.0000 mg | Freq: Four times a day (QID) | INTRAMUSCULAR | Status: DC | PRN
  Administered 2018-09-12: 12:00:00 4 mg via INTRAVENOUS
  Filled 2018-09-12: qty 2

## 2018-09-12 MED ORDER — LIDOCAINE HCL (PF) 1 % IJ SOLN
INTRAMUSCULAR | Status: DC | PRN
  Administered 2018-09-12: 2 mL via INTRADERMAL

## 2018-09-12 MED ORDER — HEPARIN SODIUM (PORCINE) 1000 UNIT/ML IJ SOLN
INTRAMUSCULAR | Status: AC
  Filled 2018-09-12: qty 1

## 2018-09-12 MED ORDER — SODIUM CHLORIDE 0.9 % WEIGHT BASED INFUSION
1.0000 mL/kg/h | INTRAVENOUS | Status: DC
  Administered 2018-09-12: 1 mL/kg/h via INTRAVENOUS

## 2018-09-12 MED ORDER — LABETALOL HCL 5 MG/ML IV SOLN: 10.0000 mg | INTRAVENOUS | Status: AC | PRN

## 2018-09-12 MED ORDER — HEPARIN SODIUM (PORCINE) 1000 UNIT/ML IJ SOLN
INTRAMUSCULAR | Status: DC | PRN
  Administered 2018-09-12: 8000 [IU] via INTRAVENOUS
  Administered 2018-09-12: 2000 [IU] via INTRAVENOUS
  Administered 2018-09-12: 3000 [IU] via INTRAVENOUS

## 2018-09-12 MED ORDER — IOHEXOL 350 MG/ML SOLN
INTRAVENOUS | Status: DC | PRN
  Administered 2018-09-12: 130 mL via INTRA_ARTERIAL

## 2018-09-12 MED ORDER — VERAPAMIL HCL 2.5 MG/ML IV SOLN
INTRAVENOUS | Status: AC
  Filled 2018-09-12: qty 2

## 2018-09-12 MED ORDER — CLOPIDOGREL BISULFATE 75 MG PO TABS
75.0000 mg | ORAL_TABLET | Freq: Every day | ORAL | Status: DC
  Administered 2018-09-13: 75 mg via ORAL
  Filled 2018-09-12: qty 1

## 2018-09-12 MED ORDER — HYDRALAZINE HCL 20 MG/ML IJ SOLN: 5.0000 mg | INTRAMUSCULAR | Status: AC | PRN

## 2018-09-12 MED ORDER — HEPARIN (PORCINE) IN NACL 1000-0.9 UT/500ML-% IV SOLN
INTRAVENOUS | Status: AC
  Filled 2018-09-12: qty 500

## 2018-09-12 MED ORDER — SODIUM CHLORIDE 0.9% FLUSH: 3.0000 mL | Freq: Two times a day (BID) | INTRAVENOUS | Status: DC

## 2018-09-12 MED ORDER — SODIUM CHLORIDE 0.9 % IV SOLN
250.0000 mL | INTRAVENOUS | Status: DC | PRN
  Administered 2018-09-12: 250 mL via INTRAVENOUS

## 2018-09-12 MED ORDER — ASPIRIN 81 MG PO CHEW
81.0000 mg | CHEWABLE_TABLET | Freq: Every day | ORAL | Status: DC
  Administered 2018-09-13: 11:00:00 81 mg via ORAL
  Filled 2018-09-12: qty 1

## 2018-09-12 MED ORDER — MIDAZOLAM HCL 2 MG/2ML IJ SOLN
INTRAMUSCULAR | Status: AC
  Filled 2018-09-12: qty 2

## 2018-09-12 MED ORDER — NITROGLYCERIN 1 MG/10 ML FOR IR/CATH LAB
INTRA_ARTERIAL | Status: AC
  Filled 2018-09-12: qty 10

## 2018-09-12 MED ORDER — MIDAZOLAM HCL 2 MG/2ML IJ SOLN
INTRAMUSCULAR | Status: DC | PRN
  Administered 2018-09-12: 2 mg via INTRAVENOUS

## 2018-09-12 MED ORDER — FENTANYL CITRATE (PF) 100 MCG/2ML IJ SOLN
INTRAMUSCULAR | Status: DC | PRN
  Administered 2018-09-12: 12.5 ug via INTRAVENOUS
  Administered 2018-09-12 (×2): 25 ug via INTRAVENOUS
  Administered 2018-09-12: 12.5 ug via INTRAVENOUS
  Administered 2018-09-12: 25 ug via INTRAVENOUS

## 2018-09-12 MED ORDER — SODIUM CHLORIDE 0.9 % IV SOLN: 250.0000 mL | INTRAVENOUS | Status: DC | PRN

## 2018-09-12 MED ORDER — ATROPINE SULFATE 1 MG/10ML IJ SOSY
PREFILLED_SYRINGE | INTRAMUSCULAR | Status: DC | PRN
  Administered 2018-09-12: 0.5 mg via INTRAVENOUS

## 2018-09-12 MED ORDER — VIPERSLIDE LUBRICANT OPTIME
TOPICAL | Status: DC | PRN
  Administered 2018-09-12: 08:00:00 via SURGICAL_CAVITY

## 2018-09-12 MED ORDER — HEPARIN (PORCINE) IN NACL 1000-0.9 UT/500ML-% IV SOLN
INTRAVENOUS | Status: AC
  Filled 2018-09-12: qty 1000

## 2018-09-12 MED ORDER — ANGIOPLASTY BOOK
Freq: Once | Status: AC
  Administered 2018-09-12: 22:00:00
  Filled 2018-09-12: qty 1

## 2018-09-12 MED ORDER — SODIUM CHLORIDE 0.9 % WEIGHT BASED INFUSION
3.0000 mL/kg/h | INTRAVENOUS | Status: DC
  Administered 2018-09-12: 3 mL/kg/h via INTRAVENOUS

## 2018-09-12 MED ORDER — SODIUM CHLORIDE 0.9 % IV SOLN: INTRAVENOUS | Status: AC

## 2018-09-12 MED ORDER — HEPARIN (PORCINE) IN NACL 1000-0.9 UT/500ML-% IV SOLN
INTRAVENOUS | Status: DC | PRN
  Administered 2018-09-12 (×3): 500 mL

## 2018-09-12 MED ORDER — ASPIRIN 81 MG PO CHEW
81.0000 mg | CHEWABLE_TABLET | ORAL | Status: AC
  Administered 2018-09-12: 81 mg via ORAL
  Filled 2018-09-12: qty 1

## 2018-09-12 MED ORDER — LIDOCAINE HCL (PF) 1 % IJ SOLN
INTRAMUSCULAR | Status: AC
  Filled 2018-09-12: qty 30

## 2018-09-12 MED ORDER — FENTANYL CITRATE (PF) 100 MCG/2ML IJ SOLN
INTRAMUSCULAR | Status: AC
  Filled 2018-09-12: qty 2

## 2018-09-12 MED ORDER — DOPAMINE-DEXTROSE 3.2-5 MG/ML-% IV SOLN
INTRAVENOUS | Status: DC | PRN
  Administered 2018-09-12: 10 ug/kg/min via INTRAVENOUS

## 2018-09-12 MED ORDER — ACETAMINOPHEN 325 MG PO TABS
650.0000 mg | ORAL_TABLET | ORAL | Status: DC | PRN
  Administered 2018-09-12 – 2018-09-13 (×2): 650 mg via ORAL
  Filled 2018-09-12 (×2): qty 2

## 2018-09-12 MED ORDER — NITROGLYCERIN 1 MG/10 ML FOR IR/CATH LAB
INTRA_ARTERIAL | Status: DC | PRN
  Administered 2018-09-12: 200 ug via INTRA_ARTERIAL
  Administered 2018-09-12: 200 ug via INTRACORONARY

## 2018-09-12 SURGICAL SUPPLY — 29 items
BALLN SAPPHIRE 2.0X15 (BALLOONS) ×2
BALLN SAPPHIRE 2.5X15 (BALLOONS) ×2
BALLN SAPPHIRE ~~LOC~~ 3.0X12 (BALLOONS) ×1 IMPLANT
BALLOON SAPPHIRE 2.0X15 (BALLOONS) IMPLANT
BALLOON SAPPHIRE 2.5X15 (BALLOONS) IMPLANT
CATH INFINITI JR4 5F (CATHETERS) ×1 IMPLANT
CATH LAUNCHER 6FR EBU3.5 (CATHETERS) ×1 IMPLANT
CATH TELEPORT (CATHETERS) ×1 IMPLANT
CROWN DIAMONDBACK CLASSIC 1.25 (BURR) ×1 IMPLANT
DEVICE RAD COMP TR BAND LRG (VASCULAR PRODUCTS) ×1 IMPLANT
ELECT DEFIB PAD ADLT CADENCE (PAD) ×1 IMPLANT
GLIDESHEATH SLEND SS 6F .021 (SHEATH) ×1 IMPLANT
GUIDEWIRE INQWIRE 1.5J.035X260 (WIRE) IMPLANT
HOVERMATT SINGLE USE (MISCELLANEOUS) ×1 IMPLANT
INQWIRE 1.5J .035X260CM (WIRE) ×2
KIT ENCORE 26 ADVANTAGE (KITS) ×1 IMPLANT
KIT HEART LEFT (KITS) ×2 IMPLANT
KIT HEMO VALVE WATCHDOG (MISCELLANEOUS) ×1 IMPLANT
LUBRICANT VIPERSLIDE CORONARY (MISCELLANEOUS) ×1 IMPLANT
PACK CARDIAC CATHETERIZATION (CUSTOM PROCEDURE TRAY) ×2 IMPLANT
STENT ORSIRO 2.5X13 (Permanent Stent) ×1 IMPLANT
STENT ORSIRO 2.75X35 (Permanent Stent) ×1 IMPLANT
TRANSDUCER W/STOPCOCK (MISCELLANEOUS) ×2 IMPLANT
TUBING CIL FLEX 10 FLL-RA (TUBING) ×2 IMPLANT
WIRE FIGHTER CROSSING 190CM (WIRE) ×1 IMPLANT
WIRE HI TORQ VERSACORE-J 145CM (WIRE) ×1 IMPLANT
WIRE MAILMAN 300CM (WIRE) ×1 IMPLANT
WIRE SION BLUE 180 (WIRE) ×1 IMPLANT
WIRE VIPERWIRE COR FLEX .012 (WIRE) ×1 IMPLANT

## 2018-09-12 NOTE — Progress Notes (Signed)
    Pt had PCI of  Mid LAD and diag toda  is doing great Anticipate DC tomorrow     Mertie Moores, MD  09/12/2018 [address],  [place], [place]  [address] Pager [phone] Phone: [phone]; Fax: [phone]

## 2018-09-12 NOTE — Progress Notes (Signed)
TR BAND REMOVAL  LOCATION:    right radial  DEFLATED PER PROTOCOL:    Yes.    TIME BAND OFF / DRESSING APPLIED:    1345   SITE UPON ARRIVAL:    Level 0  SITE AFTER BAND REMOVAL:    Level 0  CIRCULATION SENSATION AND MOVEMENT:    Within Normal Limits   Yes.    COMMENTS:   Tolerated procedure well 

## 2018-09-12 NOTE — Care Management Important Message (Signed)
Important Message  Patient Details  Name: Ronald Bird MRN: [id number] Date of Birth: 09-23-1935   Medicare Important Message Given:  Yes    Sera Hitsman P Basil Buffin 09/12/2018, 4:12 PM

## 2018-09-12 NOTE — Interval H&P Note (Signed)
Cath Lab Visit (complete for each Cath Lab visit)  Clinical Evaluation Leading to the Procedure:   ACS: Yes.    Non-ACS:    Anginal Classification: CCS IV  Anti-ischemic medical therapy: Minimal Therapy (1 class of medications)  Non-Invasive Test Results: Intermediate-risk stress test findings: cardiac mortality 1-3%/year  Prior CABG: No previous CABG   Unstable angina sx.   History and Physical Interval Note:  09/12/2018 7:41 AM  Scharlene Gloss  has presented today for surgery, with the diagnosis of cad  The various methods of treatment have been discussed with the patient and family. After consideration of risks, benefits and other options for treatment, the patient has consented to  Procedure(s): CORONARY STENT INTERVENTION (N/A) as a surgical intervention .  The patient's history has been reviewed, patient examined, no change in status, stable for surgery.  I have reviewed the patient's chart and labs.  Questions were answered to the patient's satisfaction.     Larae Grooms

## 2018-09-13 LAB — CBC
HCT: 39.7 % (ref 39.0–52.0)
Hemoglobin: 13.6 g/dL (ref 13.0–17.0)
MCH: 31.8 pg (ref 26.0–34.0)
MCHC: 34.3 g/dL (ref 30.0–36.0)
MCV: 92.8 fL (ref 80.0–100.0)
Platelets: 180 10*3/uL (ref 150–400)
RBC: 4.28 MIL/uL (ref 4.22–5.81)
RDW: 13 % (ref 11.5–15.5)
WBC: 8.2 10*3/uL (ref 4.0–10.5)
nRBC: 0 % (ref 0.0–0.2)

## 2018-09-13 LAB — BASIC METABOLIC PANEL
Anion gap: 7 (ref 5–15)
BUN: 17 mg/dL (ref 8–23)
CO2: 25 mmol/L (ref 22–32)
Calcium: 9 mg/dL (ref 8.9–10.3)
Chloride: 101 mmol/L (ref 98–111)
Creatinine, Ser: 1.11 mg/dL (ref 0.61–1.24)
GFR calc Af Amer: >60 mL/min (ref >60)
GFR calc non Af Amer: >60 mL/min (ref >60)
Glucose, Bld: 103 mg/dL — ABNORMAL HIGH (ref 70–99)
Potassium: 4.3 mmol/L (ref 3.5–5.1)
SODIUM: 133 mmol/L — AB (ref 135–145)

## 2018-09-13 MED ORDER — NITROGLYCERIN 0.4 MG SL SUBL: 0.4000 mg | SUBLINGUAL_TABLET | SUBLINGUAL | 12 refills | Status: AC | PRN

## 2018-09-13 MED ORDER — CLOPIDOGREL BISULFATE 75 MG PO TABS: 75.0000 mg | ORAL_TABLET | Freq: Every day | ORAL | 1 refills | Status: DC

## 2018-09-13 MED ORDER — FLUTICASONE PROPIONATE 50 MCG/ACT NA SUSP: 2.0000 | Freq: Every day | NASAL | 0 refills | Status: AC

## 2018-09-13 MED ORDER — ROSUVASTATIN CALCIUM 10 MG PO TABS: 10.0000 mg | ORAL_TABLET | Freq: Every day | ORAL | 3 refills | Status: DC

## 2018-09-13 MED ORDER — LORATADINE 10 MG PO TABS: 10.0000 mg | ORAL_TABLET | Freq: Every day | ORAL | 0 refills | Status: DC

## 2018-09-13 MED ORDER — APIXABAN 5 MG PO TABS
5.0000 mg | ORAL_TABLET | Freq: Two times a day (BID) | ORAL | Status: DC
  Administered 2018-09-13: 5 mg via ORAL
  Filled 2018-09-13: qty 1

## 2018-09-13 MED FILL — Verapamil HCl IV Soln 2.5 MG/ML: INTRAVENOUS | Qty: 2 | Status: AC

## 2018-09-13 NOTE — Discharge Summary (Addendum)
Discharge Summary    Patient ID: Ronald Bird MRN: [id number]; DOB: 20-Apr-1936  Admit date: 09/08/2018 Discharge date: 09/13/2018  Primary Care Provider: Street, Sharon Mt, MD  Primary Cardiologist: Jenne Campus, MD  Primary Electrophysiologist:  None   Discharge Diagnoses    Principal Problem:   Unstable angina [facility]) Active Problems:   HYPERCHOLESTEROLEMIA   Essential hypertension   Paroxysmal atrial fibrillation (HCC)   Allergies No Known Allergies  Diagnostic Studies/Procedures    LEFT HEART CATH AND CORONARY ANGIOGRAPHY 09/12/2018  Conclusion    Dist LM lesion is 40% stenosed.  1st Diag lesion is 90% stenosed.  Balloon angioplasty was performed using a BALLOON SAPPHIRE 2.0X15.  Post intervention, there is a 40% residual stenosis.  Mid LAD-1 lesion is 90% stenosed. Successful atherectomy with CSI device.  A drug-eluting stent was successfully placed using a STENT ORSIRO 2.75X35.  Post intervention, there is a 0% residual stenosis.  Mid LAD-2 lesion is 75% stenosed.  A drug-eluting stent was successfully placed using a STENT ORSIRO 2.5X13. Overlaps the more proximal stent. Overlap inflated to high pressure with stent balloon.  Post intervention, there is a 0% residual stenosis.  LV end diastolic pressure is normal.  There is no aortic valve stenosis.   Attempted to cross with Synergy stent but this did not cross.  Orsiro eventually crossed with [facility] wire.    Antiplatelet and anticoagulation recs given.   OK to stop aspirin sooner if there are bleeding issues.   Diagnostic  Dominance: Right    Intervention    _____________  LEFT HEART CATH AND CORONARY ANGIOGRAPHY 09/09/2018  Conclusion    1st Diag lesion is 90% stenosed.  Prox LAD lesion is 60% stenosed.  Prox LAD to Mid LAD lesion is 95% stenosed.  Ost 1st Diag lesion is 50% stenosed.  Mid LM to Dist LM lesion is 40% stenosed.  Mid RCA lesion is 40% stenosed.  RPDA  lesion is 90% stenosed.  Prox RCA to Mid RCA lesion is 30% stenosed.  Prox Cx to Mid Cx lesion is 20% stenosed.   1. Mild to moderate eccentric stenosis in distal left main at the site of what appears to be a healed plaque.  2. The LAD courses to the apex. The mid LAD has a long, calcified stenosis involving a large diagonal branch. The Diagonal branch has moderate ostial stenosis. The mid segment of the diagonal branch has a severe stenosis beyond a bifurcation in the vessel.  3. Mild non-obstructive disease in the Circumflex 4. Mild non-obstructive disease in the RCA. The PDA becomes small in caliber. There is a severe distal PDA stenosis. This vessel is too small for PCI.   Recommendations: Revascularization of the LAD is indicated. I think this can be approached by a percutaneous approach. The lesion is calcified and the diseased segment involves the ostium of the large diagonal branch. Given the lesion characteristics, I think orbital atherectomy should be performed in the LAD to lessen the chance of plaque shift into the Diagonal branch. Will arrange orbital atherectomy next week. Consider stenting of the mid Diagonal branch prior to LAD stent placement as the LAD stent will cover the ostium of the diagonal branch. I will load him with Plavix today. Echo later today.  ___________________________   Echocardiogram 09/10/2018 IMPRESSIONS   1. The left ventricle has normal systolic function of 39-76%. The cavity size is mildly increased. There is no left ventricular wall thickness. Echo evidence of impaired relaxation diastolic filling patterns. Normal left  ventricular filling pressures.  2. Normal left atrial size.  3. Normal right atrial size.  4. The mitral valve normal in structure. There is mild mitral annular calcification present. Regurgitation is not visualized by color flow Doppler.  5. Normal tricuspid valve.  6. Tricuspid regurgitation is mild.  7. The aortic valve tricuspid.  There is mild thickening and mild calcification of the aortic valve. Aortic valve regurgitation is mild by color flow Doppler.  8. No atrial level shunt detected by color flow Doppler.  FINDINGS  Left Ventricle: No evidence of left ventricular regional wall motion abnormalities. The left ventricle has normal systolic function of 53-61%. The cavity size is mildly increased. There is no left ventricular wall thickness. Echo evidence of impaired  relaxation diastolic filling patterns. Normal left ventricular filling pressures. Right Ventricle: The right ventricle is normal in size. There is normal hypertrophy. There is normal systolic function. Right ventricular systolic pressure is normal with an estimated pressure of 25.8 mmHg. Left Atrium: The left atrium is normal in size. Right Atrium: The right atrial size is normal in size. Interatrial Septum: No atrial level shunt detected by color flow Doppler.  Pericardium: There is no evidence of pericardial effusion. Mitral Valve: The mitral valve normal in structure. There is mild mitral annular calcification present. Regurgitation is not visualized by color flow Doppler. Tricuspid Valve: The tricuspid valve is normal in structure. Tricuspid regurgitation is mild by color flow Doppler. Aortic Valve: The aortic valve tricuspid. There is mild thickening and mild calcification of the aortic valve. Pulmonic Valve: The pulmonic valve is normal. Pulmonic valve regurgitation is not visualized by color flow Doppler. Venous: The inferior vena cava was normal in size with greater than 50% respiratory variablity.   History of Present Illness     Ronald Bird is an 83 year old male with past medical history of paroxysmal atrial fibrillation on a apixaban, hypertension, hyperlipidemia, hypothyroidism, thoracic aortic aneurysm transferred from [facility] with unstable angina for cardiac catheterization.  The patient described a tightness in his chest  without radiation.  The pain was not pleuritic.  He was admitted to [facility] and a stress echocardiogram by report showed distal septal and inferior ischemia.  He was transferred to [facility] for further evaluation with cardiac catheterization.  Hospital Course     Consultants: None  Mr Chura underwent cardiac catheterization on 09/09/2018 and was found to have severe disease in the LAD distribution as well as in the [place].  He was planned for staged intervention with orbital atherectomy.  Echocardiogram showed normal systolic function.  On 09/12/2018 the patient underwent PCI with balloon angioplasty to the first diagonal, atherectomy with CSI device of the mid LAD lesion, DES to the LAD x2.  LV end-diastolic pressure was normal.  Recommendations include resumption of a apixaban and antiplatelet therapy including aspirin 81 mg for 1 month and clopidogrel 75 mg for 6 months.  Mr. Haak was monitored over night without issues. He feels well this morning and right wrist cath site is stable. Labs indicate normal renal function today with serum creatinine 1.11, hemoglobin 13.6.  The patient has a history of intolerance to atorvastatin.  He has been on only Zetia.  The patient agrees to try low-dose of rosuvastatin.  Will need F/U lipid panel, LFTs in 4-6 weeks. He is maintaining sinus rhythm.  His Eliquis will be resumed today.  Blood pressure is controlled on lisinopril, HCTZ and metoprolol. Pt was given amlodipine while HCTZ on hold for cath.  Will discharge on his usual routine and follow-up in the office.  The patient has a stable 4.1 cm thoracic aortic aneurysm, to be followed as an outpatient.  Patient has been seen by Dr. Acie Fredrickson today and deemed ready for discharge home. All follow up appointments have been scheduled. Discharge medications are listed below.  _____________  Discharge Vitals Blood pressure 117/78, pulse 62, temperature 97.6 F (36.4 C), temperature source  Oral, resp. rate 14, height 6\' 2"  (1.88 m), weight 84 kg, SpO2 98 %.  Filed Weights   09/11/18 0612 09/12/18 0458 09/13/18 0442  Weight: 84.9 kg 85 kg 84 kg   Physical Exam  Constitutional: He is oriented to person, place, and time. He appears well-developed and well-nourished. No distress.  HENT:  Head: Normocephalic and atraumatic.  Eyes: Pupils are equal, round, and reactive to light. Conjunctivae are normal.  Neck: Normal range of motion. Neck supple.  Cardiovascular: Normal rate, regular rhythm, normal heart sounds and intact distal pulses. Exam reveals no gallop and no friction rub.  No murmur heard. Pulmonary/Chest: Effort normal and breath sounds normal. No respiratory distress.  Abdominal: Soft. Bowel sounds are normal.  Musculoskeletal: Normal range of motion.        General: No deformity or edema.  Neurological: He is alert and oriented to person, place, and time.  Skin: Skin is warm and dry.  Psychiatric: He has a normal mood and affect. His behavior is normal. Judgment and thought content normal.     Labs & Radiologic Studies    CBC Recent Labs    09/12/18 0514 09/13/18 0531  WBC 7.9 8.2  HGB 13.4 13.6  HCT 39.6 39.7  MCV 92.5 92.8  PLT 174 035   Basic Metabolic Panel Recent Labs    09/11/18 0546 09/13/18 0531  NA 133* 133*  K 4.2 4.3  CL 102 101  CO2 24 25  GLUCOSE 111* 103*  BUN 15 17  CREATININE 1.11 1.11  CALCIUM 8.9 9.0   Liver Function Tests No results for input(s): AST, ALT, ALKPHOS, BILITOT, PROT, ALBUMIN in the last 72 hours. No results for input(s): LIPASE, AMYLASE in the last 72 hours. Cardiac Enzymes No results for input(s): CKTOTAL, CKMB, CKMBINDEX, TROPONINI in the last 72 hours. BNP Invalid input(s): POCBNP D-Dimer No results for input(s): DDIMER in the last 72 hours. Hemoglobin A1C No results for input(s): HGBA1C in the last 72 hours. Fasting Lipid Panel No results for input(s): CHOL, HDL, LDLCALC, TRIG, CHOLHDL, LDLDIRECT in  the last 72 hours. Thyroid Function Tests No results for input(s): TSH, T4TOTAL, T3FREE, THYROIDAB in the last 72 hours.  Invalid input(s): FREET3 _____________  No results found. Disposition   Pt is being discharged home today in good condition.  Follow-up Plans & Appointments    Follow-up Information    Park Liter, MD Follow up.   Specialty:  Cardiology Why:  Cardiology hospital follow-up on 09/23/2018 at 2:20 PM.  Please arrive 10 minutes early for check-in. Contact information: [place] [address] [phone]          Discharge Instructions    Amb Referral to Cardiac Rehabilitation   Complete by:  As directed    Will send Cardiac Rehab Phase 2 referral to [facility]   Diagnosis:  Coronary Stents   Diet - low sodium heart healthy   Complete by:  As directed    Discharge instructions   Complete by:  As directed    PLEASE REMEMBER TO BRING ALL OF  YOUR MEDICATIONS TO EACH OF YOUR FOLLOW-UP OFFICE VISITS.  PLEASE ATTEND ALL SCHEDULED FOLLOW-UP APPOINTMENTS.   Activity: Increase activity slowly as tolerated. You may shower, but no soaking baths (or swimming) for 1 week. No driving for 24 hours. No lifting over 5 lbs for 1 week. No sexual activity for 1 week.   You May Return to Work: in 4 days (if applicable)  Wound Care: You may wash cath site gently with soap and water. Keep cath site clean and dry. If you notice pain, swelling, bleeding or pus at your cath site, please call [phone].   Increase activity slowly   Complete by:  As directed       Discharge Medications     Allergies as of 09/13/2018   No Known Allergies     Medication List    STOP taking these medications   cetirizine 10 MG chewable tablet Commonly known as:  ZYRTEC Replaced by:  loratadine 10 MG tablet   RHINOCORT ALLERGY 32 MCG/ACT nasal spray Generic drug:  budesonide Replaced by:  fluticasone 50 MCG/ACT nasal spray     TAKE these medications   apixaban 5  MG Tabs tablet Commonly known as:  ELIQUIS Take 1 tablet (5 mg total) by mouth 2 (two) times daily.   azelastine 0.1 % nasal spray Commonly known as:  ASTELIN Place 2 sprays into both nostrils as needed for allergies.   B-12 2500 MCG Subl Place 1 tablet under the tongue daily.   calcium carbonate 500 MG chewable tablet Commonly known as:  TUMS - dosed in mg elemental calcium Chew 1 tablet by mouth as needed for indigestion or heartburn.   Cinnamon 500 MG Tabs Take 2 tablets by mouth daily.   CITRUCEL PO Take by mouth.   clopidogrel 75 MG tablet Commonly known as:  PLAVIX Take 1 tablet (75 mg total) by mouth daily with breakfast. Start taking on:  September 14, 2018   docusate sodium 100 MG capsule Commonly known as:  COLACE Take 100 mg by mouth daily.   ezetimibe 10 MG tablet Commonly known as:  ZETIA Take 1 tablet (10 mg total) by mouth daily.   fish oil-omega-3 fatty acids 1000 MG capsule Take 2 g by mouth 3 (three) times daily.   fluticasone 50 MCG/ACT nasal spray Commonly known as:  FLONASE Place 2 sprays into both nostrils daily for 30 days. Start taking on:  September 14, 2018 Replaces:  RHINOCORT ALLERGY 32 MCG/ACT nasal spray   glucosamine-chondroitin 500-400 MG tablet Take 1 tablet by mouth daily.   hydrochlorothiazide 12.5 MG tablet Commonly known as:  HYDRODIURIL Take 12.5 mg by mouth daily.   levothyroxine 25 MCG tablet Commonly known as:  SYNTHROID, LEVOTHROID Take 25 mcg by mouth daily before breakfast.   lisinopril 40 MG tablet Commonly known as:  PRINIVIL,ZESTRIL Take 1 tablet (40 mg total) by mouth daily.   loratadine 10 MG tablet Commonly known as:  CLARITIN Take 1 tablet (10 mg total) by mouth daily for 30 days. Replaces:  cetirizine 10 MG chewable tablet   Lycopene 10 MG Caps Take 1 capsule by mouth daily.   magnesium oxide 400 MG tablet Commonly known as:  MAG-OX Take 400 mg by mouth daily.   metoprolol tartrate 25 MG  tablet Commonly known as:  LOPRESSOR Take 1 tablet (25 mg total) by mouth 2 (two) times daily. What changed:  Another medication with the same name was removed. Continue taking this medication, and follow the directions you see here.  multivitamin capsule Take 1 capsule by mouth daily.   nitroGLYCERIN 0.4 MG SL tablet Commonly known as:  NITROSTAT Place 1 tablet (0.4 mg total) under the tongue every 5 (five) minutes as needed for chest pain.   pantoprazole 40 MG tablet Commonly known as:  PROTONIX Take 1 tablet (40 mg total) by mouth daily. 30 mins before a meal   PROBIOTIC ACIDOPHILUS PO Take 1 tablet by mouth daily.   rosuvastatin 10 MG tablet Commonly known as:  CRESTOR Take 1 tablet (10 mg total) by mouth daily at 6 PM.   Saw Palmetto (Serenoa repens) 1000 MG Caps Take 2 capsules by mouth 2 (two) times daily.   Vitamin D (Ergocalciferol) 1.25 MG (50000 UT) Caps capsule Commonly known as:  DRISDOL Take 50,000 Units by mouth every Tuesday.       Acute coronary syndrome (MI, NSTEMI, STEMI, etc) this admission?: No.    Outstanding Labs/Studies   Follow-up lipids and LFT in 4 to 6 weeks.  Duration of Discharge Encounter   Greater than 30 minutes including physician time.  Signed, Daune Perch, NP 09/13/2018, 12:50 PM   Attending Note:   The patient was seen and examined.  Agree with assessment and plan as noted above.  Changes made to the above note as needed.  Patient seen and independently examined with Pecolia Ades, NP .   We discussed all aspects of the encounter. I agree with the assessment and plan as stated above.  1.  Coronary artery disease: The patient had atherectomy and stenting of his LAD and diagonal vessel.  He has done very well.  Very nicely was able to achieve a very nice result with a very complex and severely diseased LAD. Continue with aspirin, Plavix, Eliquis.  We will discontinue the aspirin in 1 month.  Follow-up with Dr.Krasowski in  our [place] office.   2.  Paroxysmal atrial fibrillation: New Eliquis.  To new metoprolol.  3.  Hypertension: Continue current medications.  .  Thoracic aortic aneurysm: He has a 4.1 cm thoracic aneurysm.  Outpatient follow-up.   I have spent a total of 40 minutes with patient reviewing hospital  notes , telemetry, EKGs, labs and examining patient as well as establishing an assessment and plan that was discussed with the patient. > 50% of time was spent in direct patient care.    Thayer Headings, Brooke Bonito., MD, [facility] 09/13/2018, 12:50 PM [address]  [place] Pager [phone]

## 2018-09-13 NOTE — Consult Note (Signed)
            [facility] CM Primary Care Navigator  09/13/2018  ZYSHAWN BOHNENKAMP [birth year] [id number]   Went to see patient in the room to identify possible discharge needs buthewasalready dischargedhome per staff report.  Per MD note,patient transferred from [facility] with unstable angina for cardiac catheterization related to tightness in his chest. Stress echocardiogram showed distal septal and inferior ischemia. (unstable angina status post cardiac catheterization and stenting; coronary artery disease; atrial fibrillation)  Patient has discharge instruction tofollow-up withcardiology on 09/23/18.   Primary care provider's officeis listed as providing transition of care (TOC).   For additional questions please contact:  Edwena Felty A. Teyon Odette, BSN, RN-BC [facility] PRIMARY CARE Navigator Cell: [phone]

## 2018-09-13 NOTE — Progress Notes (Signed)
CARDIAC REHAB PHASE I   PRE:  Rate/Rhythm: 69 SR  BP:  Sitting: 117/78      SaO2: 95 RA  MODE:  Ambulation: 1000 ft   POST:  Rate/Rhythm: 78 SR  BP:  Sitting: 150/78    SaO2: 96 RA   Pt ambulated 108ft in hallway handheld assist. Pt with staggering gait, states he has done that his whole life. Pt denies CP, but stated he had slight SOB upon return to room. Pt and family educated on importance of ASA, Plavix, and NTG. Pt given heart healthy diet. Reviewed restrictions and exercise guidelines. Will refer to CRP II [place].  [id number] Rufina Falco, RN BSN 09/13/2018 9:15 AM

## 2018-09-13 NOTE — Discharge Instructions (Signed)
Radial Site Care ° °This sheet gives you information about how to care for yourself after your procedure. Your health care provider may also give you more specific instructions. If you have problems or questions, contact your health care provider. °What can I expect after the procedure? °After the procedure, it is common to have: °· Bruising and tenderness at the catheter insertion area. °Follow these instructions at home: °Medicines °· Take over-the-counter and prescription medicines only as told by your health care provider. °Insertion site care °· Follow instructions from your health care provider about how to take care of your insertion site. Make sure you: °? Wash your hands with soap and water before you change your bandage (dressing). If soap and water are not available, use hand sanitizer. °? Change your dressing as told by your health care provider. °? Leave stitches (sutures), skin glue, or adhesive strips in place. These skin closures may need to stay in place for 2 weeks or longer. If adhesive strip edges start to loosen and curl up, you may trim the loose edges. Do not remove adhesive strips completely unless your health care provider tells you to do that. °· Check your insertion site every day for signs of infection. Check for: °? Redness, swelling, or pain. °? Fluid or blood. °? Pus or a bad smell. °? Warmth. °· Do not take baths, swim, or use a hot tub until your health care provider approves. °· You may shower 24-48 hours after the procedure, or as directed by your health care provider. °? Remove the dressing and gently wash the site with plain soap and water. °? Pat the area dry with a clean towel. °? Do not rub the site. That could cause bleeding. °· Do not apply powder or lotion to the site. °Activity ° °· For 24 hours after the procedure, or as directed by your health care provider: °? Do not flex or bend the affected arm. °? Do not push or pull heavy objects with the affected arm. °? Do not  drive yourself home from the hospital or clinic. You may drive 24 hours after the procedure unless your health care provider tells you not to. °? Do not operate machinery or power tools. °· Do not lift anything that is heavier than 10 lb (4.5 kg), or the limit that you are told, until your health care provider says that it is safe. °· Ask your health care provider when it is okay to: °? Return to work or school. °? Resume usual physical activities or sports. °? Resume sexual activity. °General instructions °· If the catheter site starts to bleed, raise your arm and put firm pressure on the site. If the bleeding does not stop, get help right away. This is a medical emergency. °· If you went home on the same day as your procedure, a responsible adult should be with you for the first 24 hours after you arrive home. °· Keep all follow-up visits as told by your health care provider. This is important. °Contact a health care provider if: °· You have a fever. °· You have redness, swelling, or yellow drainage around your insertion site. °Get help right away if: °· You have unusual pain at the radial site. °· The catheter insertion area swells very fast. °· The insertion area is bleeding, and the bleeding does not stop when you hold steady pressure on the area. °· Your arm or hand becomes pale, cool, tingly, or numb. °These symptoms may represent a serious problem   that is an emergency. Do not wait to see if the symptoms will go away. Get medical help right away. Call your local emergency services (911 in the [place] Do not drive yourself to the hospital. °Summary °· After the procedure, it is common to have bruising and tenderness at the site. °· Follow instructions from your health care provider about how to take care of your radial site wound. Check the wound every day for signs of infection. °· Do not lift anything that is heavier than 10 lb (4.5 kg), or the limit that you are told, until your health care provider says  that it is safe. °This information is not intended to replace advice given to you by your health care provider. Make sure you discuss any questions you have with your health care provider. °Document Released: 08/29/2010 Document Revised: 09/01/2017 Document Reviewed: 09/01/2017 °Elsevier Interactive Patient Education © 2019 [employer] ° °

## 2018-09-23 ENCOUNTER — Encounter: Payer: Self-pay | Admitting: Cardiology

## 2018-09-23 ENCOUNTER — Ambulatory Visit (INDEPENDENT_AMBULATORY_CARE_PROVIDER_SITE_OTHER): Payer: Medicare Other | Admitting: Cardiology

## 2018-09-23 VITALS — BP 124/68 | HR 84 | Ht 72.0 in | Wt 195.2 lb

## 2018-09-23 DIAGNOSIS — I480 Paroxysmal atrial fibrillation: Secondary | ICD-10-CM | POA: Diagnosis not present

## 2018-09-23 DIAGNOSIS — I2510 Atherosclerotic heart disease of native coronary artery without angina pectoris: Secondary | ICD-10-CM

## 2018-09-23 DIAGNOSIS — I7121 Aneurysm of the ascending aorta, without rupture: Secondary | ICD-10-CM

## 2018-09-23 DIAGNOSIS — I10 Essential (primary) hypertension: Secondary | ICD-10-CM

## 2018-09-23 DIAGNOSIS — I712 Thoracic aortic aneurysm, without rupture: Secondary | ICD-10-CM

## 2018-09-23 NOTE — Progress Notes (Signed)
Cardiology Office Note:    Date:  09/23/2018   ID:  Ronald Bird, DOB August 11, 1935, MRN [id number]  PCP:  Street, Sharon Mt, MD  Cardiologist:  Jenne Campus, MD    Referring MD: Street, Sharon Mt, *   Chief Complaint  Patient presents with  . Follow-up  Doing much better I was in the hospital  History of Present Illness:    Ronald Bird is a 83 y.o. male with coronary artery disease recently week ago he ended up having angioplasty of LAD and diagonal branch done that required quite complex intervention.  Contemplation was given for potentially doing bypass surgery.  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  He is on triple therapy he is on Eliquis Plavix as well as aspirin will continue with this for a month and then months later we will discontinue aspirin in the future we will may discontinue Plavix which will be a year later.  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  Past Medical History:  Diagnosis Date  . BPH (benign prostatic hyperplasia)   . Coronary artery disease   . DJD (degenerative joint disease)   . GERD (gastroesophageal reflux disease)   . History of colonic polyps   . Hypercholesteremia   . Hypertension     Past Surgical History:  Procedure Laterality Date  . CATARACT EXTRACTION, BILATERAL  2012   In [facility]  . CORONARY ATHERECTOMY N/A 09/12/2018   Procedure: CORONARY ATHERECTOMY;  Surgeon: Jettie Booze, MD;  Location: [place] [facility];  Service: Cardiovascular;  Laterality: N/A;  mid LAD  . CORONARY BALLOON ANGIOPLASTY N/A 09/12/2018   Procedure:  CORONARY BALLOON ANGIOPLASTY;  Surgeon: Jettie Booze, MD;  Location: [place] [facility];  Service: Cardiovascular;  Laterality: N/A;  diag 1  . CORONARY STENT INTERVENTION N/A 09/12/2018   Procedure: CORONARY STENT INTERVENTION;  Surgeon: Jettie Booze, MD;  Location: [place] [facility];  Service: Cardiovascular;  Laterality: N/A;  mid LAD  . LEFT HEART CATH AND CORONARY ANGIOGRAPHY N/A 09/09/2018   Procedure: LEFT HEART CATH AND CORONARY ANGIOGRAPHY;  Surgeon: Burnell Blanks, MD;  Location: [place] [facility];  Service: Cardiovascular;  Laterality: N/A;  . LEFT HEART CATH AND CORONARY ANGIOGRAPHY N/A 09/12/2018   Procedure: LEFT HEART CATH AND CORONARY ANGIOGRAPHY;  Surgeon: Jettie Booze, MD;  Location: [place] [facility];  Service: Cardiovascular;  Laterality: N/A;  . MOHS SURGERY  2012   Skin Cancer removed from nose by Moh's in [place]  . SHOULDER SURGERY      Current Medications: Current Meds  Medication Sig  . apixaban (ELIQUIS) 5 MG TABS tablet Take 1 tablet (5 mg total) by mouth 2 (two) times daily.  Marland Kitchen azelastine (ASTELIN) 0.1 % nasal spray Place 2 sprays into both nostrils as needed for allergies.  . calcium carbonate (TUMS - DOSED IN MG ELEMENTAL CALCIUM) 500 MG chewable tablet Chew 1 tablet by mouth as needed for indigestion or heartburn.  . Cinnamon 500 MG TABS Take 2 tablets by  mouth daily.    . clopidogrel (PLAVIX) 75 MG tablet Take 1 tablet (75 mg total) by mouth daily with breakfast.  . Cyanocobalamin (B-12) 2500 MCG SUBL Place 1 tablet under the tongue daily.  Marland Kitchen docusate sodium (COLACE) 100 MG capsule Take 100 mg by mouth daily.  Marland Kitchen ezetimibe (ZETIA) 10 MG tablet Take 1 tablet (10 mg total) by mouth daily.  . fish oil-omega-3 fatty acids 1000 MG capsule Take 2 g by mouth 3 (three) times daily.    . fluticasone (FLONASE) 50 MCG/ACT nasal spray Place 2 sprays into both nostrils daily for 30 days.  Marland Kitchen glucosamine-chondroitin 500-400 MG tablet Take 1  tablet by mouth daily.    . hydrochlorothiazide (HYDRODIURIL) 12.5 MG tablet Take 12.5 mg by mouth daily.  . Lactobacillus (PROBIOTIC ACIDOPHILUS PO) Take 1 tablet by mouth daily.  Marland Kitchen levocetirizine (XYZAL) 5 MG tablet Take 5 mg by mouth every evening.  Marland Kitchen levothyroxine (SYNTHROID, LEVOTHROID) 25 MCG tablet Take 25 mcg by mouth daily before breakfast.  . lisinopril (PRINIVIL,ZESTRIL) 40 MG tablet Take 1 tablet (40 mg total) by mouth daily.  Marland Kitchen loratadine (CLARITIN) 10 MG tablet Take 1 tablet (10 mg total) by mouth daily for 30 days.  . Lycopene 10 MG CAPS Take 1 capsule by mouth daily.    . magnesium oxide (MAG-OX) 400 MG tablet Take 400 mg by mouth daily.  . Methylcellulose, Laxative, (CITRUCEL PO) Take by mouth.  . Multiple Vitamin (MULTIVITAMIN) capsule Take 1 capsule by mouth daily.    . nitroGLYCERIN (NITROSTAT) 0.4 MG SL tablet Place 1 tablet (0.4 mg total) under the tongue every 5 (five) minutes as needed for chest pain.  . pantoprazole (PROTONIX) 40 MG tablet Take 1 tablet (40 mg total) by mouth daily. 30 mins before a meal   . rosuvastatin (CRESTOR) 10 MG tablet Take 1 tablet (10 mg total) by mouth daily at 6 PM.  . Saw Palmetto, Serenoa repens, 1000 MG CAPS Take 2 capsules by mouth 2 (two) times daily.    . Vitamin D, Ergocalciferol, (DRISDOL) 50000 units CAPS capsule Take 50,000 Units by mouth every Tuesday.      Allergies:   Patient has no known allergies.   Social History   Socioeconomic History  . Marital status: Married    Spouse name: [name]  . Number of children: 2  . Years of education: Not on file  . Highest education level: Not on file  Occupational History  . Not on file  Social Needs  . Financial resource strain: Not on file  . Food insecurity:    Worry: Not on file    Inability: Not on file  . Transportation needs:    Medical: Not on file    Non-medical: Not on file  Tobacco Use  . Smoking status: Never Smoker  . Smokeless tobacco: Never Used  Substance and  Sexual Activity  . Alcohol use: No  . Drug use: No  . Sexual activity: Not on file  Lifestyle  . Physical activity:    Days per week: Not on file    Minutes per session: Not on file  . Stress: Not on file  Relationships  . Social connections:    Talks on phone: Not on file    Gets together: Not on file    Attends religious service: Not on file    Active member of club or organization: Not on file    Attends meetings of clubs or organizations: Not on file  Relationship status: Not on file  Other Topics Concern  . Not on file  Social History Narrative  . Not on file     Family History: The patient's family history includes Atrial fibrillation in his brother; CAD in his sister; Heart disease in his father and mother; Hypertension in his father and mother; Stroke in his father and mother. ROS:   Please see the history of present illness.    All 14 point review of systems negative except as described per history of present illness  EKGs/Labs/Other Studies Reviewed:    Cardiac catheterization from February 2020:  Dist LM lesion is 40% stenosed.  1st Diag lesion is 90% stenosed.  Balloon angioplasty was performed using a BALLOON SAPPHIRE 2.0X15.  Post intervention, there is a 40% residual stenosis.  Mid LAD-1 lesion is 90% stenosed. Successful atherectomy with CSI device.  A drug-eluting stent was successfully placed using a STENT ORSIRO 2.75X35.  Post intervention, there is a 0% residual stenosis.  Mid LAD-2 lesion is 75% stenosed.  A drug-eluting stent was successfully placed using a STENT ORSIRO 2.5X13. Overlaps the more proximal stent. Overlap inflated to high pressure with stent balloon.  Post intervention, there is a 0% residual stenosis.  LV end diastolic pressure is normal.  There is no aortic valve stenosis.   Attempted to cross with Synergy stent but this did not cross.  [place]iro eventually crossed with [facility] wire.   Recent Labs: 09/13/2018: BUN 17;  Creatinine, Ser 1.11; Hemoglobin 13.6; Platelets 180; Potassium 4.3; Sodium 133  Recent Lipid Panel    Component Value Date/Time   CHOL 158 12/30/2010 1038   TRIG 135.0 12/30/2010 1038   HDL 40.40 12/30/2010 1038   CHOLHDL 4 12/30/2010 1038   VLDL 27.0 12/30/2010 1038   LDLCALC 91 12/30/2010 1038   LDLDIRECT 146.6 07/03/2009 0947    Physical Exam:    VS:  BP 124/68   Pulse 84   Ht 6' (1.829 m)   Wt 195 lb 3.2 oz (88.5 kg)   SpO2 97%   BMI 26.47 kg/m     Wt Readings from Last 3 Encounters:  09/23/18 195 lb 3.2 oz (88.5 kg)  09/13/18 185 lb 3 oz (84 kg)  03/17/18 196 lb 3.2 oz (89 kg)     GEN:  Well nourished, well developed in no acute distress HEENT: Normal NECK: No JVD; No carotid bruits LYMPHATICS: No lymphadenopathy CARDIAC: RRR, no murmurs, no rubs, no gallops RESPIRATORY:  Clear to auscultation without rales, wheezing or rhonchi  ABDOMEN: Soft, non-tender, non-distended MUSCULOSKELETAL:  No edema; No deformity  SKIN: Warm and dry LOWER EXTREMITIES: no swelling NEUROLOGIC:  Alert and oriented x 3 PSYCHIATRIC:  Normal affect   ASSESSMENT:    1. Paroxysmal atrial fibrillation (HCC)   2. Coronary artery disease involving native coronary artery of native heart without angina pectoris   3. Essential hypertension   4. Ascending aortic aneurysm (HCC)    PLAN:    In order of problems listed above:  1. Paroxysmal atrial fibrillation denies having any we will continue anticoagulation. 2. Coronary artery disease status post complex angioplasty of LAD and diagonal doing well in triple therapy.  Will continue.  Asymptomatic. 3. Essential hypertension blood pressure well controlled we will continue present management. 4. Ascending arctic aneurysm noted continue monitoring.  Complex case review records from the hospital.   Medication Adjustments/Labs and Tests Ordered: Current medicines are reviewed at length with the patient today.  Concerns regarding medicines are  outlined above.  No orders of the defined types were placed in this encounter.  Medication changes: No orders of the defined types were placed in this encounter.   Signed, Park Liter, MD, [facility] 09/23/2018 2:30 PM    [place]

## 2018-09-23 NOTE — Patient Instructions (Signed)
Medication Instructions:  Your physician recommends that you continue on your current medications as directed. Please refer to the Current Medication list given to you today.  If you need a refill on your cardiac medications before your next appointment, please call your pharmacy.   Lab work: None.  If you have labs (blood work) drawn today and your tests are completely normal, you will receive your results only by: . MyChart Message (if you have MyChart) OR . A paper copy in the mail If you have any lab test that is abnormal or we need to change your treatment, we will call you to review the results.  Testing/Procedures: None.   Follow-Up: At [facility], you and your health needs are our priority.  As part of our continuing mission to provide you with exceptional heart care, we have created designated Provider Care Teams.  These Care Teams include your primary Cardiologist (physician) and Advanced Practice Providers (APPs -  Physician Assistants and Nurse Practitioners) who all work together to provide you with the care you need, when you need it. You will need a follow up appointment in 1 months.  Please call our office 2 months in advance to schedule this appointment.  You may see Robert Krasowski, MD or another member of our [facility] Provider Team in [place]: Brian Munley, MD . Rajan Revankar, MD  Any Other Special Instructions Will Be Listed Below (If Applicable).     

## 2018-10-03 ENCOUNTER — Other Ambulatory Visit: Payer: Self-pay | Admitting: Cardiology

## 2018-10-04 DIAGNOSIS — Z955 Presence of coronary angioplasty implant and graft: Secondary | ICD-10-CM | POA: Diagnosis not present

## 2018-10-04 DIAGNOSIS — I10 Essential (primary) hypertension: Secondary | ICD-10-CM | POA: Diagnosis not present

## 2018-10-04 DIAGNOSIS — I480 Paroxysmal atrial fibrillation: Secondary | ICD-10-CM | POA: Diagnosis not present

## 2018-10-04 DIAGNOSIS — I2510 Atherosclerotic heart disease of native coronary artery without angina pectoris: Secondary | ICD-10-CM | POA: Diagnosis not present

## 2018-10-05 DIAGNOSIS — I10 Essential (primary) hypertension: Secondary | ICD-10-CM | POA: Diagnosis not present

## 2018-10-05 DIAGNOSIS — Z955 Presence of coronary angioplasty implant and graft: Secondary | ICD-10-CM | POA: Diagnosis not present

## 2018-10-05 DIAGNOSIS — I2510 Atherosclerotic heart disease of native coronary artery without angina pectoris: Secondary | ICD-10-CM | POA: Diagnosis not present

## 2018-10-05 DIAGNOSIS — I480 Paroxysmal atrial fibrillation: Secondary | ICD-10-CM | POA: Diagnosis not present

## 2018-10-06 ENCOUNTER — Ambulatory Visit: Payer: Medicare Other | Admitting: Cardiology

## 2018-10-07 DIAGNOSIS — Z955 Presence of coronary angioplasty implant and graft: Secondary | ICD-10-CM | POA: Diagnosis not present

## 2018-10-07 DIAGNOSIS — I480 Paroxysmal atrial fibrillation: Secondary | ICD-10-CM | POA: Diagnosis not present

## 2018-10-07 DIAGNOSIS — I2510 Atherosclerotic heart disease of native coronary artery without angina pectoris: Secondary | ICD-10-CM | POA: Diagnosis not present

## 2018-10-07 DIAGNOSIS — I10 Essential (primary) hypertension: Secondary | ICD-10-CM | POA: Diagnosis not present

## 2018-10-10 DIAGNOSIS — Z955 Presence of coronary angioplasty implant and graft: Secondary | ICD-10-CM | POA: Diagnosis not present

## 2018-10-12 DIAGNOSIS — Z955 Presence of coronary angioplasty implant and graft: Secondary | ICD-10-CM | POA: Diagnosis not present

## 2018-10-14 DIAGNOSIS — Z955 Presence of coronary angioplasty implant and graft: Secondary | ICD-10-CM | POA: Diagnosis not present

## 2018-10-17 ENCOUNTER — Other Ambulatory Visit: Payer: Self-pay | Admitting: Cardiology

## 2018-10-17 DIAGNOSIS — Z955 Presence of coronary angioplasty implant and graft: Secondary | ICD-10-CM | POA: Diagnosis not present

## 2018-10-19 ENCOUNTER — Other Ambulatory Visit: Payer: Self-pay | Admitting: Cardiology

## 2018-10-19 DIAGNOSIS — Z955 Presence of coronary angioplasty implant and graft: Secondary | ICD-10-CM | POA: Diagnosis not present

## 2018-10-21 DIAGNOSIS — Z955 Presence of coronary angioplasty implant and graft: Secondary | ICD-10-CM | POA: Diagnosis not present

## 2018-10-24 DIAGNOSIS — Z955 Presence of coronary angioplasty implant and graft: Secondary | ICD-10-CM | POA: Diagnosis not present

## 2018-10-25 ENCOUNTER — Other Ambulatory Visit: Payer: Self-pay | Admitting: Cardiology

## 2018-10-26 ENCOUNTER — Ambulatory Visit: Payer: Medicare Other | Admitting: Cardiology

## 2018-10-26 DIAGNOSIS — Z955 Presence of coronary angioplasty implant and graft: Secondary | ICD-10-CM | POA: Diagnosis not present

## 2018-10-28 DIAGNOSIS — Z955 Presence of coronary angioplasty implant and graft: Secondary | ICD-10-CM | POA: Diagnosis not present

## 2018-11-07 DIAGNOSIS — L570 Actinic keratosis: Secondary | ICD-10-CM | POA: Diagnosis not present

## 2018-11-07 DIAGNOSIS — L219 Seborrheic dermatitis, unspecified: Secondary | ICD-10-CM | POA: Diagnosis not present

## 2018-11-07 DIAGNOSIS — L300 Nummular dermatitis: Secondary | ICD-10-CM | POA: Diagnosis not present

## 2018-11-07 DIAGNOSIS — B354 Tinea corporis: Secondary | ICD-10-CM | POA: Diagnosis not present

## 2018-12-08 ENCOUNTER — Telehealth (INDEPENDENT_AMBULATORY_CARE_PROVIDER_SITE_OTHER): Payer: Medicare Other | Admitting: Cardiology

## 2018-12-08 ENCOUNTER — Other Ambulatory Visit: Payer: Self-pay

## 2018-12-08 VITALS — BP 117/71 | HR 57 | Temp 98.1°F | Wt 186.8 lb

## 2018-12-08 DIAGNOSIS — I10 Essential (primary) hypertension: Secondary | ICD-10-CM

## 2018-12-08 DIAGNOSIS — I712 Thoracic aortic aneurysm, without rupture: Secondary | ICD-10-CM

## 2018-12-08 DIAGNOSIS — I2510 Atherosclerotic heart disease of native coronary artery without angina pectoris: Secondary | ICD-10-CM

## 2018-12-08 DIAGNOSIS — I480 Paroxysmal atrial fibrillation: Secondary | ICD-10-CM

## 2018-12-08 DIAGNOSIS — I7121 Aneurysm of the ascending aorta, without rupture: Secondary | ICD-10-CM

## 2018-12-08 DIAGNOSIS — E7800 Pure hypercholesterolemia, unspecified: Secondary | ICD-10-CM

## 2018-12-08 MED ORDER — METOPROLOL TARTRATE 25 MG PO TABS: 25.0000 mg | ORAL_TABLET | Freq: Two times a day (BID) | ORAL | 1 refills | Status: AC

## 2018-12-08 MED ORDER — APIXABAN 5 MG PO TABS: 5.0000 mg | ORAL_TABLET | Freq: Two times a day (BID) | ORAL | 2 refills | Status: AC

## 2018-12-08 MED ORDER — HYDROCHLOROTHIAZIDE 12.5 MG PO CAPS: 12.5000 mg | ORAL_CAPSULE | Freq: Every day | ORAL | 1 refills | Status: DC

## 2018-12-08 MED ORDER — ROSUVASTATIN CALCIUM 10 MG PO TABS: 10.0000 mg | ORAL_TABLET | Freq: Every day | ORAL | 3 refills | Status: DC

## 2018-12-08 MED ORDER — LISINOPRIL 40 MG PO TABS: 40.0000 mg | ORAL_TABLET | Freq: Every day | ORAL | 2 refills | Status: DC

## 2018-12-08 MED ORDER — CLOPIDOGREL BISULFATE 75 MG PO TABS: 75.0000 mg | ORAL_TABLET | Freq: Every day | ORAL | 1 refills | Status: AC

## 2018-12-08 MED ORDER — EZETIMIBE 10 MG PO TABS: 10.0000 mg | ORAL_TABLET | Freq: Every day | ORAL | 2 refills | Status: DC

## 2018-12-08 NOTE — Patient Instructions (Signed)
Medication Instructions:  Your physician recommends that you continue on your current medications as directed. Please refer to the Current Medication list given to you today.  If you need a refill on your cardiac medications before your next appointment, please call your pharmacy.   Lab work: None If you have labs (blood work) drawn today and your tests are completely normal, you will receive your results only by: . MyChart Message (if you have MyChart) OR . A paper copy in the mail If you have any lab test that is abnormal or we need to change your treatment, we will call you to review the results.  Testing/Procedures: None  Follow-Up: At [facility], you and your health needs are our priority.  As part of our continuing mission to provide you with exceptional heart care, we have created designated Provider Care Teams.  These Care Teams include your primary Cardiologist (physician) and Advanced Practice Providers (APPs -  Physician Assistants and Nurse Practitioners) who all work together to provide you with the care you need, when you need it. You will need a follow up appointment in 5 months Any Other Special Instructions Will Be Listed Below (If Applicable).    

## 2018-12-08 NOTE — Progress Notes (Signed)
Virtual Visit via Video Note   This visit type was conducted due to national recommendations for restrictions regarding the COVID-19 Pandemic (e.g. social distancing) in an effort to limit this patient's exposure and mitigate transmission in our community.  Due to his co-morbid illnesses, this patient is at least at moderate risk for complications without adequate follow up.  This format is felt to be most appropriate for this patient at this time.  All issues noted in this document were discussed and addressed.  A limited physical exam was performed with this format.  Please refer to the patient's chart for his consent to telehealth for [facility].  Evaluation Performed:  Follow-up visit  This visit type was conducted due to national recommendations for restrictions regarding the COVID-19 Pandemic (e.g. social distancing).  This format is felt to be most appropriate for this patient at this time.  All issues noted in this document were discussed and addressed.  No physical exam was performed (except for noted visual exam findings with Video Visits).  Please refer to the patient's chart (MyChart message for video visits and phone note for telephone visits) for the patient's consent to telehealth for [facility].  Date:  12/08/2018  ID: Scharlene Gloss, DOB May 01, 1936, MRN [id number]   Patient Location: [place] [address]   Provider location:   [place] Office  PCP:  Street, Sharon Mt, MD  Cardiologist:  Jenne Campus, MD     Chief Complaint:  1 month f/u CAD, PAF. Feels well.  History of Present Illness:    TERRYN REDNER is a 83 y.o. male  who presents via audio/video conferencing for a telehealth visit today.  PMH HTN, PAF (anticoagulated on Eliquis), AAA, CAD s/p complex angioplasty to LAD/diagonal in Feb 2020. He is without acute complaint. Denies chest pain/burning/pressure, shortness of breath, palpitations. Has been exercising by  walking. Reports his blood pressure is well when checked at home. I reviewed an EKG he sent via apple watch which was without concern.   S/P complex angioplasty to LAD/diagonal in Feb 2020. Triple therapy Eliquis, Plavix, Aspirin x1 month. Has now been on dual therapy of Plavix and Eliquis. No reported bleeding.   Echo 09/2018 shows EF 55-60%, milk TR/AR, mild thickening/calcification of aortic valve.   The patient does not have symptoms concerning for COVID-19 infection (fever, chills, cough, or new SHORTNESS OF BREATH).    Prior CV studies:   The following studies were reviewed today:     Past Medical History:  Diagnosis Date   BPH (benign prostatic hyperplasia)    Coronary artery disease    DJD (degenerative joint disease)    GERD (gastroesophageal reflux disease)    History of colonic polyps    Hypercholesteremia    Hypertension     Past Surgical History:  Procedure Laterality Date   CATARACT EXTRACTION, BILATERAL  2012   In [place] N/A 09/12/2018   Procedure: CORONARY ATHERECTOMY;  Surgeon: Jettie Booze, MD;  Location: [place] [facility];  Service: Cardiovascular;  Laterality: N/A;  mid LAD   CORONARY BALLOON ANGIOPLASTY N/A 09/12/2018   Procedure: CORONARY BALLOON ANGIOPLASTY;  Surgeon: Jettie Booze, MD;  Location: [place] [facility];  Service: Cardiovascular;  Laterality: N/A;  diag 1   CORONARY STENT INTERVENTION N/A 09/12/2018   Procedure: CORONARY STENT INTERVENTION;  Surgeon: Jettie Booze, MD;  Location: [place] [facility];  Service: Cardiovascular;  Laterality: N/A;  mid LAD   LEFT HEART CATH AND CORONARY ANGIOGRAPHY N/A 09/09/2018   Procedure: LEFT HEART CATH AND CORONARY ANGIOGRAPHY;  Surgeon: Burnell Blanks, MD;  Location: [place] [facility];  Service: Cardiovascular;  Laterality: N/A;   LEFT HEART CATH AND CORONARY ANGIOGRAPHY N/A 09/12/2018   Procedure: LEFT HEART CATH AND CORONARY ANGIOGRAPHY;   Surgeon: Jettie Booze, MD;  Location: [place] [facility];  Service: Cardiovascular;  Laterality: N/A;   MOHS SURGERY  2012   Skin Cancer removed from nose by Moh's in [place]       Current Meds  Medication Sig   apixaban (ELIQUIS) 5 MG TABS tablet Take 1 tablet (5 mg total) by mouth 2 (two) times daily.   azelastine (ASTELIN) 0.1 % nasal spray Place 2 sprays into both nostrils as needed for allergies.   calcium carbonate (TUMS - DOSED IN MG ELEMENTAL CALCIUM) 500 MG chewable tablet Chew 1 tablet by mouth as needed for indigestion or heartburn.   Cinnamon 500 MG TABS Take 2 tablets by mouth daily.     clopidogrel (PLAVIX) 75 MG tablet Take 1 tablet (75 mg total) by mouth daily with breakfast.   Cyanocobalamin (B-12) 2500 MCG SUBL Place 1 tablet under the tongue daily.   docusate sodium (COLACE) 100 MG capsule Take 100 mg by mouth daily.   ezetimibe (ZETIA) 10 MG tablet Take 1 tablet (10 mg total) by mouth daily.   fish oil-omega-3 fatty acids 1000 MG capsule Take 2 g by mouth 3 (three) times daily.     fluticasone (FLONASE) 50 MCG/ACT nasal spray Place 2 sprays into both nostrils daily for 30 days.   glucosamine-chondroitin 500-400 MG tablet Take 1 tablet by mouth daily.     hydrochlorothiazide (MICROZIDE) 12.5 MG capsule TAKE 1 CAPSULE BY MOUTH ONCE DAILY.   Lactobacillus (PROBIOTIC ACIDOPHILUS PO) Take 1 tablet by mouth daily.   levocetirizine (XYZAL) 5 MG tablet Take 5 mg by mouth every evening.   levothyroxine (SYNTHROID, LEVOTHROID) 25 MCG tablet Take 25 mcg by mouth daily before breakfast.   lisinopril (PRINIVIL,ZESTRIL) 40 MG tablet TAKE 1 TABLET BY MOUTH ONCE DAILY.   loratadine (CLARITIN) 10 MG tablet Take 1 tablet (10 mg total) by mouth daily for 30 days.   Lycopene 10 MG CAPS Take 1 capsule by mouth daily.     magnesium oxide (MAG-OX) 400 MG tablet Take 400 mg by mouth daily.   Methylcellulose, Laxative, (CITRUCEL PO) Take by  mouth.   metoprolol tartrate (LOPRESSOR) 25 MG tablet TAKE 1 TABLET BY MOUTH TWICE DAILY.   Multiple Vitamin (MULTIVITAMIN) capsule Take 1 capsule by mouth daily.     nitroGLYCERIN (NITROSTAT) 0.4 MG SL tablet Place 1 tablet (0.4 mg total) under the tongue every 5 (five) minutes as needed for chest pain.   pantoprazole (PROTONIX) 40 MG tablet Take 1 tablet (40 mg total) by mouth daily. 30 mins before a meal    rosuvastatin (CRESTOR) 10 MG tablet Take 1 tablet (10 mg total) by mouth daily at 6 PM.   Saw Palmetto, Serenoa repens, 1000 MG CAPS Take 2 capsules by mouth 2 (two) times daily.     Vitamin D, Ergocalciferol, (DRISDOL) 50000 units CAPS capsule Take 50,000 Units by mouth every Tuesday.       Family History: The patient's family history includes Atrial fibrillation in his brother; CAD in his sister; Heart disease in his father and mother; Hypertension in his father and mother; Stroke in his father  and mother.   ROS:   Please see the history of present illness.     All other systems reviewed and are negative.   Labs/Other Tests and Data Reviewed:     Recent Labs: 09/13/2018: BUN 17; Creatinine, Ser 1.11; Hemoglobin 13.6; Platelets 180; Potassium 4.3; Sodium 133  Recent Lipid Panel    Component Value Date/Time   CHOL 158 12/30/2010 1038   TRIG 135.0 12/30/2010 1038   HDL 40.40 12/30/2010 1038   CHOLHDL 4 12/30/2010 1038   VLDL 27.0 12/30/2010 1038   LDLCALC 91 12/30/2010 1038   LDLDIRECT 146.6 07/03/2009 0947      Exam:    Vital Signs:  BP 117/71    Pulse (!) 57    Temp 98.1 F (36.7 C)    Wt 186 lb 12.8 oz (84.7 kg)    BMI 25.33 kg/m     Wt Readings from Last 3 Encounters:  12/08/18 186 lb 12.8 oz (84.7 kg)  09/23/18 195 lb 3.2 oz (88.5 kg)  09/13/18 185 lb 3 oz (84 kg)     Well nourished, well developed in no acute distress. Visit today via video chat. Alert and oriented x3. He appears well.   Diagnosis for this visit:   1. Essential hypertension     2. Paroxysmal atrial fibrillation (HCC)   3. Ascending aortic aneurysm ([place])   4. Coronary artery disease involving native coronary artery of native heart without angina pectoris   5. HYPERCHOLESTEROLEMIA      ASSESSMENT & PLAN:    1.  HTN: Well controlled. Continue current regimen. 2. AAA: 4.1cm CT chest Jan 2019. Continued monitoring. Continued control of HTN. 3. PAF: Denies palpitations. Normal EKG via apple watch transmission by patient. Continue anticoagulation with Eliquis. 4. CAD s/p complex angliplasty to LAD/diagonal: No angina, stable. Continue DAPT with Plavix/Eliquis. Continue Metoprolol. 5. HLD: Tolerating Crestor 10 mg well. Advised to have his PCP check lipid panel. Will review results and consider increasing to Crestor 20mg . Continue Zetia.   COVID-19 Education: The signs and symptoms of COVID-19 were discussed with the patient and how to seek care for testing (follow up with PCP or arrange E-visit).  The importance of social distancing was discussed today.  Patient Risk:   After full review of this patients clinical status, I feel that they are at least moderate risk at this time.  Time:   Today, I have spent 15 minutes with the patient with telehealth technology discussing pt health issues.  I spent 15minutes reviewing her chart before the visit.  Visit was finished at 1057.    Medication Adjustments/Labs and Tests Ordered: Current medicines are reviewed at length with the patient today.  Concerns regarding medicines are outlined above.  No orders of the defined types were placed in this encounter.  Medication changes: No orders of the defined types were placed in this encounter.    Disposition:  3 months follow up. Review lipid panel from PCP.   Signed, Park Liter, MD, [facility] 12/08/2018 10:54 AM    [place]

## 2019-01-19 DIAGNOSIS — E559 Vitamin D deficiency, unspecified: Secondary | ICD-10-CM | POA: Diagnosis not present

## 2019-01-19 DIAGNOSIS — E782 Mixed hyperlipidemia: Secondary | ICD-10-CM | POA: Diagnosis not present

## 2019-01-19 DIAGNOSIS — I2510 Atherosclerotic heart disease of native coronary artery without angina pectoris: Secondary | ICD-10-CM | POA: Diagnosis not present

## 2019-01-19 DIAGNOSIS — I10 Essential (primary) hypertension: Secondary | ICD-10-CM | POA: Diagnosis not present

## 2019-01-19 DIAGNOSIS — Z0000 Encounter for general adult medical examination without abnormal findings: Secondary | ICD-10-CM | POA: Diagnosis not present

## 2019-01-19 DIAGNOSIS — E039 Hypothyroidism, unspecified: Secondary | ICD-10-CM | POA: Diagnosis not present

## 2019-01-19 DIAGNOSIS — N401 Enlarged prostate with lower urinary tract symptoms: Secondary | ICD-10-CM | POA: Diagnosis not present

## 2019-01-19 DIAGNOSIS — R739 Hyperglycemia, unspecified: Secondary | ICD-10-CM | POA: Diagnosis not present

## 2019-01-19 DIAGNOSIS — N138 Other obstructive and reflux uropathy: Secondary | ICD-10-CM | POA: Diagnosis not present

## 2019-01-19 DIAGNOSIS — I4891 Unspecified atrial fibrillation: Secondary | ICD-10-CM | POA: Diagnosis not present

## 2019-01-19 DIAGNOSIS — Z79899 Other long term (current) drug therapy: Secondary | ICD-10-CM | POA: Diagnosis not present

## 2019-02-07 DIAGNOSIS — L570 Actinic keratosis: Secondary | ICD-10-CM | POA: Diagnosis not present

## 2019-02-07 DIAGNOSIS — C44311 Basal cell carcinoma of skin of nose: Secondary | ICD-10-CM | POA: Diagnosis not present

## 2019-02-07 DIAGNOSIS — L821 Other seborrheic keratosis: Secondary | ICD-10-CM | POA: Diagnosis not present

## 2019-02-07 DIAGNOSIS — L578 Other skin changes due to chronic exposure to nonionizing radiation: Secondary | ICD-10-CM | POA: Diagnosis not present

## 2019-02-07 DIAGNOSIS — C44319 Basal cell carcinoma of skin of other parts of face: Secondary | ICD-10-CM | POA: Diagnosis not present

## 2019-02-20 ENCOUNTER — Telehealth: Payer: Self-pay | Admitting: *Deleted

## 2019-02-20 NOTE — Telephone Encounter (Signed)
Pt is having place on face taken off that is cancerous. Please advise about blood thinner. Surgery is Wed. 7/15 at 8am, Dr. Jimmye Norman on [facility] [phone]. Pt is on Plavix and Eliquis.

## 2019-02-20 NOTE — Telephone Encounter (Signed)
Telephone call to patient. States having cancer taking of face and needed directions on holding Plavix and Eliquis. Please advise

## 2019-02-21 NOTE — Telephone Encounter (Signed)
RN called patient and he states he is proceeding with procedure and the physician has been notified that he is on coags. RN advised patient that he must be off Plavix for 5 days to decrease risk for bleeding and this will inevitably have to be decided by Dr. Irish Lack. No further questions at this time.

## 2019-02-21 NOTE — Telephone Encounter (Signed)
Ronald Bird, This is Sales executive. I hope you are doing well.  This gentleman underwent coronary stenting.  He is calling  to see if this Plavix can be withheld for removal of a cancerous lesion on the face by his dermatologist.  Can you please advise me and what your thoughts at about this.  He is Dr. Agustin Cree patient and Dr. Agustin Cree is away on vacation.  He also happens to be taking Eliquis and of course we can stop that for a couple of days but I was not sure whether we can take him totally off antiplatelet therapy.  Please let me know your thoughts.  Thank you have a wonderful day and stay safe. Sunny Schlein

## 2019-02-22 DIAGNOSIS — C44319 Basal cell carcinoma of skin of other parts of face: Secondary | ICD-10-CM | POA: Diagnosis not present

## 2019-02-22 DIAGNOSIS — C44311 Basal cell carcinoma of skin of nose: Secondary | ICD-10-CM | POA: Diagnosis not present

## 2019-02-22 NOTE — Telephone Encounter (Signed)
Hi Ronald Bird. Given that stent type was Orsiro, with bioabsorbable polymer, ok to hold Plavix 5 days prior to skin procedure.  WOuld restart clopidogrel when safe from a bleeding standpoint.

## 2019-02-22 NOTE — Telephone Encounter (Signed)
Please let the patient know that he can stop the Plavix.  I am also fine holding the Eliquis.  He needs to resume this as soon as his dermatologist is okay.  Please have the patient begin 81 mg daily of coated aspirin ASAP.

## 2019-02-27 NOTE — Telephone Encounter (Signed)
Patient states he had procedure performed without stopping plavix. He states he did well with minimal bleeding and did not have any complications.

## 2019-03-16 DIAGNOSIS — R0602 Shortness of breath: Secondary | ICD-10-CM | POA: Diagnosis not present

## 2019-03-16 DIAGNOSIS — I10 Essential (primary) hypertension: Secondary | ICD-10-CM | POA: Diagnosis not present

## 2019-03-17 ENCOUNTER — Telehealth: Payer: Self-pay | Admitting: Cardiology

## 2019-03-17 NOTE — Telephone Encounter (Signed)
Called patient he reports that his blood pressure got high yesterday after walking to his mailbox he noticed it was 189/113. He went to the emergency department for this. He checked out okay there and was told to follow up with Dr. Agustin Cree. His blood pressures today before medications were 166/92 and 126/71 after medications. I have asked him to monitor for now and let us know if things get worse. I told him I would inform Dr.Krasowski and let him know if he has any recommendations.

## 2019-03-17 NOTE — Telephone Encounter (Signed)
Went to hospital with high BP and was told to call and let [place] know

## 2019-03-21 NOTE — Telephone Encounter (Signed)
Would like him to keep a BP log - check twice daily (once in the morning and once in the afternoon).  Call us if systolic BP after medications is consistently >130.  Please move his appointment with Dr. Raliegh Ip up given ED visit and phone call for HTN.  Thanks!

## 2019-03-21 NOTE — Telephone Encounter (Signed)
Called patient informed him to keep a blood pressure log and to call is systolic bp is consistently over 130 after medications. Patient verbally un understood. I offered to move his appointment up in [place] due to recent ED visit. He declined and states he will call if anything gets worse. Patient verbally understood. No further questions.

## 2019-03-23 DIAGNOSIS — J309 Allergic rhinitis, unspecified: Secondary | ICD-10-CM | POA: Diagnosis not present

## 2019-03-27 DIAGNOSIS — I10 Essential (primary) hypertension: Secondary | ICD-10-CM | POA: Diagnosis not present

## 2019-03-27 DIAGNOSIS — J329 Chronic sinusitis, unspecified: Secondary | ICD-10-CM | POA: Diagnosis not present

## 2019-03-27 DIAGNOSIS — R0989 Other specified symptoms and signs involving the circulatory and respiratory systems: Secondary | ICD-10-CM | POA: Diagnosis not present

## 2019-03-27 DIAGNOSIS — I4891 Unspecified atrial fibrillation: Secondary | ICD-10-CM | POA: Diagnosis not present

## 2019-03-27 DIAGNOSIS — E039 Hypothyroidism, unspecified: Secondary | ICD-10-CM | POA: Diagnosis not present

## 2019-03-27 DIAGNOSIS — H6123 Impacted cerumen, bilateral: Secondary | ICD-10-CM | POA: Diagnosis not present

## 2019-03-27 DIAGNOSIS — Z79899 Other long term (current) drug therapy: Secondary | ICD-10-CM | POA: Diagnosis not present

## 2019-03-27 DIAGNOSIS — R739 Hyperglycemia, unspecified: Secondary | ICD-10-CM | POA: Diagnosis not present

## 2019-04-04 DIAGNOSIS — R0989 Other specified symptoms and signs involving the circulatory and respiratory systems: Secondary | ICD-10-CM | POA: Diagnosis not present

## 2019-04-04 DIAGNOSIS — I10 Essential (primary) hypertension: Secondary | ICD-10-CM | POA: Diagnosis not present

## 2019-04-04 DIAGNOSIS — Z6824 Body mass index (BMI) 24.0-24.9, adult: Secondary | ICD-10-CM | POA: Diagnosis not present

## 2019-04-22 DIAGNOSIS — Z23 Encounter for immunization: Secondary | ICD-10-CM | POA: Diagnosis not present

## 2019-05-12 ENCOUNTER — Telehealth: Payer: Medicare Other | Admitting: Cardiology

## 2019-05-29 ENCOUNTER — Other Ambulatory Visit: Payer: Self-pay | Admitting: Cardiology

## 2019-06-08 DIAGNOSIS — L570 Actinic keratosis: Secondary | ICD-10-CM | POA: Diagnosis not present

## 2019-06-08 DIAGNOSIS — C44319 Basal cell carcinoma of skin of other parts of face: Secondary | ICD-10-CM | POA: Diagnosis not present

## 2019-08-16 DIAGNOSIS — K838 Other specified diseases of biliary tract: Secondary | ICD-10-CM | POA: Diagnosis not present

## 2019-08-16 DIAGNOSIS — R918 Other nonspecific abnormal finding of lung field: Secondary | ICD-10-CM | POA: Diagnosis not present

## 2019-08-16 DIAGNOSIS — I712 Thoracic aortic aneurysm, without rupture: Secondary | ICD-10-CM | POA: Diagnosis not present

## 2019-08-16 DIAGNOSIS — K828 Other specified diseases of gallbladder: Secondary | ICD-10-CM | POA: Diagnosis not present

## 2019-08-25 DIAGNOSIS — K828 Other specified diseases of gallbladder: Secondary | ICD-10-CM | POA: Diagnosis not present

## 2019-08-25 DIAGNOSIS — K838 Other specified diseases of biliary tract: Secondary | ICD-10-CM | POA: Diagnosis not present

## 2019-08-25 DIAGNOSIS — K831 Obstruction of bile duct: Secondary | ICD-10-CM | POA: Diagnosis not present

## 2019-08-25 DIAGNOSIS — N3289 Other specified disorders of bladder: Secondary | ICD-10-CM | POA: Diagnosis not present

## 2019-08-29 ENCOUNTER — Other Ambulatory Visit: Payer: Self-pay

## 2019-08-29 ENCOUNTER — Telehealth: Payer: Self-pay

## 2019-08-29 DIAGNOSIS — K869 Disease of pancreas, unspecified: Secondary | ICD-10-CM

## 2019-08-29 DIAGNOSIS — K831 Obstruction of bile duct: Secondary | ICD-10-CM

## 2019-08-29 NOTE — Telephone Encounter (Signed)
EUS scheduled, pt instructed and medications reviewed.  Patient instructions sent to My Chart.  Patient to call with any questions or concerns. Eliquis communication also sent to Dr Donnal Debar.

## 2019-08-29 NOTE — Telephone Encounter (Signed)
Ronald Banister, MD  [name], RN; Mansouraty, Telford Nab., MD  Okay, I reviewed his ultrasound and also CT scan from last week, both of these were done in [place]. The ultrasound impression reads "biliary obstruction pattern at the distal common bile duct level. Etiology is not visible by sonography. This could be due to a stone or mass lesion. No gallstones visible in the gallbladder. No sonographic findings of cholecystitis. Abdominal CT with contrast would be the next best examination"   CT report "marked intra and extrahepatic biliary ductal dilation with abrupt tapering at the common bile duct at the level of the pancreatic head. There is pancreatic atrophy predominantly involving the body and tail of the pancreas with diffuse pancreatic ductal dilation. Some questionable hypoattenuation of the residual pancreatic parenchyma at the level of the pancreatic head but without clearly discernible mass lesions. Findings are worrisome for pancreatic head mass with resulting biliary obstruction versus ampullary mass. Consider further evaluation with contrast-enhanced pancreatic protocol MRI with MRCP"   Blood work from last week show total bili 0.9, AST 149, ALT 207, alk phos 270.   Looks like he has biliary obstruction, possibly from an ampullary or very small pancreatic lesion. His LFTs are not very elevated at least as of last week.    [name],  He needs endoscopic ultrasound and ERCP. I can help him with this on Thursday, February 4. If Dr. Rush Landmark has adequate space sooner than that then please book with him instead. CBC, cmet on arrival to endo as well.   Please explain to the patient that he needs to bring a copy of his CT scan on disc with him to the time of the endoscopic ultrasound and ERCP appointment.   Please let the referring [place] physician know that if the patient feels poorly prior to his endoscopic ultrasound, ERCP appointment then they need to repeat his  labs and that he could possibly at that point benefit from local ERCP.   Thanks

## 2019-09-05 NOTE — Telephone Encounter (Signed)
Response received from Dr Donnal Debar to hold Eliquis 3 days prior to the procedure.  The pt advised that he is also on plavix.  Can he hold plavix 5 days as well? I will send to Dr Donnal Debar and make a call to that office.

## 2019-09-07 ENCOUNTER — Telehealth: Payer: Self-pay | Admitting: Gastroenterology

## 2019-09-07 NOTE — Telephone Encounter (Signed)
The pt called to confirm that he can hold plavix 5 days prior to the procedure.  I made him aware that I did contact Dr Donnal Debar this morning and resent the anti coag letter to that office today.  The pt will also call Dr Donnal Debar to get a response.

## 2019-09-08 NOTE — Telephone Encounter (Signed)
Written response received from Dr Donnal Debar it is ok to hold plavix 5 days prior to the procedure.  response to be scanned into Epic.  The pt has been advised and pt verbalized understanding

## 2019-09-11 ENCOUNTER — Telehealth: Payer: Self-pay | Admitting: Gastroenterology

## 2019-09-11 ENCOUNTER — Other Ambulatory Visit (HOSPITAL_COMMUNITY)
Admission: RE | Admit: 2019-09-11 | Discharge: 2019-09-11 | Disposition: A | Discharge status: Home / Self Care | Payer: Medicare Other | Source: Ambulatory Visit | Attending: Gastroenterology | Admitting: Gastroenterology

## 2019-09-11 ENCOUNTER — Other Ambulatory Visit: Payer: Self-pay

## 2019-09-11 DIAGNOSIS — Z01812 Encounter for preprocedural laboratory examination: Secondary | ICD-10-CM | POA: Insufficient documentation

## 2019-09-11 DIAGNOSIS — K831 Obstruction of bile duct: Secondary | ICD-10-CM

## 2019-09-11 DIAGNOSIS — Z20822 Contact with and (suspected) exposure to covid-19: Secondary | ICD-10-CM | POA: Diagnosis not present

## 2019-09-11 DIAGNOSIS — K869 Disease of pancreas, unspecified: Secondary | ICD-10-CM

## 2019-09-11 LAB — SARS CORONAVIRUS 2 (TAT 6-24 HRS): SARS Coronavirus 2: NEGATIVE

## 2019-09-11 NOTE — Telephone Encounter (Signed)
Patient calling- wanted to update Dr. Ardis Hughs that he is taking Hydro chloro triazide 12.5 mg now and wanted to make sure we updated his chart with it and that Dr. Ardis Hughs was aware he was taking it. Asked for you to call him if there was any questions about it.

## 2019-09-11 NOTE — Telephone Encounter (Signed)
The pt advised that the HCTZ is in his chart for review as needed.

## 2019-09-12 ENCOUNTER — Encounter (HOSPITAL_COMMUNITY): Payer: Self-pay | Admitting: Gastroenterology

## 2019-09-12 NOTE — Progress Notes (Signed)
Called patient about his upper endo and EGD with Dr Ardis Hughs for 09/14/19. To arrive to [facility] admitting by 0830 that morning. NPO after midnight except to take metoprolol, lisinopril, and hydrochlorothiazide. He was instructed to hold his plavix for 5 days and eliquis for 3 days prior to procedure. He verbalizes understanding. His daughter will bring him for his appointment.

## 2019-09-14 ENCOUNTER — Ambulatory Visit (HOSPITAL_COMMUNITY): Payer: Medicare Other | Admitting: Certified Registered Nurse Anesthetist

## 2019-09-14 ENCOUNTER — Telehealth: Payer: Self-pay

## 2019-09-14 ENCOUNTER — Encounter (HOSPITAL_COMMUNITY)
Admission: RE | Disposition: A | Discharge status: Home / Self Care | Payer: Self-pay | Source: Home / Self Care | Attending: Gastroenterology

## 2019-09-14 ENCOUNTER — Encounter (HOSPITAL_COMMUNITY): Payer: Self-pay | Admitting: Gastroenterology

## 2019-09-14 ENCOUNTER — Ambulatory Visit (HOSPITAL_COMMUNITY)
Admission: RE | Admit: 2019-09-14 | Discharge: 2019-09-14 | Disposition: A | Discharge status: Home / Self Care | Payer: Medicare Other | Attending: Gastroenterology | Admitting: Gastroenterology

## 2019-09-14 ENCOUNTER — Ambulatory Visit (HOSPITAL_COMMUNITY): Payer: Medicare Other

## 2019-09-14 ENCOUNTER — Other Ambulatory Visit: Payer: Self-pay

## 2019-09-14 DIAGNOSIS — Z79899 Other long term (current) drug therapy: Secondary | ICD-10-CM | POA: Insufficient documentation

## 2019-09-14 DIAGNOSIS — I2510 Atherosclerotic heart disease of native coronary artery without angina pectoris: Secondary | ICD-10-CM | POA: Diagnosis not present

## 2019-09-14 DIAGNOSIS — C801 Malignant (primary) neoplasm, unspecified: Secondary | ICD-10-CM

## 2019-09-14 DIAGNOSIS — Z86010 Personal history of colonic polyps: Secondary | ICD-10-CM | POA: Insufficient documentation

## 2019-09-14 DIAGNOSIS — C250 Malignant neoplasm of head of pancreas: Secondary | ICD-10-CM | POA: Diagnosis not present

## 2019-09-14 DIAGNOSIS — I10 Essential (primary) hypertension: Secondary | ICD-10-CM | POA: Insufficient documentation

## 2019-09-14 DIAGNOSIS — M1990 Unspecified osteoarthritis, unspecified site: Secondary | ICD-10-CM | POA: Diagnosis not present

## 2019-09-14 DIAGNOSIS — Z8249 Family history of ischemic heart disease and other diseases of the circulatory system: Secondary | ICD-10-CM | POA: Insufficient documentation

## 2019-09-14 DIAGNOSIS — K8689 Other specified diseases of pancreas: Secondary | ICD-10-CM

## 2019-09-14 DIAGNOSIS — Z955 Presence of coronary angioplasty implant and graft: Secondary | ICD-10-CM | POA: Diagnosis not present

## 2019-09-14 DIAGNOSIS — Z7902 Long term (current) use of antithrombotics/antiplatelets: Secondary | ICD-10-CM | POA: Insufficient documentation

## 2019-09-14 DIAGNOSIS — N400 Enlarged prostate without lower urinary tract symptoms: Secondary | ICD-10-CM | POA: Diagnosis not present

## 2019-09-14 DIAGNOSIS — Z79890 Hormone replacement therapy (postmenopausal): Secondary | ICD-10-CM | POA: Insufficient documentation

## 2019-09-14 DIAGNOSIS — K869 Disease of pancreas, unspecified: Secondary | ICD-10-CM

## 2019-09-14 DIAGNOSIS — C259 Malignant neoplasm of pancreas, unspecified: Secondary | ICD-10-CM

## 2019-09-14 DIAGNOSIS — Z7901 Long term (current) use of anticoagulants: Secondary | ICD-10-CM | POA: Diagnosis not present

## 2019-09-14 DIAGNOSIS — K831 Obstruction of bile duct: Secondary | ICD-10-CM | POA: Diagnosis not present

## 2019-09-14 DIAGNOSIS — R52 Pain, unspecified: Secondary | ICD-10-CM

## 2019-09-14 DIAGNOSIS — K219 Gastro-esophageal reflux disease without esophagitis: Secondary | ICD-10-CM | POA: Diagnosis not present

## 2019-09-14 DIAGNOSIS — E7800 Pure hypercholesterolemia, unspecified: Secondary | ICD-10-CM | POA: Diagnosis not present

## 2019-09-14 HISTORY — PX: ENDOSCOPIC RETROGRADE CHOLANGIOPANCREATOGRAPHY (ERCP) WITH PROPOFOL: SHX5810

## 2019-09-14 HISTORY — PX: EUS: SHX5427

## 2019-09-14 HISTORY — PX: ESOPHAGOGASTRODUODENOSCOPY (EGD) WITH PROPOFOL: SHX5813

## 2019-09-14 HISTORY — PX: FINE NEEDLE ASPIRATION: SHX5430

## 2019-09-14 HISTORY — PX: BILIARY STENT PLACEMENT: SHX5538

## 2019-09-14 LAB — CBC
HCT: 37.6 % — ABNORMAL LOW (ref 39.0–52.0)
Hemoglobin: 13.2 g/dL (ref 13.0–17.0)
MCH: 33.2 pg (ref 26.0–34.0)
MCHC: 35.1 g/dL (ref 30.0–36.0)
MCV: 94.7 fL (ref 80.0–100.0)
Platelets: 141 10*3/uL — ABNORMAL LOW (ref 150–400)
RBC: 3.97 MIL/uL — ABNORMAL LOW (ref 4.22–5.81)
RDW: 13.5 % (ref 11.5–15.5)
WBC: 5.5 10*3/uL (ref 4.0–10.5)
nRBC: 0 % (ref 0.0–0.2)

## 2019-09-14 LAB — COMPREHENSIVE METABOLIC PANEL
ALT: 267 U/L — ABNORMAL HIGH (ref 0–44)
AST: 258 U/L — ABNORMAL HIGH (ref 15–41)
Albumin: 3.9 g/dL (ref 3.5–5.0)
Alkaline Phosphatase: 362 U/L — ABNORMAL HIGH (ref 38–126)
Anion gap: 9 (ref 5–15)
BUN: 14 mg/dL (ref 8–23)
CO2: 24 mmol/L (ref 22–32)
Calcium: 9 mg/dL (ref 8.9–10.3)
Chloride: 101 mmol/L (ref 98–111)
Creatinine, Ser: 0.85 mg/dL (ref 0.61–1.24)
GFR calc Af Amer: >60 mL/min (ref >60)
GFR calc non Af Amer: >60 mL/min (ref >60)
Glucose, Bld: 109 mg/dL — ABNORMAL HIGH (ref 70–99)
Potassium: 3.5 mmol/L (ref 3.5–5.1)
Sodium: 134 mmol/L — ABNORMAL LOW (ref 135–145)
Total Bilirubin: 1.3 mg/dL — ABNORMAL HIGH (ref 0.3–1.2)
Total Protein: 7.2 g/dL (ref 6.5–8.1)

## 2019-09-14 LAB — PROTIME-INR
INR: 1 (ref 0.8–1.2)
Prothrombin Time: 13.3 seconds (ref 11.4–15.2)

## 2019-09-14 SURGERY — ESOPHAGOGASTRODUODENOSCOPY (EGD) WITH PROPOFOL
Anesthesia: General

## 2019-09-14 MED ORDER — FENTANYL CITRATE (PF) 100 MCG/2ML IJ SOLN
INTRAMUSCULAR | Status: AC
  Filled 2019-09-14: qty 2

## 2019-09-14 MED ORDER — LIDOCAINE 2% (20 MG/ML) 5 ML SYRINGE
INTRAMUSCULAR | Status: DC | PRN
  Administered 2019-09-14: 60 mg via INTRAVENOUS

## 2019-09-14 MED ORDER — FENTANYL CITRATE (PF) 100 MCG/2ML IJ SOLN
INTRAMUSCULAR | Status: DC | PRN
  Administered 2019-09-14 (×2): 50 ug via INTRAVENOUS

## 2019-09-14 MED ORDER — GLUCAGON HCL RDNA (DIAGNOSTIC) 1 MG IJ SOLR
INTRAMUSCULAR | Status: AC
  Filled 2019-09-14: qty 1

## 2019-09-14 MED ORDER — INDOMETHACIN 50 MG RE SUPP
RECTAL | Status: DC | PRN
  Administered 2019-09-14: 100 mg via RECTAL

## 2019-09-14 MED ORDER — LACTATED RINGERS IV SOLN: INTRAVENOUS | Status: DC | PRN

## 2019-09-14 MED ORDER — SODIUM CHLORIDE 0.9 % IV SOLN: INTRAVENOUS | Status: DC

## 2019-09-14 MED ORDER — PROPOFOL 10 MG/ML IV BOLUS
INTRAVENOUS | Status: DC | PRN
  Administered 2019-09-14: 90 mg via INTRAVENOUS

## 2019-09-14 MED ORDER — SUGAMMADEX SODIUM 200 MG/2ML IV SOLN
INTRAVENOUS | Status: DC | PRN
  Administered 2019-09-14: 200 mg via INTRAVENOUS

## 2019-09-14 MED ORDER — PHENYLEPHRINE 40 MCG/ML (10ML) SYRINGE FOR IV PUSH (FOR BLOOD PRESSURE SUPPORT)
PREFILLED_SYRINGE | INTRAVENOUS | Status: DC | PRN
  Administered 2019-09-14: 40 ug via INTRAVENOUS

## 2019-09-14 MED ORDER — INDOMETHACIN 50 MG RE SUPP
RECTAL | Status: AC
  Filled 2019-09-14: qty 2

## 2019-09-14 MED ORDER — ONDANSETRON HCL 4 MG/2ML IJ SOLN
INTRAMUSCULAR | Status: DC | PRN
  Administered 2019-09-14: 4 mg via INTRAVENOUS

## 2019-09-14 MED ORDER — PROPOFOL 500 MG/50ML IV EMUL
INTRAVENOUS | Status: AC
  Filled 2019-09-14: qty 50

## 2019-09-14 MED ORDER — EPHEDRINE SULFATE-NACL 50-0.9 MG/10ML-% IV SOSY
PREFILLED_SYRINGE | INTRAVENOUS | Status: DC | PRN
  Administered 2019-09-14: 5 mg via INTRAVENOUS
  Administered 2019-09-14 (×2): 10 mg via INTRAVENOUS
  Administered 2019-09-14: 7.5 mg via INTRAVENOUS
  Administered 2019-09-14: 10 mg via INTRAVENOUS

## 2019-09-14 MED ORDER — ROCURONIUM BROMIDE 10 MG/ML (PF) SYRINGE
PREFILLED_SYRINGE | INTRAVENOUS | Status: DC | PRN
  Administered 2019-09-14: 20 mg via INTRAVENOUS
  Administered 2019-09-14: 50 mg via INTRAVENOUS

## 2019-09-14 MED ORDER — LACTATED RINGERS IV SOLN: INTRAVENOUS | Status: DC

## 2019-09-14 MED ORDER — SODIUM CHLORIDE 0.9 % IV SOLN
INTRAVENOUS | Status: DC | PRN
  Administered 2019-09-14: 25 mL

## 2019-09-14 SURGICAL SUPPLY — 15 items

## 2019-09-14 NOTE — Anesthesia Procedure Notes (Signed)
Procedure Name: Intubation Date/Time: 09/14/2019 10:23 AM Performed by: Claudia Desanctis, CRNA Pre-anesthesia Checklist: Patient identified, Emergency Drugs available, Suction available and Patient being monitored Patient Re-evaluated:Patient Re-evaluated prior to induction Oxygen Delivery Method: Circle system utilized Preoxygenation: Pre-oxygenation with 100% oxygen Induction Type: IV induction Ventilation: Mask ventilation without difficulty Laryngoscope Size: 2 and Miller Grade View: Grade I Tube type: Oral Tube size: 7.5 mm Number of attempts: 1 Airway Equipment and Method: Stylet Placement Confirmation: ETT inserted through vocal cords under direct vision,  positive ETCO2 and breath sounds checked- equal and bilateral Tube secured with: Tape Dental Injury: Teeth and Oropharynx as per pre-operative assessment

## 2019-09-14 NOTE — Telephone Encounter (Signed)
Ronald Bird, His daughter wants referrals to the cancer center in [place] because she lives very near there. Still proceed with [facility] Dr. Barry Dienes referral from a surgical standpoint.  I doubt he'll be offered a surgery, he is almost 84 years old.  Beth, You don't have to put him on the list because looks like he'll be getting his onc cancer elsewhere.   Thanks all

## 2019-09-14 NOTE — Telephone Encounter (Signed)
-----   Message from Milus Banister, MD sent at 09/14/2019 12:49 PM EST ----- Hey, can you send copies of todays EUS and ERCP to [facility] in [place].  Also, he needs referrals to medical oncology and Dr. Barry Dienes at [facility] surgery for newly diagnosed pancreatic adenocarcinoma.  Also, he needs CT scan of chest to complete his staging workup.   Beth,  Can you add him to next week GI tumor board, thanks?

## 2019-09-14 NOTE — Anesthesia Preprocedure Evaluation (Addendum)
Anesthesia Evaluation  Patient identified by MRN, date of birth, ID band Patient awake    Reviewed: Allergy & Precautions, NPO status , Patient's Chart, lab work & pertinent test results  Airway Mallampati: I  TM Distance: >3 FB Neck ROM: Full    Dental  (+) Edentulous Upper, Edentulous Lower   Pulmonary neg pulmonary ROS,    Pulmonary exam normal        Cardiovascular hypertension, + CAD and + Cardiac Stents   Rhythm:Regular Rate:Normal     Neuro/Psych negative neurological ROS  negative psych ROS   GI/Hepatic Neg liver ROS, GERD  ,  Endo/Other  negative endocrine ROS  Renal/GU negative Renal ROS     Musculoskeletal  (+) Arthritis ,   Abdominal Normal abdominal exam  (+)   Peds  Hematology negative hematology ROS (+)   Anesthesia Other Findings   Reproductive/Obstetrics                            Anesthesia Physical Anesthesia Plan  ASA: III  Anesthesia Plan: General   Post-op Pain Management:    Induction: Intravenous  PONV Risk Score and Plan: 1 and Ondansetron  Airway Management Planned: Oral ETT  Additional Equipment: None  Intra-op Plan:   Post-operative Plan: Extubation in OR  Informed Consent: I have reviewed the patients History and Physical, chart, labs and discussed the procedure including the risks, benefits and alternatives for the proposed anesthesia with the patient or authorized representative who has indicated his/her understanding and acceptance.       Plan Discussed with: CRNA  Anesthesia Plan Comments: (Echo: 1. The left ventricle has normal systolic function of 123456. The cavity  size is mildly increased. There is no left ventricular wall thickness.  Echo evidence of impaired relaxation diastolic filling patterns. Normal  left ventricular filling pressures.  2. Normal left atrial size.  3. Normal right atrial size.  4. The mitral valve  normal in structure. There is mild mitral annular  calcification present. Regurgitation is not visualized by color flow  Doppler.  5. Normal tricuspid valve.  6. Tricuspid regurgitation is mild.  7. The aortic valve tricuspid. There is mild thickening and mild  calcification of the aortic valve. Aortic valve regurgitation is mild by  color flow Doppler.  8. No atrial level shunt detected by color flow Doppler. )      Anesthesia Quick Evaluation

## 2019-09-14 NOTE — Op Note (Signed)
[facility] Patient Name: Ronald Bird Procedure Date: 09/14/2019 MRN: [id number] Attending MD: Milus Banister , MD Date of Birth: 02-23-1936 CSN: [id number] Age: 84 Admit Type: Inpatient Procedure:                ERCP Indications:              Malignant stricture of the common bile duct, proven                            to be pancreatic adenocarcinoma by EUS just prior                            to this exam Providers:                Milus Banister, MD, Tory Emerald, RN, Cherylynn Ridges, Technician, Corie Chiquito, Technician, Dellie Catholic Referring MD:              Medicines:                General Anesthesia, Indomethacin 123XX123 mg PR Complications:            No immediate complications. Estimated blood loss:                            None Estimated Blood Loss:     Estimated blood loss: none. Procedure:                Pre-Anesthesia Assessment:                           - Prior to the procedure, a History and Physical                            was performed, and patient medications and                            allergies were reviewed. The patient's tolerance of                            previous anesthesia was also reviewed. The risks                            and benefits of the procedure and the sedation                            options and risks were discussed with the patient.                            All questions were answered, and informed consent                            was  obtained. Prior Anticoagulants: The patient has                            taken Eliquis (apixaban), last dose was 2 days                            prior to procedure. ASA Grade Assessment: III - A                            patient with severe systemic disease. After                            reviewing the risks and benefits, the patient was                            deemed in satisfactory condition to undergo the                       procedure.                           After obtaining informed consent, the scope was                            passed under direct vision. Throughout the                            procedure, the patient's blood pressure, pulse, and                            oxygen saturations were monitored continuously. The                            [id number] Olympus duodenoscope was                            introduced through the mouth, and used to inject                            contrast into and used to inject contrast into the                            bile duct. The ERCP was accomplished without                            difficulty. The patient tolerated the procedure                            well. Scope In: Scope Out: Findings:      Scout film was normal. The duodenoscope was advanced to the region of       the major papilla without detailed examination of the UGI tract. The       major papilla was normal. A 44 Autotome over a .035 hydrawire was used       to cannulate the  bile duct and dye was injected. Cholangiogram revealed       a 1.5cm long mid bile duct stricture. The most distal 1cm of the bile       duct was normal, non-dilated. The CBD/CHD proximal to the stricture was       very dilated (CBD 1.5cm). Intrahepatic ducts did not fill. An 8cm long       uncovered metal biliary stent was attempted to be placed however it was       clearly too long. No other uncovered metal stents were available and so       I placed a 5cm long 8.5Fr plastic biliary stent across the stricture in       good position. There was immediate flow of old, dark bile through the       stent after placement. The main pancreatic duct was never cannulate with       a wire or injected with dye. Impression:               - 1.5cm malignant mid bile duct stricture. This was                            treated with placement of an 8.5Fr 5cm long plastic                             biliary stent (see above). Moderate Sedation:      Not Applicable - Patient had care per Anesthesia. Recommendation:           - Discharge patient to home.                           - [facility] will arrange referrals to medical                            oncology and [facility] for newly diagnosed                            pancreatic adenocarcinoma. Will also complete the                            staging workup with CT of chest. Procedure Code(s):        --- Professional ---                           [phone], Endoscopic retrograde                            cholangiopancreatography (ERCP); with placement of                            endoscopic stent into biliary or pancreatic duct,                            including pre- and post-dilation and guide wire                            passage, when performed, including sphincterotomy,  when performed, each stent Diagnosis Code(s):        --- Professional ---                           K83.1, Obstruction of bile duct CPT copyright 2019 American Medical Association. All rights reserved. The codes documented in this report are preliminary and upon coder review may  be revised to meet current compliance requirements. Milus Banister, MD 09/14/2019 12:47:15 PM This report has been signed electronically. Number of Addenda: 0

## 2019-09-14 NOTE — Telephone Encounter (Signed)
The pt daughter has been advised of the referrals to [facility] as well as the CT chest. The pt has been advised of the information and verbalized understanding.

## 2019-09-14 NOTE — Telephone Encounter (Signed)
Mardene Celeste from [facility] called to inform that Karsten Fells, RN from [employer] inquired about referral sent by GI.  Mardene Celeste stated that nurse Karsten Fells "was very rude and stated that referral was sent without work up notes."  Please call Britney back at [phone].

## 2019-09-14 NOTE — Telephone Encounter (Signed)
The referral has been made and records faxed to [facility] oncology. I spoke with the [facility] and she tells me that she will look for the records and get the pt scheduled.    You are scheduled on 09/20/19  at 4 pm at [facility] radiology. You should arrive 15 minutes prior to your appointment time for registration. Nothing to eat or drink 4 hours prior.  Left message on machine to call back

## 2019-09-14 NOTE — Anesthesia Postprocedure Evaluation (Signed)
Anesthesia Post Note  Patient: Ronald Bird  Procedure(s) Performed: ESOPHAGOGASTRODUODENOSCOPY (EGD) WITH PROPOFOL (N/A ) UPPER ENDOSCOPIC ULTRASOUND (EUS) RADIAL (N/A ) ENDOSCOPIC RETROGRADE CHOLANGIOPANCREATOGRAPHY (ERCP) WITH PROPOFOL (N/A ) FINE NEEDLE ASPIRATION (FNA) LINEAR (N/A ) BILIARY STENT PLACEMENT (N/A )     Patient location during evaluation: PACU Anesthesia Type: General Level of consciousness: awake and alert Pain management: pain level controlled Vital Signs Assessment: post-procedure vital signs reviewed and stable Respiratory status: spontaneous breathing, nonlabored ventilation, respiratory function stable and patient connected to nasal cannula oxygen Cardiovascular status: blood pressure returned to baseline and stable Postop Assessment: no apparent nausea or vomiting Anesthetic complications: no    Last Vitals:  Vitals:   09/14/19 1301 09/14/19 1315  BP: (!) 185/80   Pulse: (!) 57 (!) 58  Resp: 18 17  Temp:    SpO2: 100% 98%    Last Pain:  Vitals:   09/14/19 1315  TempSrc:   PainSc: 0-No pain                 Effie Berkshire

## 2019-09-14 NOTE — Discharge Instructions (Signed)
YOU HAD AN ENDOSCOPIC PROCEDURE TODAY: Refer to the procedure report and other information in the discharge instructions given to you for any specific questions about what was found during the examination. If this information does not answer your questions, please call [place] office at [phone] to clarify.   YOU SHOULD EXPECT: Some feelings of bloating in the abdomen. Passage of more gas than usual. Walking can help get rid of the air that was put into your GI tract during the procedure and reduce the bloating. If you had a lower endoscopy (such as a colonoscopy or flexible sigmoidoscopy) you may notice spotting of blood in your stool or on the toilet paper. Some abdominal soreness may be present for a day or two, also.  DIET: Your first meal following the procedure should be a light meal and then it is ok to progress to your normal diet. A half-sandwich or bowl of soup is an example of a good first meal. Heavy or fried foods are harder to digest and may make you feel nauseous or bloated. Drink plenty of fluids but you should avoid alcoholic beverages for 24 hours. If you had a esophageal dilation, please see attached instructions for diet.    ACTIVITY: Your care partner should take you home directly after the procedure. You should plan to take it easy, moving slowly for the rest of the day. You can resume normal activity the day after the procedure however YOU SHOULD NOT DRIVE, use power tools, machinery or perform tasks that involve climbing or major physical exertion for 24 hours (because of the sedation medicines used during the test).   SYMPTOMS TO REPORT IMMEDIATELY: A gastroenterologist can be reached at any hour. Please call [phone]  for any of the following symptoms:   Following upper endoscopy (EGD, EUS, ERCP, esophageal dilation) Vomiting of blood or coffee ground material  New, significant abdominal pain  New, significant chest pain or pain under the shoulder blades  Painful or  persistently difficult swallowing  New shortness of breath  Black, tarry-looking or red, bloody stools  FOLLOW UP:  If any biopsies were taken you will be contacted by phone or by letter within the next 1-3 weeks. Call [phone]  if you have not heard about the biopsies in 3 weeks.  Please also call with any specific questions about appointments or follow up tests.  

## 2019-09-14 NOTE — Transfer of Care (Signed)
Immediate Anesthesia Transfer of Care Note  Patient: Ronald Bird  Procedure(s) Performed: ESOPHAGOGASTRODUODENOSCOPY (EGD) WITH PROPOFOL (N/A ) UPPER ENDOSCOPIC ULTRASOUND (EUS) RADIAL (N/A ) ENDOSCOPIC RETROGRADE CHOLANGIOPANCREATOGRAPHY (ERCP) WITH PROPOFOL (N/A ) FINE NEEDLE ASPIRATION (FNA) LINEAR (N/A ) BILIARY STENT PLACEMENT (N/A )  Patient Location: Endoscopy Unit  Anesthesia Type:General  Level of Consciousness: awake and patient cooperative  Airway & Oxygen Therapy: Patient Spontanous Breathing and Patient connected to face mask  Post-op Assessment: Report given to RN and Post -op Vital signs reviewed and stable  Post vital signs: Reviewed and stable  Last Vitals:  Vitals Value Taken Time  BP    Temp    Pulse 65 09/14/19 1249  Resp 19 09/14/19 1249  SpO2 100 % 09/14/19 1249  Vitals shown include unvalidated device data.  Last Pain:  Vitals:   09/14/19 0918  PainSc: 0-No pain         Complications: No apparent anesthesia complications

## 2019-09-14 NOTE — Telephone Encounter (Signed)
Oncology referral made, records sent to [facility] with referral.  Called to make appt for CT of the chest but will have to wait until the pt has been discharged to schedule. Will call later this afternoon.

## 2019-09-14 NOTE — Telephone Encounter (Signed)
Left message on machine to call back  

## 2019-09-14 NOTE — H&P (Signed)
HPI: He was referred by [facility] in [place]  08/25/2019 ast 149, alt 207, alk phos 270. tbili 0.9  Labs today ast 258, alt 267, alk phos 362, tbili 1.3, inr 1,    [place] 08/25/2019 "biliary obstruction at the level of the distl CBD.  No gallstones in GB.  CT 08/25/2019: intra and extra hep biliary dilation, tapering of the CBD at the level of the pancreatic head.  "some questionable hypoattenuation of the residual pancreatic parenchyma... without a clearly discernabile mass lesion.   ROS: complete GI ROS as described in HPI, all other review negative.  Constitutional:  No unintentional weight loss   Past Medical History:  Diagnosis Date   BPH (benign prostatic hyperplasia)    Coronary artery disease    DJD (degenerative joint disease)    GERD (gastroesophageal reflux disease)    History of colonic polyps    Hypercholesteremia    Hypertension     Past Surgical History:  Procedure Laterality Date   CATARACT EXTRACTION, BILATERAL  2012   In [place] N/A 09/12/2018   Procedure: CORONARY ATHERECTOMY;  Surgeon: Jettie Booze, MD;  Location: [place] [facility];  Service: Cardiovascular;  Laterality: N/A;  mid LAD   CORONARY BALLOON ANGIOPLASTY N/A 09/12/2018   Procedure: CORONARY BALLOON ANGIOPLASTY;  Surgeon: Jettie Booze, MD;  Location: [place] [facility];  Service: Cardiovascular;  Laterality: N/A;  diag 1   CORONARY STENT INTERVENTION N/A 09/12/2018   Procedure: CORONARY STENT INTERVENTION;  Surgeon: Jettie Booze, MD;  Location: [place] [facility];  Service: Cardiovascular;  Laterality: N/A;  mid LAD   LEFT HEART CATH AND CORONARY ANGIOGRAPHY N/A 09/09/2018   Procedure: LEFT HEART CATH AND CORONARY ANGIOGRAPHY;  Surgeon: Burnell Blanks, MD;  Location: [place] [facility];  Service: Cardiovascular;  Laterality: N/A;   LEFT HEART CATH AND CORONARY ANGIOGRAPHY N/A 09/12/2018   Procedure: LEFT HEART CATH AND CORONARY ANGIOGRAPHY;   Surgeon: Jettie Booze, MD;  Location: [place] [facility];  Service: Cardiovascular;  Laterality: N/A;   MOHS SURGERY  2012   Skin Cancer removed from nose by Moh's in [place]      Current Facility-Administered Medications  Medication Dose Route Frequency Provider Last Rate Last Admin   0.9 %  sodium chloride infusion   Intravenous Continuous Milus Banister, MD       0.9 %  sodium chloride infusion   Intravenous Continuous Milus Banister, MD        Allergies as of 08/29/2019   (No Known Allergies)    Family History  Problem Relation Age of Onset   Heart disease Father    Stroke Father    Hypertension Father    Heart disease Mother    Stroke Mother    Hypertension Mother    Atrial fibrillation Brother    CAD Sister        Patient does not know details    Social History   Socioeconomic History   Marital status: Widowed    Spouse name: [name]   Number of children: 2   Years of education: Not on file   Highest education level: Not on file  Occupational History   Not on file  Tobacco Use   Smoking status: Never Smoker   Smokeless tobacco: Never Used  Substance and Sexual Activity   Alcohol use: No   Drug use: No   Sexual activity: Not on  file  Other Topics Concern   Not on file  Social History Narrative   Not on file   Social Determinants of Health   Financial Resource Strain:    Difficulty of Paying Living Expenses: Not on file  Food Insecurity:    Worried About [place] in the Last Year: Not on file   Ran Out of Food in the Last Year: Not on file  Transportation Needs:    Lack of Transportation (Medical): Not on file   Lack of Transportation (Non-Medical): Not on file  Physical Activity:    Days of Exercise per Week: Not on file   Minutes of Exercise per Session: Not on file  Stress:    Feeling of Stress : Not on file  Social Connections:    Frequency of Communication with Friends and Family: Not on file    Frequency of Social Gatherings with Friends and Family: Not on file   Attends Religious Services: Not on file   Active Member of Clubs or Organizations: Not on file   Attends Archivist Meetings: Not on file   Marital Status: Not on file  Intimate Partner Violence:    Fear of Current or Ex-Partner: Not on file   Emotionally Abused: Not on file   Physically Abused: Not on file   Sexually Abused: Not on file     Physical Exam: BP (!) 184/66   Temp (!) 96.9 F (36.1 C)   Resp 16   Ht '6\' 1"'$  (1.854 m)   Wt 79.4 kg   SpO2 99%   BMI 23.09 kg/m  Constitutional: generally well-appearing Psychiatric: alert and oriented x3 Abdomen: soft, nontender, nondistended, no obvious ascites, no peritoneal signs, normal bowel sounds No peripheral edema noted in lower extremities  Assessment and plan: 84 y.o. male with abnormal pancreas, painless jaundice  For eus, ercp today  Please see the "Patient Instructions" section for addition details about the plan.  Owens Loffler, MD [facility] 09/14/2019, 9:56 AM

## 2019-09-14 NOTE — Op Note (Signed)
[facility] Patient Name: Ronald Bird Procedure Date: 09/14/2019 MRN: [id number] Attending MD: Milus Banister , MD Date of Birth: 1935/10/30 CSN: [id number] Age: 84 Admit Type: Inpatient Procedure:                Upper EUS Indications:              outside [facility]) workup: painless mild jaundice,                            30 pound weight loss, biliary and pancreatic duct                            obstruction/dilation, CT suggesting abnormal                            pancreatic head however "without clearly                            discerniable mass lesion." Providers:                Milus Banister, MD, Elmer Ramp. Tilden Dome, RN, [employer], Technician, [employer], Technician, Dellie Catholic Referring MD:              Medicines:                Monitored Anesthesia Care Complications:            No immediate complications. Estimated blood loss:                            None. Estimated Blood Loss:     Estimated blood loss: none. Procedure:                Pre-Anesthesia Assessment:                           - Prior to the procedure, a History and Physical                            was performed, and patient medications and                            allergies were reviewed. The patient's tolerance of                            previous anesthesia was also reviewed. The risks                            and benefits of the procedure and the sedation                            options and risks were discussed with the patient.  All questions were answered, and informed consent                            was obtained. Prior Anticoagulants: The patient has                            taken Eliquis (apixaban), last dose was 2 days                            prior to procedure. also plavix last dose 5 days                            ago. ASA Grade Assessment: III - A patient with                   severe systemic disease. After reviewing the risks                            and benefits, the patient was deemed in                            satisfactory condition to undergo the procedure.                           After obtaining informed consent, the endoscope was                            passed under direct vision. Throughout the                            procedure, the patient's blood pressure, pulse, and                            oxygen saturations were monitored continuously. The                            [id number] Olympus Radial EUS was                            introduced through the mouth, and advanced to the                            second part of duodenum. The [id number]                            Olympus duodenoscope was introduced through the                            mouth, and advanced to the second part of duodenum.                            The upper EUS was accomplished without difficulty.  The patient tolerated the procedure well. Scope In: Scope Out: Findings:      ENDOSCOPIC FINDING: :      The examined esophagus was endoscopically normal.      The entire examined stomach was endoscopically normal.      The examined duodenum was endoscopically normal including very good       views of the major papilla, intermittently draining dark bile.      ENDOSONOGRAPHIC FINDING: :      1. An irregular mass was identified in the pancreatic head. The mass was       hypoechoic and heterogenous. The mass measured 30 mm by 24 mm in maximal       cross-sectional diameter. The endosonographic borders were very       poorly-defined. The mass is causing pancreatic and bile duct       obstruction, dilation. The mass clearly abuts the PV confluence. The       celiac trunk, SMA were not involved. Fine needle aspiration for cytology       was performed. Color Doppler imaging was utilized prior to needle        puncture to confirm a lack of significant vascular structures within the       needle path. 6 passes were made with the 25 gauge needle using a       transduodenal approach (3 with Expect, then 3 with SharkCore). A       cytologist was present and performed a preliminary cytologic examination.      2. No peripancreatic adenopathy.      3. CBD was dilated into the region of the pancreatic head mass, maximum       diameter 1.5cm.      4. Limited views of the liver, spleen were normal. Impression:               - 3cm by 2.4cm irregularly shaped mass in the head                            of the pancreas causing pancreatic and biliary duct                            obstruction, dilation. The mass directly abuts the                            PV at the level of the confluence suggesting                            invasion.                           - Preliminary cytolgy reading from FNA of the mass                            was positive for malignancy, adenocarcinoma. Moderate Sedation:      Not Applicable - Patient had care per Anesthesia. Recommendation:           - ERCP now for biliary stenting. Procedure Code(s):        --- Professional ---  43238, Esophagogastroduodenoscopy, flexible,                            transoral; with transendoscopic ultrasound-guided                            intramural or transmural fine needle                            aspiration/biopsy(s), (includes endoscopic                            ultrasound examination limited to the esophagus,                            stomach or duodenum, and adjacent structures) Diagnosis Code(s):        --- Professional ---                           K86.89, Other specified diseases of pancreas                           R93.3, Abnormal findings on diagnostic imaging of                            other parts of digestive tract CPT copyright 2019 American Medical Association. All rights reserved. The  codes documented in this report are preliminary and upon coder review may  be revised to meet current compliance requirements. Milus Banister, MD 09/14/2019 12:04:00 PM This report has been signed electronically. Number of Addenda: 0

## 2019-09-15 ENCOUNTER — Telehealth: Payer: Self-pay | Admitting: Gastroenterology

## 2019-09-15 ENCOUNTER — Telehealth: Payer: Self-pay | Admitting: Oncology

## 2019-09-15 ENCOUNTER — Encounter: Payer: Self-pay | Admitting: *Deleted

## 2019-09-15 LAB — CYTOLOGY - NON PAP

## 2019-09-15 NOTE — Telephone Encounter (Signed)
Pt's daughter called regarding a referral to oncology.       Documentation   [place], [place] [phone]  [place] Register

## 2019-09-15 NOTE — Telephone Encounter (Signed)
See alternate phone

## 2019-09-15 NOTE — Telephone Encounter (Signed)
Received a new pt referral from Dr. Ardis Hughs fro pancreatic cancer. I cld and spoke to the pt's daughter and scheduled him to see Dr. Benay Spice on 2/11 at 2pm. She's been made aware to arrive 15 minutes early.

## 2019-09-15 NOTE — Telephone Encounter (Signed)
The pt daughter states that the pt and family decided to stay at [facility] cancer center.  I have called and made the cancer center aware. They will contact the family today.  I have also called [facility] family practice as well as [facility] to make them aware.

## 2019-09-15 NOTE — Telephone Encounter (Signed)
Pt's daughter called regarding a referral to oncology.

## 2019-09-18 ENCOUNTER — Telehealth: Payer: Self-pay | Admitting: Gastroenterology

## 2019-09-18 ENCOUNTER — Emergency Department (HOSPITAL_COMMUNITY): Payer: Medicare Other

## 2019-09-18 ENCOUNTER — Inpatient Hospital Stay (HOSPITAL_COMMUNITY)
Admission: EM | Admit: 2019-09-18 | Discharge: 2019-09-20 | DRG: 872 | Disposition: A | Discharge status: Home / Self Care | Payer: Medicare Other | Attending: Internal Medicine | Admitting: Internal Medicine

## 2019-09-18 ENCOUNTER — Encounter (HOSPITAL_COMMUNITY): Payer: Self-pay

## 2019-09-18 ENCOUNTER — Other Ambulatory Visit: Payer: Self-pay

## 2019-09-18 DIAGNOSIS — N401 Enlarged prostate with lower urinary tract symptoms: Secondary | ICD-10-CM | POA: Diagnosis present

## 2019-09-18 DIAGNOSIS — C259 Malignant neoplasm of pancreas, unspecified: Secondary | ICD-10-CM | POA: Diagnosis not present

## 2019-09-18 DIAGNOSIS — M1990 Unspecified osteoarthritis, unspecified site: Secondary | ICD-10-CM | POA: Diagnosis present

## 2019-09-18 DIAGNOSIS — I10 Essential (primary) hypertension: Secondary | ICD-10-CM | POA: Diagnosis not present

## 2019-09-18 DIAGNOSIS — R6881 Early satiety: Secondary | ICD-10-CM | POA: Diagnosis present

## 2019-09-18 DIAGNOSIS — A419 Sepsis, unspecified organism: Principal | ICD-10-CM | POA: Diagnosis present

## 2019-09-18 DIAGNOSIS — Z8249 Family history of ischemic heart disease and other diseases of the circulatory system: Secondary | ICD-10-CM

## 2019-09-18 DIAGNOSIS — I2510 Atherosclerotic heart disease of native coronary artery without angina pectoris: Secondary | ICD-10-CM | POA: Diagnosis not present

## 2019-09-18 DIAGNOSIS — Z7902 Long term (current) use of antithrombotics/antiplatelets: Secondary | ICD-10-CM | POA: Diagnosis not present

## 2019-09-18 DIAGNOSIS — E039 Hypothyroidism, unspecified: Secondary | ICD-10-CM | POA: Diagnosis present

## 2019-09-18 DIAGNOSIS — K5900 Constipation, unspecified: Secondary | ICD-10-CM | POA: Diagnosis present

## 2019-09-18 DIAGNOSIS — Z7901 Long term (current) use of anticoagulants: Secondary | ICD-10-CM | POA: Diagnosis not present

## 2019-09-18 DIAGNOSIS — R509 Fever, unspecified: Secondary | ICD-10-CM

## 2019-09-18 DIAGNOSIS — E7800 Pure hypercholesterolemia, unspecified: Secondary | ICD-10-CM | POA: Diagnosis not present

## 2019-09-18 DIAGNOSIS — Z79890 Hormone replacement therapy (postmenopausal): Secondary | ICD-10-CM | POA: Diagnosis not present

## 2019-09-18 DIAGNOSIS — Z85828 Personal history of other malignant neoplasm of skin: Secondary | ICD-10-CM

## 2019-09-18 DIAGNOSIS — Z20822 Contact with and (suspected) exposure to covid-19: Secondary | ICD-10-CM | POA: Diagnosis not present

## 2019-09-18 DIAGNOSIS — I480 Paroxysmal atrial fibrillation: Secondary | ICD-10-CM | POA: Diagnosis not present

## 2019-09-18 DIAGNOSIS — K8309 Other cholangitis: Secondary | ICD-10-CM | POA: Diagnosis not present

## 2019-09-18 DIAGNOSIS — Z7983 Long term (current) use of bisphosphonates: Secondary | ICD-10-CM

## 2019-09-18 DIAGNOSIS — E876 Hypokalemia: Secondary | ICD-10-CM | POA: Diagnosis present

## 2019-09-18 DIAGNOSIS — K219 Gastro-esophageal reflux disease without esophagitis: Secondary | ICD-10-CM | POA: Diagnosis present

## 2019-09-18 DIAGNOSIS — R7989 Other specified abnormal findings of blood chemistry: Secondary | ICD-10-CM | POA: Diagnosis not present

## 2019-09-18 DIAGNOSIS — Z79899 Other long term (current) drug therapy: Secondary | ICD-10-CM

## 2019-09-18 DIAGNOSIS — D649 Anemia, unspecified: Secondary | ICD-10-CM | POA: Diagnosis present

## 2019-09-18 DIAGNOSIS — R945 Abnormal results of liver function studies: Secondary | ICD-10-CM | POA: Diagnosis not present

## 2019-09-18 DIAGNOSIS — E785 Hyperlipidemia, unspecified: Secondary | ICD-10-CM | POA: Diagnosis present

## 2019-09-18 DIAGNOSIS — R351 Nocturia: Secondary | ICD-10-CM | POA: Diagnosis present

## 2019-09-18 DIAGNOSIS — I712 Thoracic aortic aneurysm, without rupture: Secondary | ICD-10-CM | POA: Diagnosis present

## 2019-09-18 DIAGNOSIS — R531 Weakness: Secondary | ICD-10-CM | POA: Diagnosis not present

## 2019-09-18 DIAGNOSIS — Z955 Presence of coronary angioplasty implant and graft: Secondary | ICD-10-CM

## 2019-09-18 HISTORY — DX: Malignant (primary) neoplasm, unspecified: C80.1

## 2019-09-18 HISTORY — DX: Malignant neoplasm of pancreas, unspecified: C25.9

## 2019-09-18 LAB — COMPREHENSIVE METABOLIC PANEL
ALT: 329 U/L — ABNORMAL HIGH (ref 0–44)
AST: 338 U/L — ABNORMAL HIGH (ref 15–41)
Albumin: 3.8 g/dL (ref 3.5–5.0)
Alkaline Phosphatase: 436 U/L — ABNORMAL HIGH (ref 38–126)
Anion gap: 9 (ref 5–15)
BUN: 20 mg/dL (ref 8–23)
CO2: 23 mmol/L (ref 22–32)
Calcium: 8.8 mg/dL — ABNORMAL LOW (ref 8.9–10.3)
Chloride: 99 mmol/L (ref 98–111)
Creatinine, Ser: 1 mg/dL (ref 0.61–1.24)
GFR calc Af Amer: >60 mL/min (ref >60)
GFR calc non Af Amer: >60 mL/min (ref >60)
Glucose, Bld: 114 mg/dL — ABNORMAL HIGH (ref 70–99)
Potassium: 3.1 mmol/L — ABNORMAL LOW (ref 3.5–5.1)
Sodium: 131 mmol/L — ABNORMAL LOW (ref 135–145)
Total Bilirubin: 1.8 mg/dL — ABNORMAL HIGH (ref 0.3–1.2)
Total Protein: 7 g/dL (ref 6.5–8.1)

## 2019-09-18 LAB — URINALYSIS, ROUTINE W REFLEX MICROSCOPIC
Bilirubin Urine: NEGATIVE
Glucose, UA: NEGATIVE mg/dL
Hgb urine dipstick: NEGATIVE
Ketones, ur: NEGATIVE mg/dL
Leukocytes,Ua: NEGATIVE
Nitrite: NEGATIVE
Protein, ur: NEGATIVE mg/dL
Specific Gravity, Urine: 1.019 (ref 1.005–1.030)
pH: 6 (ref 5.0–8.0)

## 2019-09-18 LAB — POC SARS CORONAVIRUS 2 AG -  ED: SARS Coronavirus 2 Ag: NEGATIVE

## 2019-09-18 LAB — CBC WITH DIFFERENTIAL/PLATELET
Abs Immature Granulocytes: 0.06 10*3/uL (ref 0.00–0.07)
Basophils Absolute: 0 10*3/uL (ref 0.0–0.1)
Basophils Relative: 0 %
Eosinophils Absolute: 0 10*3/uL (ref 0.0–0.5)
Eosinophils Relative: 0 %
HCT: 36.8 % — ABNORMAL LOW (ref 39.0–52.0)
Hemoglobin: 12.6 g/dL — ABNORMAL LOW (ref 13.0–17.0)
Immature Granulocytes: 1 %
Lymphocytes Relative: 3 %
Lymphs Abs: 0.3 10*3/uL — ABNORMAL LOW (ref 0.7–4.0)
MCH: 32.4 pg (ref 26.0–34.0)
MCHC: 34.2 g/dL (ref 30.0–36.0)
MCV: 94.6 fL (ref 80.0–100.0)
Monocytes Absolute: 0.7 10*3/uL (ref 0.1–1.0)
Monocytes Relative: 6 %
Neutro Abs: 10.6 10*3/uL — ABNORMAL HIGH (ref 1.7–7.7)
Neutrophils Relative %: 90 %
Platelets: 131 10*3/uL — ABNORMAL LOW (ref 150–400)
RBC: 3.89 MIL/uL — ABNORMAL LOW (ref 4.22–5.81)
RDW: 13.3 % (ref 11.5–15.5)
WBC: 11.6 10*3/uL — ABNORMAL HIGH (ref 4.0–10.5)
nRBC: 0 % (ref 0.0–0.2)

## 2019-09-18 LAB — CBG MONITORING, ED: Glucose-Capillary: 107 mg/dL — ABNORMAL HIGH (ref 70–99)

## 2019-09-18 LAB — RESPIRATORY PANEL BY RT PCR (FLU A&B, COVID)
Influenza A by PCR: NEGATIVE
Influenza B by PCR: NEGATIVE
SARS Coronavirus 2 by RT PCR: NEGATIVE

## 2019-09-18 LAB — MAGNESIUM: Magnesium: 1.6 mg/dL — ABNORMAL LOW (ref 1.7–2.4)

## 2019-09-18 LAB — PROTIME-INR
INR: 1.4 — ABNORMAL HIGH (ref 0.8–1.2)
Prothrombin Time: 16.7 seconds — ABNORMAL HIGH (ref 11.4–15.2)

## 2019-09-18 LAB — APTT: aPTT: 36 seconds (ref 24–36)

## 2019-09-18 LAB — LACTIC ACID, PLASMA: Lactic Acid, Venous: 1.2 mmol/L (ref 0.5–1.9)

## 2019-09-18 LAB — PHOSPHORUS: Phosphorus: 3.8 mg/dL (ref 2.5–4.6)

## 2019-09-18 LAB — LIPASE, BLOOD: Lipase: 13 U/L (ref 11–51)

## 2019-09-18 MED ORDER — POTASSIUM CHLORIDE IN NACL 40-0.9 MEQ/L-% IV SOLN
INTRAVENOUS | Status: AC
  Administered 2019-09-18 – 2019-09-19 (×2): 88 mL/h via INTRAVENOUS
  Filled 2019-09-18 (×2): qty 1000

## 2019-09-18 MED ORDER — IBUPROFEN 200 MG PO TABS
400.0000 mg | ORAL_TABLET | Freq: Once | ORAL | Status: AC
  Administered 2019-09-18: 13:00:00 400 mg via ORAL
  Filled 2019-09-18: qty 2

## 2019-09-18 MED ORDER — PANTOPRAZOLE SODIUM 40 MG IV SOLR
40.0000 mg | INTRAVENOUS | Status: DC
  Administered 2019-09-18 – 2019-09-19 (×2): 40 mg via INTRAVENOUS
  Filled 2019-09-18 (×2): qty 40

## 2019-09-18 MED ORDER — VANCOMYCIN HCL 1500 MG/300ML IV SOLN
1500.0000 mg | INTRAVENOUS | Status: DC
  Administered 2019-09-19 – 2019-09-20 (×2): 1500 mg via INTRAVENOUS
  Filled 2019-09-18 (×2): qty 300

## 2019-09-18 MED ORDER — LEVOTHYROXINE SODIUM 25 MCG PO TABS
25.0000 ug | ORAL_TABLET | Freq: Every day | ORAL | Status: DC
  Administered 2019-09-19 – 2019-09-20 (×2): 25 ug via ORAL
  Filled 2019-09-18 (×2): qty 1

## 2019-09-18 MED ORDER — IOHEXOL 300 MG/ML  SOLN
100.0000 mL | Freq: Once | INTRAMUSCULAR | Status: AC | PRN
  Administered 2019-09-18: 13:00:00 100 mL via INTRAVENOUS

## 2019-09-18 MED ORDER — PANTOPRAZOLE SODIUM 40 MG PO TBEC: 40.0000 mg | DELAYED_RELEASE_TABLET | Freq: Every day | ORAL | Status: DC

## 2019-09-18 MED ORDER — LORATADINE 10 MG PO TABS
10.0000 mg | ORAL_TABLET | Freq: Every day | ORAL | Status: DC
  Administered 2019-09-19: 10 mg via ORAL
  Filled 2019-09-18 (×2): qty 1

## 2019-09-18 MED ORDER — METOPROLOL TARTRATE 25 MG PO TABS
25.0000 mg | ORAL_TABLET | Freq: Two times a day (BID) | ORAL | Status: DC
  Administered 2019-09-18 – 2019-09-20 (×4): 25 mg via ORAL
  Filled 2019-09-18 (×4): qty 1

## 2019-09-18 MED ORDER — MAGNESIUM OXIDE 400 MG PO TABS
400.0000 mg | ORAL_TABLET | Freq: Every day | ORAL | Status: DC
  Filled 2019-09-18: qty 1

## 2019-09-18 MED ORDER — SODIUM CHLORIDE 0.9 % IV SOLN
2.0000 g | Freq: Three times a day (TID) | INTRAVENOUS | Status: DC
  Administered 2019-09-19: 2 g via INTRAVENOUS
  Filled 2019-09-18 (×3): qty 2

## 2019-09-18 MED ORDER — METRONIDAZOLE IN NACL 5-0.79 MG/ML-% IV SOLN
500.0000 mg | Freq: Three times a day (TID) | INTRAVENOUS | Status: DC
  Administered 2019-09-18 – 2019-09-19 (×3): 500 mg via INTRAVENOUS
  Filled 2019-09-18 (×3): qty 100

## 2019-09-18 MED ORDER — LISINOPRIL 20 MG PO TABS
20.0000 mg | ORAL_TABLET | Freq: Two times a day (BID) | ORAL | Status: DC
  Administered 2019-09-18: 22:00:00 20 mg via ORAL
  Filled 2019-09-18: qty 1

## 2019-09-18 MED ORDER — MAGNESIUM SULFATE 2 GM/50ML IV SOLN
2.0000 g | Freq: Once | INTRAVENOUS | Status: AC
  Administered 2019-09-18: 18:00:00 2 g via INTRAVENOUS
  Filled 2019-09-18: qty 50

## 2019-09-18 MED ORDER — SODIUM CHLORIDE 0.9 % IV BOLUS
500.0000 mL | Freq: Once | INTRAVENOUS | Status: AC
  Administered 2019-09-18: 12:00:00 500 mL via INTRAVENOUS

## 2019-09-18 MED ORDER — VANCOMYCIN HCL IN DEXTROSE 1-5 GM/200ML-% IV SOLN
1000.0000 mg | Freq: Once | INTRAVENOUS | Status: AC
  Administered 2019-09-18: 1000 mg via INTRAVENOUS
  Filled 2019-09-18: qty 200

## 2019-09-18 MED ORDER — ONDANSETRON HCL 4 MG PO TABS: 4.0000 mg | ORAL_TABLET | Freq: Four times a day (QID) | ORAL | Status: DC | PRN

## 2019-09-18 MED ORDER — ONDANSETRON HCL 4 MG/2ML IJ SOLN
4.0000 mg | Freq: Once | INTRAMUSCULAR | Status: AC
  Administered 2019-09-18: 4 mg via INTRAVENOUS
  Filled 2019-09-18: qty 2

## 2019-09-18 MED ORDER — CETIRIZINE HCL 10 MG PO TABS
5.0000 mg | ORAL_TABLET | Freq: Every day | ORAL | Status: DC
  Filled 2019-09-18: qty 1

## 2019-09-18 MED ORDER — SODIUM CHLORIDE 0.9 % IV BOLUS
500.0000 mL | Freq: Once | INTRAVENOUS | Status: AC
  Administered 2019-09-18: 13:00:00 500 mL via INTRAVENOUS

## 2019-09-18 MED ORDER — ONDANSETRON HCL 4 MG/2ML IJ SOLN: 4.0000 mg | Freq: Four times a day (QID) | INTRAMUSCULAR | Status: DC | PRN

## 2019-09-18 MED ORDER — SODIUM CHLORIDE 0.9 % IV SOLN
2.0000 g | Freq: Once | INTRAVENOUS | Status: AC
  Administered 2019-09-18: 13:00:00 2 g via INTRAVENOUS
  Filled 2019-09-18: qty 2

## 2019-09-18 MED ORDER — SODIUM CHLORIDE 0.9 % IV BOLUS
1000.0000 mL | Freq: Once | INTRAVENOUS | Status: AC
  Administered 2019-09-18: 1000 mL via INTRAVENOUS

## 2019-09-18 MED ORDER — METRONIDAZOLE IN NACL 5-0.79 MG/ML-% IV SOLN
500.0000 mg | Freq: Once | INTRAVENOUS | Status: AC
  Administered 2019-09-18: 13:00:00 500 mg via INTRAVENOUS
  Filled 2019-09-18: qty 100

## 2019-09-18 MED ORDER — SODIUM CHLORIDE (PF) 0.9 % IJ SOLN
INTRAMUSCULAR | Status: AC
  Filled 2019-09-18: qty 50

## 2019-09-18 MED ORDER — SODIUM CHLORIDE 0.9% FLUSH
3.0000 mL | Freq: Once | INTRAVENOUS | Status: AC
  Administered 2019-09-18: 12:00:00 3 mL via INTRAVENOUS

## 2019-09-18 MED ORDER — NITROGLYCERIN 0.4 MG SL SUBL: 0.4000 mg | SUBLINGUAL_TABLET | SUBLINGUAL | Status: DC | PRN

## 2019-09-18 MED ORDER — TAMSULOSIN HCL 0.4 MG PO CAPS
0.4000 mg | ORAL_CAPSULE | Freq: Every day | ORAL | Status: DC
  Administered 2019-09-18 – 2019-09-19 (×2): 0.4 mg via ORAL
  Filled 2019-09-18 (×2): qty 1

## 2019-09-18 NOTE — Telephone Encounter (Signed)
I spoke with the pt daughter and she tells me that she is at the pt's home and he is now nauseous and "shaking"  She is going to sit down with the pt and try to convince him to go to the ED. She will call back and update if she is able to get him to go.

## 2019-09-18 NOTE — Consult Note (Addendum)
Consultation  Referring Provider:   Dr. Stark Jock   Primary Care Physician:  Street, Sharon Mt, MD Primary Gastroenterologist:  Dr. Ardis Hughs       Reason for Consultation:  Fever and Chills, s/p biliary stent placement 09/14/19            HPI:   Ronald Bird is a 84 y.o. male with a past medical history as listed below including CAD, hypertension, paroxysmal A. fib on Plavix and Eliquis and recently diagnosed pancreatic cancer (adenocarcinoma) status post ERCP/EUS with biliary stent by Dr. Ardis Hughs 09/14/2019, who presented to the ER today with a complaint of fever over the past couple of days and feeling generally poorly with weakness and poor p.o. intake for 2 to 3 days.    Today, the patient describes that he has not felt well over the past 2 to 3 days with fever and chills and poor p.o. intake with a max temp at home around 100, taking Tylenol with some improvement.  Does have a mild amount of epigastric discomfort, unchanged since time of the ERCP.  Tells me that he has a lot of appointments this week including with the surgeon and oncologist and possibly to get a port placed and wants to move past this so that he can work on the next step.  Tells me he did take his Plavix and Eliquis this morning but has not eaten anything other than some crackers with his meds.    Denies change in bowel habits or vomiting.  ER course: Persistent dilation of the biliary tree after stent placement, patient's bilirubin has risen, the possibility of stent obstruction should be considered, slight no enhancement of the wall the distended gallbladder with a new tiny amount of fluid around the distended gallbladder, could represent early infection, slight enhancement of the pancreatic head near the ampulla consistent with tumor, no evidence of inflammation around the pancreas, no other acute abnormalities, extensive aortic atherosclerosis, large median lobe of the prostate gland; labs with a sodium low at 131, potassium  low at 3.1, alk phos elevated 436 (362 on 2/4), AST 338 (258 on 2/4), ALT 329 (267 on 2/4), total bilirubin 1.8 (1.3 on 2/4), white count 11.6 (5.5 on 2/4), hemoglobin 12.6, platelets 131  GI history: 09/14/2019 EUS/ERCP with Dr. Ardis Hughs: EUS with 3 cm x 2.4 cm irregular shaped mass in the head of the pancreas causing pancreatic and biliary duct obstruction, dilation, mass directly abuts the PV at the level of the confluence suggesting invasion, preliminary cytology from FNA of the mass was positive for malignancy, adenocarcinoma; ERCP with 1.5 cm malignant mid bile duct stricture, treated with placement of an 8.5 French 5 cm long plastic biliary stent  Past Medical History:  Diagnosis Date  . BPH (benign prostatic hyperplasia)   . Cancer ([place])   . Coronary artery disease   . DJD (degenerative joint disease)   . GERD (gastroesophageal reflux disease)   . History of colonic polyps   . Hypercholesteremia   . Hypertension     Past Surgical History:  Procedure Laterality Date  . BILIARY STENT PLACEMENT N/A 09/14/2019   Procedure: BILIARY STENT PLACEMENT;  Surgeon: Milus Banister, MD;  Location: [facility];  Service: Endoscopy;  Laterality: N/A;  . CATARACT EXTRACTION, BILATERAL  2012   In [facility]  . CORONARY ATHERECTOMY N/A 09/12/2018   Procedure: CORONARY ATHERECTOMY;  Surgeon: Jettie Booze, MD;  Location: [place] [facility];  Service: Cardiovascular;  Laterality: N/A;  mid LAD  . CORONARY BALLOON ANGIOPLASTY N/A 09/12/2018   Procedure: CORONARY BALLOON ANGIOPLASTY;  Surgeon: Jettie Booze, MD;  Location: [place] [facility];  Service: Cardiovascular;  Laterality: N/A;  diag 1  . CORONARY STENT INTERVENTION N/A 09/12/2018   Procedure: CORONARY STENT INTERVENTION;  Surgeon: Jettie Booze, MD;  Location: [place] [facility];  Service: Cardiovascular;  Laterality: N/A;  mid LAD  . ENDOSCOPIC RETROGRADE CHOLANGIOPANCREATOGRAPHY (ERCP) WITH PROPOFOL N/A 09/14/2019   Procedure:  ENDOSCOPIC RETROGRADE CHOLANGIOPANCREATOGRAPHY (ERCP) WITH PROPOFOL;  Surgeon: Milus Banister, MD;  Location: [facility];  Service: Endoscopy;  Laterality: N/A;  . ESOPHAGOGASTRODUODENOSCOPY (EGD) WITH PROPOFOL N/A 09/14/2019   Procedure: ESOPHAGOGASTRODUODENOSCOPY (EGD) WITH PROPOFOL;  Surgeon: Milus Banister, MD;  Location: [facility];  Service: Endoscopy;  Laterality: N/A;  . EUS N/A 09/14/2019   Procedure: UPPER ENDOSCOPIC ULTRASOUND (EUS) RADIAL;  Surgeon: Milus Banister, MD;  Location: [facility];  Service: Endoscopy;  Laterality: N/A;  CAT 2  . FINE NEEDLE ASPIRATION N/A 09/14/2019   Procedure: FINE NEEDLE ASPIRATION (FNA) LINEAR;  Surgeon: Milus Banister, MD;  Location: [facility];  Service: Endoscopy;  Laterality: N/A;  . LEFT HEART CATH AND CORONARY ANGIOGRAPHY N/A 09/09/2018   Procedure: LEFT HEART CATH AND CORONARY ANGIOGRAPHY;  Surgeon: Burnell Blanks, MD;  Location: [place] [facility];  Service: Cardiovascular;  Laterality: N/A;  . LEFT HEART CATH AND CORONARY ANGIOGRAPHY N/A 09/12/2018   Procedure: LEFT HEART CATH AND CORONARY ANGIOGRAPHY;  Surgeon: Jettie Booze, MD;  Location: [place] [facility];  Service: Cardiovascular;  Laterality: N/A;  . MOHS SURGERY  2012   Skin Cancer removed from nose by Moh's in [place]  . SHOULDER SURGERY      Family History  Problem Relation Age of Onset  . Heart disease Father   . Stroke Father   . Hypertension Father   . Heart disease Mother   . Stroke Mother   . Hypertension Mother   . Atrial fibrillation Brother   . CAD Sister        Patient does not know details     Social History   Tobacco Use  . Smoking status: Never Smoker  . Smokeless tobacco: Never Used  Substance Use Topics  . Alcohol use: No  . Drug use: No    Prior to Admission medications   Medication Sig Start Date End Date Taking? Authorizing Provider  apixaban (ELIQUIS) 5 MG TABS tablet Take 1 tablet (5 mg total) by mouth 2 (two) times  daily. 12/08/18  Yes Park Liter, MD  calcium carbonate (TUMS - DOSED IN MG ELEMENTAL CALCIUM) 500 MG chewable tablet Chew 1 tablet by mouth as needed for indigestion or heartburn.   Yes [provider]  Cinnamon 500 MG TABS Take 1,000 tablets by mouth daily at 12 noon.    Yes [provider]  clopidogrel (PLAVIX) 75 MG tablet Take 75 mg by mouth daily. 08/30/19  Yes [provider]  Cyanocobalamin (B-12) 2500 MCG SUBL Place 2,500 mg under the tongue daily at 12 noon.    Yes [provider]  docusate sodium (COLACE) 100 MG capsule Take 100 mg by mouth daily.   Yes [provider]  glucosamine-chondroitin 500-400 MG tablet Take 1 tablet by mouth daily.     Yes [provider]  hydrochlorothiazide (MICROZIDE) 12.5 MG capsule TAKE 1 CAPSULE BY MOUTH ONCE DAILY. Patient taking differently: Take 12.5 mg by mouth daily.  05/29/19  Yes  Park Liter, MD  levocetirizine (XYZAL) 5 MG tablet Take 5 mg by mouth at bedtime.    Yes [provider]  levothyroxine (SYNTHROID, LEVOTHROID) 25 MCG tablet Take 25 mcg by mouth daily before breakfast.   Yes [provider]  lisinopril (ZESTRIL) 40 MG tablet Take 1 tablet (40 mg total) by mouth daily. Patient taking differently: Take 20 mg by mouth 2 (two) times daily.  12/08/18  Yes Park Liter, MD  Lycopene 10 MG CAPS Take 10 mg by mouth daily.    Yes [provider]  magnesium oxide (MAG-OX) 400 MG tablet Take 400 mg by mouth daily at 12 noon.    Yes [provider]  Methylcellulose, Laxative, (CITRUCEL PO) Take 1 tablet by mouth daily at 12 noon.    Yes [provider]  metoprolol tartrate (LOPRESSOR) 25 MG tablet Take 1 tablet (25 mg total) by mouth 2 (two) times daily. 12/08/18  Yes Park Liter, MD  Multiple Vitamin (MULTIVITAMIN) capsule Take 1 capsule by mouth daily at 12 noon.    Yes [provider]  nitroGLYCERIN (NITROSTAT) 0.4  MG SL tablet Place 1 tablet (0.4 mg total) under the tongue every 5 (five) minutes as needed for chest pain. 09/13/18  Yes Daune Perch, NP  omega-3 acid ethyl esters (LOVAZA) 1 g capsule Take 1,400 mg by mouth 2 (two) times daily.   Yes [provider]  pantoprazole (PROTONIX) 40 MG tablet Take 1 tablet (40 mg total) by mouth daily. 30 mins before a meal  07/01/11  Yes Noralee Space, MD  Probiotic Product (PROBIOTIC DAILY PO) Take 1 tablet by mouth daily at 12 noon.   Yes [provider]  rosuvastatin (CRESTOR) 10 MG tablet Take 1 tablet (10 mg total) by mouth daily at 6 PM. Patient taking differently: Take 20 mg by mouth every evening.  12/08/18 12/03/19 Yes Park Liter, MD  Saw Palmetto, Serenoa repens, 1000 MG CAPS Take 2,000 mg by mouth 2 (two) times daily.    Yes [provider]  tamsulosin (FLOMAX) 0.4 MG CAPS capsule Take 0.4 mg by mouth at bedtime. 07/28/19  Yes [provider]  Vitamin D, Ergocalciferol, (DRISDOL) 50000 units CAPS capsule Take 50,000 Units by mouth every Tuesday.    Yes [provider]  ezetimibe (ZETIA) 10 MG tablet Take 1 tablet (10 mg total) by mouth daily. Patient not taking: Reported on 09/04/2019 12/08/18   Park Liter, MD  fluticasone [facility]) 50 MCG/ACT nasal spray Place 2 sprays into both nostrils daily for 30 days. Patient not taking: Reported on 09/04/2019 09/14/18 12/08/19  Daune Perch, NP  loratadine (CLARITIN) 10 MG tablet Take 1 tablet (10 mg total) by mouth daily for 30 days. Patient not taking: Reported on 09/04/2019 09/13/18 12/08/19  Daune Perch, NP    Current Facility-Administered Medications  Medication Dose Route Frequency Provider Last Rate Last Admin  . sodium chloride (PF) 0.9 % injection            Current Outpatient Medications  Medication Sig Dispense Refill  . apixaban (ELIQUIS) 5 MG TABS tablet Take 1 tablet (5 mg total) by mouth 2 (two) times daily. 180 tablet 2  . calcium  carbonate (TUMS - DOSED IN MG ELEMENTAL CALCIUM) 500 MG chewable tablet Chew 1 tablet by mouth as needed for indigestion or heartburn.    . Cinnamon 500 MG TABS Take 1,000 tablets by mouth daily at 12 noon.     . clopidogrel (  PLAVIX) 75 MG tablet Take 75 mg by mouth daily.    . Cyanocobalamin (B-12) 2500 MCG SUBL Place 2,500 mg under the tongue daily at 12 noon.     . docusate sodium (COLACE) 100 MG capsule Take 100 mg by mouth daily.    Marland Kitchen glucosamine-chondroitin 500-400 MG tablet Take 1 tablet by mouth daily.      . hydrochlorothiazide (MICROZIDE) 12.5 MG capsule TAKE 1 CAPSULE BY MOUTH ONCE DAILY. (Patient taking differently: Take 12.5 mg by mouth daily. ) 90 capsule 0  . levocetirizine (XYZAL) 5 MG tablet Take 5 mg by mouth at bedtime.     Marland Kitchen levothyroxine (SYNTHROID, LEVOTHROID) 25 MCG tablet Take 25 mcg by mouth daily before breakfast.    . lisinopril (ZESTRIL) 40 MG tablet Take 1 tablet (40 mg total) by mouth daily. (Patient taking differently: Take 20 mg by mouth 2 (two) times daily. ) 90 tablet 2  . Lycopene 10 MG CAPS Take 10 mg by mouth daily.     . magnesium oxide (MAG-OX) 400 MG tablet Take 400 mg by mouth daily at 12 noon.     . Methylcellulose, Laxative, (CITRUCEL PO) Take 1 tablet by mouth daily at 12 noon.     . metoprolol tartrate (LOPRESSOR) 25 MG tablet Take 1 tablet (25 mg total) by mouth 2 (two) times daily. 180 tablet 1  . Multiple Vitamin (MULTIVITAMIN) capsule Take 1 capsule by mouth daily at 12 noon.     . nitroGLYCERIN (NITROSTAT) 0.4 MG SL tablet Place 1 tablet (0.4 mg total) under the tongue every 5 (five) minutes as needed for chest pain. 25 tablet 12  . omega-3 acid ethyl esters (LOVAZA) 1 g capsule Take 1,400 mg by mouth 2 (two) times daily.    . pantoprazole (PROTONIX) 40 MG tablet Take 1 tablet (40 mg total) by mouth daily. 30 mins before a meal  90 tablet 3  . Probiotic Product (PROBIOTIC DAILY PO) Take 1 tablet by mouth daily at 12 noon.    . rosuvastatin  (CRESTOR) 10 MG tablet Take 1 tablet (10 mg total) by mouth daily at 6 PM. (Patient taking differently: Take 20 mg by mouth every evening. ) 90 tablet 3  . Saw Palmetto, Serenoa repens, 1000 MG CAPS Take 2,000 mg by mouth 2 (two) times daily.     . tamsulosin (FLOMAX) 0.4 MG CAPS capsule Take 0.4 mg by mouth at bedtime.    . Vitamin D, Ergocalciferol, (DRISDOL) 50000 units CAPS capsule Take 50,000 Units by mouth every Tuesday.     . ezetimibe (ZETIA) 10 MG tablet Take 1 tablet (10 mg total) by mouth daily. (Patient not taking: Reported on 09/04/2019) 90 tablet 2  . fluticasone (FLONASE) 50 MCG/ACT nasal spray Place 2 sprays into both nostrils daily for 30 days. (Patient not taking: Reported on 09/04/2019) 1 g 0  . loratadine (CLARITIN) 10 MG tablet Take 1 tablet (10 mg total) by mouth daily for 30 days. (Patient not taking: Reported on 09/04/2019) 30 tablet 0    Allergies as of 09/18/2019  . (No Known Allergies)     Review of Systems:    Constitutional: No weight loss, fever or chills Skin: No rash  Cardiovascular: No chest pain Respiratory: No SOB  Gastrointestinal: See HPI and otherwise negative Genitourinary: No dysuria  Neurological: No headache Musculoskeletal: No new muscle or joint pain Hematologic: No bleeding  Psychiatric: No history of depression or anxiety    Physical Exam:  Vital signs in last 24  hours: Temp:  [98.2 F (36.8 C)-101.7 F (38.7 C)] 101.7 F (38.7 C) (02/08 1150) Pulse Rate:  [63-83] 66 (02/08 1441) Resp:  [16-21] 18 (02/08 1441) BP: (102-122)/(53-68) 122/56 (02/08 1441) SpO2:  [97 %-100 %] 99 % (02/08 1441) Weight:  [77.1 kg] 77.1 kg (02/08 1115)   General:   Pleasant Elderly Caucasian male appears to be in NAD, Well developed, Well nourished, alert and cooperative Head:  Normocephalic and atraumatic. Eyes:   PEERL, EOMI. No icterus. Conjunctiva pink. Ears:  Normal auditory acuity. Neck:  Supple Throat: Oral cavity and pharynx without inflammation,  swelling or lesion.  Lungs: Respirations even and unlabored. Lungs clear to auscultation bilaterally.   No wheezes, crackles, or rhonchi.  Heart: Normal S1, S2. No MRG. Regular rate and rhythm. No peripheral edema, cyanosis or pallor.  Abdomen:  Soft, nondistended, Mild epigastric ttp, No rebound or guarding. Normal bowel sounds. No appreciable masses or hepatomegaly. Rectal:  Not performed.  Msk:  Symmetrical without gross deformities. Peripheral pulses intact.  Extremities:  Without edema, no deformity or joint abnormality. Neurologic:  Alert and  oriented x4;  grossly normal neurologically.  Skin:   Dry and intact without significant lesions or rashes. Psychiatric: Demonstrates good judgement and reason without abnormal affect or behaviors.   LAB RESULTS: Recent Labs    09/18/19 1201  WBC 11.6*  HGB 12.6*  HCT 36.8*  PLT 131*   BMET Recent Labs    09/18/19 1201  NA 131*  K 3.1*  CL 99  CO2 23  GLUCOSE 114*  BUN 20  CREATININE 1.00  CALCIUM 8.8*   Hepatic Function Latest Ref Rng & Units 09/18/2019 09/14/2019 07/01/2010  Total Protein 6.5 - 8.1 g/dL 7.0 7.2 7.0  Albumin 3.5 - 5.0 g/dL 3.8 3.9 4.2  AST 15 - 41 U/L 338(H) 258(H) 22  ALT 0 - 44 U/L 329(H) 267(H) 17  Alk Phosphatase 38 - 126 U/L 436(H) 362(H) 47  Total Bilirubin 0.3 - 1.2 mg/dL 1.8(H) 1.3(H) 0.9  Bilirubin, Direct 0.0 - 0.3 mg/dL - - 0.1     PT/INR Recent Labs    09/18/19 1201  LABPROT 16.7*  INR 1.4*    STUDIES: CT Abdomen Pelvis W Contrast  Result Date: 09/18/2019 CLINICAL DATA:  Painless postoperative fever. Biliary stent placement. New diagnosis of pancreatic cancer. EXAM: CT ABDOMEN AND PELVIS WITH CONTRAST TECHNIQUE: Multidetector CT imaging of the abdomen and pelvis was performed using the standard protocol following bolus administration of intravenous contrast. CONTRAST:  137m OMNIPAQUE IOHEXOL 300 MG/ML  SOLN COMPARISON:  CT scan dated 08/25/2019 FINDINGS: Lower chest: No acute abnormalities.  Aortic atherosclerosis. Coronary artery calcifications. Heart size is normal. Lung bases are clear. Hepatobiliary: Diffuse dilatation of the biliary tree including distention of the gallbladder persists after stent placement. The stent appears to be in good position. There is new slight enhancement of the wall of the distended gallbladder is compared to the prior study. There is a new small amount of fluid in the gallbladder fossa. The liver parenchyma is otherwise normal. Pancreas: There is diffuse pancreatic atrophy with chronic dilatation of the pancreatic duct. Slight enhancement of the pancreatic head near the ampulla likely represents tumor. There is no evidence of inflammation around the pancreas. Spleen: Normal. Adrenals/Urinary Tract: Adrenal glands are unremarkable. Kidneys are normal, without renal calculi, focal lesion, or hydronephrosis. Enlargement of the median lobe of the prostate gland. The bladder otherwise appears normal. Stomach/Bowel: Stomach is within normal limits. Appendix appears normal. No evidence of  bowel wall thickening, distention, or inflammatory changes. Vascular/Lymphatic: Extensive aortic atherosclerosis. No adenopathy. Reproductive: Enlarged median lobe of the prostate gland. Other: No ascites. No free air. Musculoskeletal: No acute or significant osseous findings. IMPRESSION: 1. Persistent dilatation of the biliary tree after stent placement. The patient's bilirubin has risen. The possibility of stent obstruction should be considered. Slight new enhancement of the wall of the distended gallbladder with a new tiny amount of fluid around the distended gallbladder. This could represent early infection. 2. Slight enhancement of the pancreatic head near the ampulla consistent with tumor. 3. No evidence of inflammation around the pancreas. 4. No other acute abnormalities. 5. Extensive aortic atherosclerosis. 6. Enlarged median lobe of the prostate gland. Aortic Atherosclerosis  (ICD10-I70.0). Electronically Signed   By: Lorriane Shire M.D.   On: 09/18/2019 13:10   DG Chest Portable 1 View  Result Date: 09/18/2019 CLINICAL DATA:  Persistent fever since pancreatic stent placement 4 days ago. EXAM: PORTABLE CHEST 1 VIEW COMPARISON:  Radiographs 03/16/2019 and 09/06/2018. FINDINGS: 1152 hours. The heart size and mediastinal contours are normal. The lungs are clear. There is no pleural effusion or pneumothorax. No acute osseous findings are identified. There are stable old left-sided rib fractures. Telemetry leads overlie the chest. IMPRESSION: Stable chest. No active cardiopulmonary process. Electronically Signed   By: Richardean Sale M.D.   On: 09/18/2019 12:43    Impression / Plan:   Impression: 1.  Fever/chills status post ERCP/EUS on 09/14/2019: Now with increasing LFTs, increasing and a CT as above with question of slight and new enhancement of the wall of the distended gallbladder with a tiny amount of fluid; consider cholecystitis versus stent malfunction versus other 2.  Pancreatic adenocarcinoma: Recently diagnosed earlier this year 3.  A. fib/CAD: On Eliquis and Plavix-last dose 09/17/2018 1 AM  Plan: 1.  Agree with broad-spectrum antibiotics 2.  Continue to monitor LFTs 3.  As needed antiemetics and analgesics 4.  Patient should remain on clear liquid diet for now 5.  Should hold anticoagulation in case patient needs another procedure in the near future 6.  Please await any further recommendations from Dr. Loletha Carrow later today.  Thank you for your kind consultation, we will continue to follow.  Lavone [facility]  09/18/2019, 2:44 PM  I have reviewed the entire case in detail with the above APP and discussed the plan in detail.  Therefore, I agree with the diagnoses recorded above. In addition,  I have personally interviewed and examined the patient and have personally reviewed any abdominal/pelvic CT scan images. I also reviewed Dr. Audelia Acton recent ERCP report.   Appropriate sized uncovered metal biliary stent was not available that day, so plastic biliary stent placed.  My additional thoughts are as follows:  Biliary sepsis, I suspect he will most likely be bacteremic when blood cultures are resulted.  Stent is in good position on CT scan, it was just placed several days ago, and I suspect it is not occluded.  This is most likely not obstructing a sending cholangitis from recent biliary manipulation in the setting of pancreatic cancer.  While acute cholecystitis can also happen in this circumstance because the stent occludes the cystic duct, and the imaging suggests some possible inflammation in that area, he has no tenderness on exam.  Therefore, no current plans for surgical consult or cholecystostomy tube.  Is on broad-spectrum antibiotics, and I expect that over the next couple days we will see his sepsis continue to subside and his liver labs stabilize  and improvement of the leukocytosis. Mild coagulopathy related to sepsis, follow INR as it should improve with antibiotic coverage and control of sepsis.  Regular diet ordered.  Stool test for occult blood canceled.Not necessary at present.  Patient asked me to update his daughter, which I did by phone this evening.  We will follow closely.  Nelida Meuse III [phone]

## 2019-09-18 NOTE — Progress Notes (Signed)
A consult was received from an ED physician for vanc/cefepime per pharmacy dosing.  The patient's profile has been reviewed for ht/wt/allergies/indication/available labs.   A one time order has been placed for vanc 1g and cefepime 2g.  Further antibiotics/pharmacy consults should be ordered by admitting physician if indicated.                       Thank you, Kara Mead 09/18/2019  12:08 PM

## 2019-09-18 NOTE — Telephone Encounter (Signed)
Ronald Bird, thanks    I spoke with his daughter this morning.  He is clearly feeling poorly over the weekend with low-grade fevers, chills.  He has no abdominal pains but he does not have much of an appetite.  He does have a sore throat, that might of started shortly after his procedure.  He is not coughing.  He is not short of breath.  She and I also discussed that the final biopsy results were positive for adenocarcinoma   [name], He needs CBC and complete metabolic profile this morning on an expedited, stat basis.  It is much easier for him to get this done in [place] I believe.  Can you please coordinate with his daughter to get those labs done and the results faxed to me today.  She understands that if the liver tests show improvement from Thursday morning labs then I would not be certain what is causing his febrile illness.  Certainly during the pandemic we have to wonder if he has coronavirus.  He has not been vaccinated yet.

## 2019-09-18 NOTE — Progress Notes (Signed)
Received pt from ED dept in stable condition. Resting without distress. SRP, RN

## 2019-09-18 NOTE — ED Notes (Signed)
Urinal at bedside.  

## 2019-09-18 NOTE — ED Triage Notes (Signed)
Patient states he had a procedure done 4 days ago where he had a stent placed in his pancreatic duct due to a mass. Patient states he has been having a fever at home.

## 2019-09-18 NOTE — Telephone Encounter (Signed)
LBGI: Ronald Bird After hours call for the on-call MD  Daughter called this morning at 7:05am  Patient had  ERCP/EUS with Dr. Ardis Bird on 09/14/19. The following day he developed temperature to 99.6. Saturday they were 99.4. This morning they are 99.8. Resolves with Tylenol. Doesn't feel well when he has the fever. No other specific complaints.   I told her that I would review with Dr. Ardis Bird this morning when the office opens.

## 2019-09-18 NOTE — Telephone Encounter (Signed)
Pt's daughter Virl Cagey would like to speak with Dr. Ardis Hughs' nurse about pt. She stated that she has other concerns. Pls call her.

## 2019-09-18 NOTE — H&P (Signed)
History and Physical    Ronald Bird [id number] DOB: 1936-07-30 DOA: 09/18/2019  PCP: Venetia Maxon Sharon Mt, MD  Patient coming from: Home.  I have personally briefly reviewed patient's old medical records in [place]  Chief Complaint: Fever.  HPI: Ronald Bird is a 84 y.o. male with medical history significant of BPH, CAD, paroxysmal atrial fibrillation, DJD, GERD, colon polyps, hyperlipidemia, hypertension, recently diagnosed with pancreatic cancer who underwent a biliary stent placement on 09/14/2019 and is returning to the emergency department due to fatigue, malaise for the past 2 days associated with fever since yesterday.  He also stated that he had a frontal headache this morning when he had a fever.  He has been taking Tylenol at home with some improvement.  He complains of mild abdominal pain and mild nausea, but denies emesis, diarrhea or constipation.  He also mentions having acholic stools, which now have a light brown color.  He denies chest pain, palpitations, dyspnea, PND, orthopnea or pitting edema of the lower extremities.  He also denies dysuria, frequency or hematuria, but has nocturia once or twice a night.  No polyuria, polydipsia, polyphagia or blurred vision.  ED Course: Initial vital signs temperature 98.2 F, but shortly after that increased to 101.7 F.  His pulse was 83, respirations 16, blood pressure 121/68 mmHg and O2 sat 98% on room air.  The patient received 2000 mL of NS bolus, Zofran 4 mg IVP, ibuprofen 400 mg p.o. x1, cefepime, metronidazole and vancomycin.  Urinalysis was unremarkable.  SARS coronavirus 2 antigen and PCR were negative.  CBC shows a white count of 11.6, hemoglobin 12.6 g/dL and platelets 131.  PT was 16.7, INR 1.4 and APTT 36.  Lipase was 13.  Lactic acid was normal at 1.2 mmol/L.  CMP shows sodium of 131 and potassium of 3.1 mmol/L.  The rest of the electrolytes are normal when calcium is corrected to albumin.  Renal function is  normal.  Glucose 114 mg/dL.  AST is 338, ALT 329 and alkaline phosphatase 136 units/L.  Alkaline phosphatase was 1.8 mg/dL.  Review of Systems: As per HPI otherwise 10 point review of systems negative.   Past Medical History:  Diagnosis Date  . BPH (benign prostatic hyperplasia)   . Cancer ([place])   . Coronary artery disease   . DJD (degenerative joint disease)   . GERD (gastroesophageal reflux disease)   . History of colonic polyps   . Hypercholesteremia   . Hypertension     Past Surgical History:  Procedure Laterality Date  . BILIARY STENT PLACEMENT N/A 09/14/2019   Procedure: BILIARY STENT PLACEMENT;  Surgeon: Milus Banister, MD;  Location: [facility];  Service: Endoscopy;  Laterality: N/A;  . CATARACT EXTRACTION, BILATERAL  2012   In [facility]  . CORONARY ATHERECTOMY N/A 09/12/2018   Procedure: CORONARY ATHERECTOMY;  Surgeon: Jettie Booze, MD;  Location: [place] [facility];  Service: Cardiovascular;  Laterality: N/A;  mid LAD  . CORONARY BALLOON ANGIOPLASTY N/A 09/12/2018   Procedure: CORONARY BALLOON ANGIOPLASTY;  Surgeon: Jettie Booze, MD;  Location: [place] [facility];  Service: Cardiovascular;  Laterality: N/A;  diag 1  . CORONARY STENT INTERVENTION N/A 09/12/2018   Procedure: CORONARY STENT INTERVENTION;  Surgeon: Jettie Booze, MD;  Location: [place] [facility];  Service: Cardiovascular;  Laterality: N/A;  mid LAD  . ENDOSCOPIC RETROGRADE CHOLANGIOPANCREATOGRAPHY (ERCP) WITH PROPOFOL N/A 09/14/2019   Procedure: ENDOSCOPIC RETROGRADE CHOLANGIOPANCREATOGRAPHY (ERCP) WITH PROPOFOL;  Surgeon: Ardis Hughs,  Melene Plan, MD;  Location: [facility];  Service: Endoscopy;  Laterality: N/A;  . ESOPHAGOGASTRODUODENOSCOPY (EGD) WITH PROPOFOL N/A 09/14/2019   Procedure: ESOPHAGOGASTRODUODENOSCOPY (EGD) WITH PROPOFOL;  Surgeon: Milus Banister, MD;  Location: [facility];  Service: Endoscopy;  Laterality: N/A;  . EUS N/A 09/14/2019   Procedure: UPPER ENDOSCOPIC ULTRASOUND (EUS)  RADIAL;  Surgeon: Milus Banister, MD;  Location: [facility];  Service: Endoscopy;  Laterality: N/A;  CAT 2  . FINE NEEDLE ASPIRATION N/A 09/14/2019   Procedure: FINE NEEDLE ASPIRATION (FNA) LINEAR;  Surgeon: Milus Banister, MD;  Location: [facility];  Service: Endoscopy;  Laterality: N/A;  . LEFT HEART CATH AND CORONARY ANGIOGRAPHY N/A 09/09/2018   Procedure: LEFT HEART CATH AND CORONARY ANGIOGRAPHY;  Surgeon: Burnell Blanks, MD;  Location: [place] [facility];  Service: Cardiovascular;  Laterality: N/A;  . LEFT HEART CATH AND CORONARY ANGIOGRAPHY N/A 09/12/2018   Procedure: LEFT HEART CATH AND CORONARY ANGIOGRAPHY;  Surgeon: Jettie Booze, MD;  Location: [place] [facility];  Service: Cardiovascular;  Laterality: N/A;  . MOHS SURGERY  2012   Skin Cancer removed from nose by [facility] in [place]  . SHOULDER SURGERY       reports that he has never smoked. He has never used smokeless tobacco. He reports that he does not drink alcohol or use drugs.  No Known Allergies  Family History  Problem Relation Age of Onset  . Heart disease Father   . Stroke Father   . Hypertension Father   . Heart disease Mother   . Stroke Mother   . Hypertension Mother   . Atrial fibrillation Brother   . CAD Sister        Patient does not know details   Prior to Admission medications   Medication Sig Start Date End Date Taking? Authorizing Provider  apixaban (ELIQUIS) 5 MG TABS tablet Take 1 tablet (5 mg total) by mouth 2 (two) times daily. 12/08/18  Yes Park Liter, MD  calcium carbonate (TUMS - DOSED IN MG ELEMENTAL CALCIUM) 500 MG chewable tablet Chew 1 tablet by mouth as needed for indigestion or heartburn.   Yes [provider]  Cinnamon 500 MG TABS Take 1,000 tablets by mouth daily at 12 noon.    Yes [provider]  clopidogrel (PLAVIX) 75 MG tablet Take 75 mg by mouth daily. 08/30/19  Yes [provider]  Cyanocobalamin (B-12) 2500 MCG SUBL Place 2,500 mg  under the tongue daily at 12 noon.    Yes [provider]  docusate sodium (COLACE) 100 MG capsule Take 100 mg by mouth daily.   Yes [provider]  glucosamine-chondroitin 500-400 MG tablet Take 1 tablet by mouth daily.     Yes [provider]  hydrochlorothiazide (MICROZIDE) 12.5 MG capsule TAKE 1 CAPSULE BY MOUTH ONCE DAILY. Patient taking differently: Take 12.5 mg by mouth daily.  05/29/19  Yes Park Liter, MD  levocetirizine (XYZAL) 5 MG tablet Take 5 mg by mouth at bedtime.    Yes [provider]  levothyroxine (SYNTHROID, LEVOTHROID) 25 MCG tablet Take 25 mcg by mouth daily before breakfast.   Yes [provider]  lisinopril (ZESTRIL) 40 MG tablet Take 1 tablet (40 mg total) by mouth daily. Patient taking differently: Take 20 mg by mouth 2 (two) times daily.  12/08/18  Yes Park Liter, MD  Lycopene 10 MG CAPS Take 10 mg by mouth daily.    Yes [provider]  magnesium  oxide (MAG-OX) 400 MG tablet Take 400 mg by mouth daily at 12 noon.    Yes [provider]  Methylcellulose, Laxative, (CITRUCEL PO) Take 1 tablet by mouth daily at 12 noon.    Yes [provider]  metoprolol tartrate (LOPRESSOR) 25 MG tablet Take 1 tablet (25 mg total) by mouth 2 (two) times daily. 12/08/18  Yes Park Liter, MD  Multiple Vitamin (MULTIVITAMIN) capsule Take 1 capsule by mouth daily at 12 noon.    Yes [provider]  nitroGLYCERIN (NITROSTAT) 0.4 MG SL tablet Place 1 tablet (0.4 mg total) under the tongue every 5 (five) minutes as needed for chest pain. 09/13/18  Yes Daune Perch, NP  omega-3 acid ethyl esters (LOVAZA) 1 g capsule Take 1,400 mg by mouth 2 (two) times daily.   Yes [provider]  pantoprazole (PROTONIX) 40 MG tablet Take 1 tablet (40 mg total) by mouth daily. 30 mins before a meal  07/01/11  Yes Noralee Space, MD  Probiotic Product (PROBIOTIC DAILY PO) Take 1 tablet by mouth daily  at 12 noon.   Yes [provider]  rosuvastatin (CRESTOR) 10 MG tablet Take 1 tablet (10 mg total) by mouth daily at 6 PM. Patient taking differently: Take 20 mg by mouth every evening.  12/08/18 12/03/19 Yes Park Liter, MD  Saw Palmetto, Serenoa repens, 1000 MG CAPS Take 2,000 mg by mouth 2 (two) times daily.    Yes [provider]  tamsulosin (FLOMAX) 0.4 MG CAPS capsule Take 0.4 mg by mouth at bedtime. 07/28/19  Yes [provider]  Vitamin D, Ergocalciferol, (DRISDOL) 50000 units CAPS capsule Take 50,000 Units by mouth every Tuesday.    Yes [provider]  ezetimibe (ZETIA) 10 MG tablet Take 1 tablet (10 mg total) by mouth daily. Patient not taking: Reported on 09/04/2019 12/08/18   Park Liter, MD  fluticasone [facility] 50 MCG/ACT nasal spray Place 2 sprays into both nostrils daily for 30 days. Patient not taking: Reported on 09/04/2019 09/14/18 12/08/19  Daune Perch, NP  loratadine (CLARITIN) 10 MG tablet Take 1 tablet (10 mg total) by mouth daily for 30 days. Patient not taking: Reported on 09/04/2019 09/13/18 12/08/19  Daune Perch, NP    Physical Exam: Vitals:   09/18/19 1441 09/18/19 1445 09/18/19 1500 09/18/19 1530  BP: (!) 122/56  (!) 141/71 (!) 150/74  Pulse: 66 61 (!) 55 (!) 57  Resp: 18 15 17 14   Temp:      TempSrc:      SpO2: 99% 99% 97% 99%  Weight:      Height:        Constitutional: NAD, calm, comfortable Eyes: PERRL, lids and conjunctivae mildly injected.  Positive mild icterus. ENMT: Mucous membranes are moist. Posterior pharynx clear of any exudate or lesions. Neck: normal, supple, no masses, no thyromegaly Respiratory: Decreased breath sounds in bases, otherwise clear to auscultation bilaterally, no wheezing, no crackles. Normal respiratory effort. No accessory muscle use.  Cardiovascular: Regular rate and rhythm, no murmurs / rubs / gallops. No extremity edema. 2+ pedal pulses. No carotid bruits.  Abdomen:  Nondistended.  BS present.  Positive RUQ and epigastric tenderness, no guarding or rebound, no masses palpated. No hepatosplenomegaly. Musculoskeletal: no clubbing / cyanosis.  Good ROM, no contractures. Normal muscle tone.  Skin: Small areas of ecchymosis on extremities. Neurologic: CN 2-12 grossly intact. Sensation intact, DTR normal. Strength 5/5 in all 4.  Psychiatric: Normal judgment and insight. Alert and oriented  x 3. Normal mood.   Labs on Admission: I have personally reviewed following labs and imaging studies  CBC: Recent Labs  Lab 09/14/19 0910 09/18/19 1201  WBC 5.5 11.6*  NEUTROABS  --  10.6*  HGB 13.2 12.6*  HCT 37.6* 36.8*  MCV 94.7 94.6  PLT 141* [id number]*   Basic Metabolic Panel: Recent Labs  Lab 09/14/19 0910 09/18/19 1201  NA 134* 131*  K 3.5 3.1*  CL 101 99  CO2 24 23  GLUCOSE 109* 114*  BUN 14 20  CREATININE 0.85 1.00  CALCIUM 9.0 8.8*   GFR: Estimated Creatinine Clearance: 63.2 mL/min (by C-G formula based on SCr of 1 mg/dL). Liver Function Tests: Recent Labs  Lab 09/14/19 0910 09/18/19 1201  AST 258* 338*  ALT 267* 329*  ALKPHOS 362* 436*  BILITOT 1.3* 1.8*  PROT 7.2 7.0  ALBUMIN 3.9 3.8   Recent Labs  Lab 09/18/19 1201  LIPASE 13   No results for input(s): AMMONIA in the last 168 hours. Coagulation Profile: Recent Labs  Lab 09/14/19 0910 09/18/19 1201  INR 1.0 1.4*   Cardiac Enzymes: No results for input(s): CKTOTAL, CKMB, CKMBINDEX, TROPONINI in the last 168 hours. BNP (last 3 results) No results for input(s): PROBNP in the last 8760 hours. HbA1C: No results for input(s): HGBA1C in the last 72 hours. CBG: Recent Labs  Lab 09/18/19 1134  GLUCAP 107*   Lipid Profile: No results for input(s): CHOL, HDL, LDLCALC, TRIG, CHOLHDL, LDLDIRECT in the last 72 hours. Thyroid Function Tests: No results for input(s): TSH, T4TOTAL, FREET4, T3FREE, THYROIDAB in the last 72 hours. Anemia Panel: No results for input(s): VITAMINB12,  FOLATE, FERRITIN, TIBC, IRON, RETICCTPCT in the last 72 hours. Urine analysis:    Component Value Date/Time   COLORURINE AMBER (A) 09/18/2019 1152   APPEARANCEUR CLEAR 09/18/2019 1152   LABSPEC 1.019 09/18/2019 1152   PHURINE 6.0 09/18/2019 1152   GLUCOSEU NEGATIVE 09/18/2019 1152   GLUCOSEU NEGATIVE 05/11/2007 0948   HGBUR NEGATIVE 09/18/2019 1152   BILIRUBINUR NEGATIVE 09/18/2019 1152   KETONESUR NEGATIVE 09/18/2019 1152   PROTEINUR NEGATIVE 09/18/2019 1152   UROBILINOGEN 0.2 mg/dL 05/11/2007 0948   NITRITE NEGATIVE 09/18/2019 1152   LEUKOCYTESUR NEGATIVE 09/18/2019 1152    Radiological Exams on Admission: CT Abdomen Pelvis W Contrast  Result Date: 09/18/2019 CLINICAL DATA:  Painless postoperative fever. Biliary stent placement. New diagnosis of pancreatic cancer. EXAM: CT ABDOMEN AND PELVIS WITH CONTRAST TECHNIQUE: Multidetector CT imaging of the abdomen and pelvis was performed using the standard protocol following bolus administration of intravenous contrast. CONTRAST:  135mL OMNIPAQUE IOHEXOL 300 MG/ML  SOLN COMPARISON:  CT scan dated 08/25/2019 FINDINGS: Lower chest: No acute abnormalities. Aortic atherosclerosis. Coronary artery calcifications. Heart size is normal. Lung bases are clear. Hepatobiliary: Diffuse dilatation of the biliary tree including distention of the gallbladder persists after stent placement. The stent appears to be in good position. There is new slight enhancement of the wall of the distended gallbladder is compared to the prior study. There is a new small amount of fluid in the gallbladder fossa. The liver parenchyma is otherwise normal. Pancreas: There is diffuse pancreatic atrophy with chronic dilatation of the pancreatic duct. Slight enhancement of the pancreatic head near the ampulla likely represents tumor. There is no evidence of inflammation around the pancreas. Spleen: Normal. Adrenals/Urinary Tract: Adrenal glands are unremarkable. Kidneys are normal,  without renal calculi, focal lesion, or hydronephrosis. Enlargement of the median lobe of the prostate gland. The bladder otherwise appears  normal. Stomach/Bowel: Stomach is within normal limits. Appendix appears normal. No evidence of bowel wall thickening, distention, or inflammatory changes. Vascular/Lymphatic: Extensive aortic atherosclerosis. No adenopathy. Reproductive: Enlarged median lobe of the prostate gland. Other: No ascites. No free air. Musculoskeletal: No acute or significant osseous findings. IMPRESSION: 1. Persistent dilatation of the biliary tree after stent placement. The patient's bilirubin has risen. The possibility of stent obstruction should be considered. Slight new enhancement of the wall of the distended gallbladder with a new tiny amount of fluid around the distended gallbladder. This could represent early infection. 2. Slight enhancement of the pancreatic head near the ampulla consistent with tumor. 3. No evidence of inflammation around the pancreas. 4. No other acute abnormalities. 5. Extensive aortic atherosclerosis. 6. Enlarged median lobe of the prostate gland. Aortic Atherosclerosis (ICD10-I70.0). Electronically Signed   By: Lorriane Shire M.D.   On: 09/18/2019 13:10   DG Chest Portable 1 View  Result Date: 09/18/2019 CLINICAL DATA:  Persistent fever since pancreatic stent placement 4 days ago. EXAM: PORTABLE CHEST 1 VIEW COMPARISON:  Radiographs 03/16/2019 and 09/06/2018. FINDINGS: 1152 hours. The heart size and mediastinal contours are normal. The lungs are clear. There is no pleural effusion or pneumothorax. No acute osseous findings are identified. There are stable old left-sided rib fractures. Telemetry leads overlie the chest. IMPRESSION: Stable chest. No active cardiopulmonary process. Electronically Signed   By: Richardean Sale M.D.   On: 09/18/2019 12:43    EKG: Independently reviewed. Vent. rate 74 BPM PR interval * ms QRS duration 100 ms QT/QTc 386/429 ms P-R-T  axes 41 13 9 Sinus rhythm Prolonged PR interval Borderline T wave abnormalities  Assessment/Plan Principal Problem:   Acute cholangitis Admit to telemetry/inpatient. Clear liquids diet. N.p.o. after midnight. Antiemetics as needed. Analgesics as needed. Cefepime per pharmacy. Metronidazole 500 mg IV every 8 hours. Vancomycin per pharmacy. Follow-up CBC and CMP in a.m.  Active Problems:   Hypokalemia Replacing. Follow-up potassium level. Check magnesium and supplement as needed.    HYPERCHOLESTEROLEMIA Hold Crestor.    Essential hypertension Hold hydrochlorothiazide. Continue lisinopril 20 mg p.o. twice daily. Continue metoprolol 25 mg p.o. twice daily.    Pancreatic adenocarcinoma Treatment and follow-up per oncology.    Paroxysmal atrial fibrillation (HCC) Hold Eliquis given abnormal LFTs/Procedure.    Coronary artery disease Hold rosuvastatin due to hepatic dysfunction.    DVT prophylaxis: SCDs. Code Status: Full code. Family Communication: Disposition Plan: Admit for IV antibiotic therapy and electrolyte replacement. Consults called: Gastroenterology is consulting.  Please see consult note. Admission status: Inpatient/telemetry.   Reubin Milan MD [facility]  If 7PM-7AM, please contact night-coverage [email or web address]  09/18/2019, 4:04 PM   This document was prepared using Dragon voice recognition software and may contain some unintended transcription errors.

## 2019-09-18 NOTE — Telephone Encounter (Signed)
See alternate phone note  

## 2019-09-18 NOTE — Progress Notes (Addendum)
Pharmacy Antibiotic Note  Ronald Bird is a 84 y.o. male admitted on 09/18/2019 with suspected intra-abdominal infection.  PMH significant for CAD, HTN, HLD, AFib (on Eliquis), pancreatic cancer, and recent biliary stent placed on 09/14/19.  Patient presents with fever and chills.  Patient received Cefepime 2gm and Vancomycin 1gm IV x 1 dose each in the ED.  Pharmacy has been consulted for Vancomycin and Cefepime dosing.  Plan:  Cefepime 2gm IV q8h Vancomycin 1500 mg IV Q 24 hrs. Goal AUC 400-550; Expected AUC: 475 SCr used: 1.00  Flagyl per MD  Height: 6\' 1"  (185.4 cm) Weight: 170 lb (77.1 kg) IBW/kg (Calculated) : 79.9  Temp (24hrs), Avg:100 F (37.8 C), Min:98.2 F (36.8 C), Max:101.7 F (38.7 C)  Recent Labs  Lab 09/14/19 0910 09/18/19 1201  WBC 5.5 11.6*  CREATININE 0.85 1.00  LATICACIDVEN  --  1.2    Estimated Creatinine Clearance: 63.2 mL/min (by C-G formula based on SCr of 1 mg/dL).    No Known Allergies  Antimicrobials this admission: 2/8 Vanc >>   2/8 Cefepime >>   2/8 Flagyl >>  Dose adjustments this admission:   Microbiology results: 2/8 BCx: sent 2/8 UCx: sent  2/8 Covid: negative 2/8 Influenza A & B: negative    Thank you for allowing pharmacy to be a part of this patient's care.  Everette Rank, PharmD 09/18/2019 3:14 PM

## 2019-09-18 NOTE — Plan of Care (Signed)
  Problem: Education: Goal: Knowledge of General Education information will improve Description Including pain rating scale, medication(s)/side effects and non-pharmacologic comfort measures Outcome: Progressing   

## 2019-09-18 NOTE — ED Provider Notes (Signed)
[facility] DEPT Provider Note   CSN: [id number] Arrival date & time: 09/18/19  1107     History Chief Complaint  Patient presents with  . Fever  . Weakness    Ronald Bird is a 84 y.o. male with a history of CAD, hypertension, hypercholesterolemia, paroxysmal A. fib on Eliquis, and recently diagnosed pancreatic cancer (adenocarcinoma) status post ERCP/EUS with biliary stent by Dr. Ardis Hughs 09/14/19 who presents to the ED with complaints of fever over the past couple of days. Patient states that for the past 2-3 days he has felt generally poorly with generalized weakness, poor PO intake, chills, & fevers w/ temp max around 100 at home. Taking tylenol with some improvement- most recent dose was 500 mg @ 10:30AM prior to arrival today. No other alleviating/aggravating factors. Called his GI office today and ultimately decision was made for patient to come to the ED for further assessment. He states his throat feels a bit sore from the procedure but otherwise no significant pain. Denies abdominal pain, chest pain, emesis, diarrhea, melena, hematochezia, dysuria, cough, or dyspnea.   HPI     Past Medical History:  Diagnosis Date  . BPH (benign prostatic hyperplasia)   . Cancer ([place])   . Coronary artery disease   . DJD (degenerative joint disease)   . GERD (gastroesophageal reflux disease)   . History of colonic polyps   . Hypercholesteremia   . Hypertension     Patient Active Problem List   Diagnosis Date Noted  . Biliary obstruction   . Pancreatic lesion   . Coronary artery disease 09/23/2018  . Unstable angina ([place]) 09/08/2018  . Paroxysmal atrial fibrillation ([place]) 08/30/2017  . Ascending aortic aneurysm ([place]) 08/30/2017  . Palpitations 03/29/2017  . Weakness 12/30/2010  . Fatigue 12/30/2010  . HYPERCHOLESTEROLEMIA 07/03/2008  . COLONIC POLYPS 05/15/2008  . Essential hypertension 05/15/2008  . GERD 05/15/2008  . DEGENERATIVE JOINT DISEASE  05/15/2008  . BENIGN PROSTATIC HYPERTROPHY, HX OF 05/15/2008    Past Surgical History:  Procedure Laterality Date  . BILIARY STENT PLACEMENT N/A 09/14/2019   Procedure: BILIARY STENT PLACEMENT;  Surgeon: Milus Banister, MD;  Location: [facility];  Service: Endoscopy;  Laterality: N/A;  . CATARACT EXTRACTION, BILATERAL  2012   In [facility]  . CORONARY ATHERECTOMY N/A 09/12/2018   Procedure: CORONARY ATHERECTOMY;  Surgeon: Jettie Booze, MD;  Location: [facility];  Service: Cardiovascular;  Laterality: N/A;  mid LAD  . CORONARY BALLOON ANGIOPLASTY N/A 09/12/2018   Procedure: CORONARY BALLOON ANGIOPLASTY;  Surgeon: Jettie Booze, MD;  Location: [facility];  Service: Cardiovascular;  Laterality: N/A;  diag 1  . CORONARY STENT INTERVENTION N/A 09/12/2018   Procedure: CORONARY STENT INTERVENTION;  Surgeon: Jettie Booze, MD;  Location: [facility];  Service: Cardiovascular;  Laterality: N/A;  mid LAD  . ENDOSCOPIC RETROGRADE CHOLANGIOPANCREATOGRAPHY (ERCP) WITH PROPOFOL N/A 09/14/2019   Procedure: ENDOSCOPIC RETROGRADE CHOLANGIOPANCREATOGRAPHY (ERCP) WITH PROPOFOL;  Surgeon: Milus Banister, MD;  Location: [facility];  Service: Endoscopy;  Laterality: N/A;  . ESOPHAGOGASTRODUODENOSCOPY (EGD) WITH PROPOFOL N/A 09/14/2019   Procedure: ESOPHAGOGASTRODUODENOSCOPY (EGD) WITH PROPOFOL;  Surgeon: Milus Banister, MD;  Location: [facility];  Service: Endoscopy;  Laterality: N/A;  . EUS N/A 09/14/2019   Procedure: UPPER ENDOSCOPIC ULTRASOUND (EUS) RADIAL;  Surgeon: Milus Banister, MD;  Location: [facility];  Service: Endoscopy;  Laterality: N/A;  CAT 2  . FINE NEEDLE ASPIRATION N/A 09/14/2019   Procedure:  FINE NEEDLE ASPIRATION (FNA) LINEAR;  Surgeon: Milus Banister, MD;  Location: [facility];  Service: Endoscopy;  Laterality: N/A;  . LEFT HEART CATH AND CORONARY ANGIOGRAPHY N/A 09/09/2018   Procedure: LEFT HEART CATH AND CORONARY ANGIOGRAPHY;  Surgeon:  Burnell Blanks, MD;  Location: [facility];  Service: Cardiovascular;  Laterality: N/A;  . LEFT HEART CATH AND CORONARY ANGIOGRAPHY N/A 09/12/2018   Procedure: LEFT HEART CATH AND CORONARY ANGIOGRAPHY;  Surgeon: Jettie Booze, MD;  Location: [facility];  Service: Cardiovascular;  Laterality: N/A;  . MOHS SURGERY  2012   Skin Cancer removed from nose by Moh's in [place]  . SHOULDER SURGERY         Family History  Problem Relation Age of Onset  . Heart disease Father   . Stroke Father   . Hypertension Father   . Heart disease Mother   . Stroke Mother   . Hypertension Mother   . Atrial fibrillation Brother   . CAD Sister        Patient does not know details    Social History   Tobacco Use  . Smoking status: Never Smoker  . Smokeless tobacco: Never Used  Substance Use Topics  . Alcohol use: No  . Drug use: No    Home Medications Prior to Admission medications   Medication Sig Start Date End Date Taking? Authorizing Provider  apixaban (ELIQUIS) 5 MG TABS tablet Take 1 tablet (5 mg total) by mouth 2 (two) times daily. 12/08/18   Park Liter, MD  calcium carbonate (TUMS - DOSED IN MG ELEMENTAL CALCIUM) 500 MG chewable tablet Chew 1 tablet by mouth as needed for indigestion or heartburn.    [provider]  Cinnamon 500 MG TABS Take 1,000 tablets by mouth daily.     [provider]  clopidogrel (PLAVIX) 75 MG tablet Take 75 mg by mouth daily. 08/30/19   [provider]  Cyanocobalamin (B-12) 2500 MCG SUBL Place 2,500 mg under the tongue daily.     [provider]  docusate sodium (COLACE) 100 MG capsule Take 100 mg by mouth daily.    [provider]  ezetimibe (ZETIA) 10 MG tablet Take 1 tablet (10 mg total) by mouth daily. Patient not taking: Reported on 09/04/2019 12/08/18   Park Liter, MD  fluticasone [facility]) 50 MCG/ACT nasal spray Place 2 sprays into both nostrils daily for 30  days. Patient not taking: Reported on 09/04/2019 09/14/18 12/08/19  Daune Perch, NP  glucosamine-chondroitin 500-400 MG tablet Take 1 tablet by mouth daily.      [provider]  hydrochlorothiazide (MICROZIDE) 12.5 MG capsule TAKE 1 CAPSULE BY MOUTH ONCE DAILY. Patient not taking: Reported on 09/04/2019 05/29/19   Park Liter, MD  ketoconazole (NIZORAL) 2 % shampoo Apply 1 application topically daily as needed. 08/02/19   [provider]  Lactobacillus (PROBIOTIC ACIDOPHILUS PO) Take 30,000 mcg by mouth daily.     [provider]  levocetirizine (XYZAL) 5 MG tablet Take 5 mg by mouth at bedtime.     [provider]  levothyroxine (SYNTHROID, LEVOTHROID) 25 MCG tablet Take 25 mcg by mouth daily before breakfast.    [provider]  lisinopril (ZESTRIL) 40 MG tablet Take 1 tablet (40 mg total) by mouth daily. Patient taking differently: Take 20 mg by mouth 2 (two) times daily.  12/08/18   Park Liter, MD  loratadine (CLARITIN) 10 MG tablet Take 1 tablet (10  mg total) by mouth daily for 30 days. Patient not taking: Reported on 09/04/2019 09/13/18 12/08/19  Daune Perch, NP  Lycopene 10 MG CAPS Take 1 capsule by mouth daily.      [provider]  magnesium oxide (MAG-OX) 400 MG tablet Take 400 mg by mouth daily.    [provider]  Methylcellulose, Laxative, (CITRUCEL PO) Take 1 tablet by mouth daily.     [provider]  metoprolol tartrate (LOPRESSOR) 25 MG tablet Take 1 tablet (25 mg total) by mouth 2 (two) times daily. 12/08/18   Park Liter, MD  Multiple Vitamin (MULTIVITAMIN) capsule Take 1 capsule by mouth daily.      [provider]  nitroGLYCERIN (NITROSTAT) 0.4 MG SL tablet Place 1 tablet (0.4 mg total) under the tongue every 5 (five) minutes as needed for chest pain. 09/13/18   Daune Perch, NP  omega-3 acid ethyl esters (LOVAZA) 1 g capsule Take 1,400 mg by mouth 2 (two) times daily.     [provider]  pantoprazole (PROTONIX) 40 MG tablet Take 1 tablet (40 mg total) by mouth daily. 30 mins before a meal  07/01/11   Noralee Space, MD  rosuvastatin (CRESTOR) 10 MG tablet Take 1 tablet (10 mg total) by mouth daily at 6 PM. Patient taking differently: Take 20 mg by mouth every evening.  12/08/18 12/03/19  Park Liter, MD  Saw Palmetto, Serenoa repens, 1000 MG CAPS Take 2,000 mg by mouth 2 (two) times daily.     [provider]  tamsulosin (FLOMAX) 0.4 MG CAPS capsule Take 0.4 mg by mouth at bedtime. 07/28/19   [provider]  Vitamin D, Ergocalciferol, (DRISDOL) 50000 units CAPS capsule Take 50,000 Units by mouth every Tuesday.     [provider]    Allergies    Patient has no allergy information on record.  Review of Systems   Review of Systems  Constitutional: Positive for appetite change, chills and fever.  HENT: Positive for sore throat.   Respiratory: Negative for cough and shortness of breath.   Cardiovascular: Negative for chest pain.  Gastrointestinal: Negative for abdominal pain, blood in stool, constipation, diarrhea and vomiting.  Genitourinary: Negative for dysuria.  Neurological: Positive for weakness (generalized). Negative for syncope.  All other systems reviewed and are negative.   Physical Exam Updated Vital Signs BP 121/68 (BP Location: Right Arm)   Pulse 83   Temp 98.2 F (36.8 C) (Oral)   Resp 16   Ht 6\' 1"  (1.854 m)   Wt 77.1 kg   SpO2 98%   BMI 22.43 kg/m   Physical Exam Vitals and nursing note reviewed.  Constitutional:      General: He is not in acute distress.    Appearance: He is well-developed. He is not toxic-appearing.  HENT:     Head: Normocephalic and atraumatic.  Eyes:     General:        Right eye: No discharge.        Left eye: No discharge.     Conjunctiva/sclera: Conjunctivae normal.     Pupils: Pupils are equal, round, and reactive to light.  Cardiovascular:     Rate  and Rhythm: Normal rate and regular rhythm.  Pulmonary:     Effort: Pulmonary effort is normal. No respiratory distress.     Breath sounds: Normal breath sounds. No wheezing, rhonchi or rales.  Abdominal:     General: There is no distension.     Palpations:  Abdomen is soft.     Tenderness: There is no abdominal tenderness. There is no right CVA tenderness, left CVA tenderness, guarding or rebound.  Musculoskeletal:     Cervical back: Neck supple. No rigidity.  Skin:    General: Skin is warm and dry.     Findings: No rash.  Neurological:     Mental Status: He is alert.     Comments: Clear speech.   Psychiatric:        Behavior: Behavior normal.     ED Results / Procedures / Treatments   Labs (all labs ordered are listed, but only abnormal results are displayed) Labs Reviewed  CBG MONITORING, ED - Abnormal; Notable for the following components:      Result Value   Glucose-Capillary 107 (*)    All other components within normal limits  URINALYSIS, ROUTINE W REFLEX MICROSCOPIC  COMPREHENSIVE METABOLIC PANEL  CBC WITH DIFFERENTIAL/PLATELET  LIPASE, BLOOD    EKG EKG Interpretation  Date/Time:  Monday September 18 2019 11:32:32 EST Ventricular Rate:  74 PR Interval:    QRS Duration: 100 QT Interval:  386 QTC Calculation: 429 R Axis:   13 Text Interpretation: Sinus rhythm Prolonged PR interval Borderline T wave abnormalities Confirmed by Veryl Speak [phone]) on 09/18/2019 11:37:49 AM   Radiology CT Abdomen Pelvis W Contrast  Result Date: 09/18/2019 CLINICAL DATA:  Painless postoperative fever. Biliary stent placement. New diagnosis of pancreatic cancer. EXAM: CT ABDOMEN AND PELVIS WITH CONTRAST TECHNIQUE: Multidetector CT imaging of the abdomen and pelvis was performed using the standard protocol following bolus administration of intravenous contrast. CONTRAST:  164mL OMNIPAQUE IOHEXOL 300 MG/ML  SOLN COMPARISON:  CT scan dated 08/25/2019 FINDINGS: Lower chest: No acute  abnormalities. Aortic atherosclerosis. Coronary artery calcifications. Heart size is normal. Lung bases are clear. Hepatobiliary: Diffuse dilatation of the biliary tree including distention of the gallbladder persists after stent placement. The stent appears to be in good position. There is new slight enhancement of the wall of the distended gallbladder is compared to the prior study. There is a new small amount of fluid in the gallbladder fossa. The liver parenchyma is otherwise normal. Pancreas: There is diffuse pancreatic atrophy with chronic dilatation of the pancreatic duct. Slight enhancement of the pancreatic head near the ampulla likely represents tumor. There is no evidence of inflammation around the pancreas. Spleen: Normal. Adrenals/Urinary Tract: Adrenal glands are unremarkable. Kidneys are normal, without renal calculi, focal lesion, or hydronephrosis. Enlargement of the median lobe of the prostate gland. The bladder otherwise appears normal. Stomach/Bowel: Stomach is within normal limits. Appendix appears normal. No evidence of bowel wall thickening, distention, or inflammatory changes. Vascular/Lymphatic: Extensive aortic atherosclerosis. No adenopathy. Reproductive: Enlarged median lobe of the prostate gland. Other: No ascites. No free air. Musculoskeletal: No acute or significant osseous findings. IMPRESSION: 1. Persistent dilatation of the biliary tree after stent placement. The patient's bilirubin has risen. The possibility of stent obstruction should be considered. Slight new enhancement of the wall of the distended gallbladder with a new tiny amount of fluid around the distended gallbladder. This could represent early infection. 2. Slight enhancement of the pancreatic head near the ampulla consistent with tumor. 3. No evidence of inflammation around the pancreas. 4. No other acute abnormalities. 5. Extensive aortic atherosclerosis. 6. Enlarged median lobe of the prostate gland. Aortic  Atherosclerosis (ICD10-I70.0). Electronically Signed   By: Lorriane Shire M.D.   On: 09/18/2019 13:10   DG Chest Portable 1 View  Result Date: 09/18/2019 CLINICAL DATA:  Persistent fever since pancreatic stent placement 4 days ago. EXAM: PORTABLE CHEST 1 VIEW COMPARISON:  Radiographs 03/16/2019 and 09/06/2018. FINDINGS: 1152 hours. The heart size and mediastinal contours are normal. The lungs are clear. There is no pleural effusion or pneumothorax. No acute osseous findings are identified. There are stable old left-sided rib fractures. Telemetry leads overlie the chest. IMPRESSION: Stable chest. No active cardiopulmonary process. Electronically Signed   By: Richardean Sale M.D.   On: 09/18/2019 12:43    Procedures .Critical Care Performed by: Amaryllis Dyke, PA-C Authorized by: Amaryllis Dyke, PA-C     (including critical care time) CRITICAL CARE Performed by: Kennith Maes   Total critical care time: 35 minutes  Critical care time was exclusive of separately billable procedures and treating other patients.  Critical care was necessary to treat or prevent imminent or life-threatening deterioration.  Critical care was time spent personally by me on the following activities: development of treatment plan with patient and/or surrogate as well as nursing, discussions with consultants, evaluation of patient's response to treatment, examination of patient, obtaining history from patient or surrogate, ordering and performing treatments and interventions, ordering and review of laboratory studies, ordering and review of radiographic studies, pulse oximetry and re-evaluation of patient's condition.  Medications Ordered in ED Medications  sodium chloride (PF) 0.9 % injection (has no administration in time range)  sodium chloride flush (NS) 0.9 % injection 3 mL (3 mLs Intravenous Given 09/18/19 1207)  sodium chloride 0.9 % bolus 500 mL (0 mLs Intravenous Stopped 09/18/19 1323)    ceFEPIme (MAXIPIME) 2 g in sodium chloride 0.9 % 100 mL IVPB (0 g Intravenous Stopped 09/18/19 1322)  metroNIDAZOLE (FLAGYL) IVPB 500 mg (0 mg Intravenous Stopped 09/18/19 1348)  vancomycin (VANCOCIN) IVPB 1000 mg/200 mL premix (0 mg Intravenous Stopped 09/18/19 1440)  ibuprofen (ADVIL) tablet 400 mg (400 mg Oral Given 09/18/19 1249)  iohexol (OMNIPAQUE) 300 MG/ML solution 100 mL (100 mLs Intravenous Contrast Given 09/18/19 1232)  sodium chloride 0.9 % bolus 500 mL (0 mLs Intravenous Stopped 09/18/19 1348)  ondansetron (ZOFRAN) injection 4 mg (4 mg Intravenous Given 09/18/19 1249)  sodium chloride 0.9 % bolus 1,000 mL (1,000 mLs Intravenous New Bag/Given 09/18/19 1440)    ED Course  I have reviewed the triage vital signs and the nursing notes.  Pertinent labs & imaging results that were available during my care of the patient were reviewed by me and considered in my medical decision making (see chart for details).    BENNET SPECTOR was evaluated in Emergency Department on 09/18/2019 for the symptoms described in the history of present illness. He/she was evaluated in the context of the global COVID-19 pandemic, which necessitated consideration that the patient might be at risk for infection with the SARS-CoV-2 virus that causes COVID-19. Institutional protocols and algorithms that pertain to the evaluation of patients at risk for COVID-19 are in a state of rapid change based on information released by regulatory bodies including the CDC and federal and state organizations. These policies and algorithms were followed during the patient's care in the ED.  MDM Rules/Calculators/A&P                      Patient with recent ERCP/EUS with biliary stent placement and newly diagnosed pancreatic adenocarcinoma presents to the emergency department with complaints of fever, chills, and generalized weakness.  On arrival rectal temp was 101.7.  Vitals otherwise fairly normal.  Abdomen is nontender without peritoneal signs.  We will start broad-spectrum antibiotics.  Start fluids.  Labs, chest x-ray, and CT abdomen/pelvis.  EKG: Sinus rhythm, prolonged PR, borderline T waves.  Urinalysis: No UTI CBC: Leukocytosis @ 11.6 w/ left shift. Mild anemia.  CMP: Uptrending LFTs & T bili. Renal function WNL. Mild electrolyte abnormalities as above.  Lipase: WNL APTT: WNL PT/INR: mild elevation  CXR: No acute process, no pneumonia.  COVID test: Negative  CT A/P: Persistent dilatation of the biliary tree after stent placement. The patient's bilirubin has risen. The possibility of stent obstruction should be considered. Slight new enhancement of the wall of the distended gallbladder with a new tiny amount of fluid around the distended gallbladder. This could represent early infection. Slight enhancement of the pancreatic head near the ampulla consistent with tumor. No evidence of inflammation around the pancreas. No other acute abnormalities.   --> will discuss w/ GI.   Pressures remain w/ MAP > 65 and systolic BP [id number], however a bit soft 102/53- additional fluids ordered.   14:29: CONSULT: Discussed with Ellouise Newer PA-C w/ [facility]- will see in consultation, requesting hospitalist service admission.   14:47: CONSULT: Discussed with hospitalist Dr. Olevia Bowens- accepts admission.   Findings & results discussed with patient who is in agreement.   Findings and plan of care discussed with supervising physician Dr. Stark Jock who has evaluated patient & is in agreement.   Final Clinical Impression(s) / ED Diagnoses Final diagnoses:  Fever, unspecified fever cause  Elevated LFTs    Rx / DC Orders ED Discharge Orders    None       Amaryllis Dyke, PA-C 09/18/19 1448    Veryl Speak, MD 09/19/19 [phone]

## 2019-09-18 NOTE — Telephone Encounter (Signed)
I agree it is best that he go to the ER

## 2019-09-19 ENCOUNTER — Inpatient Hospital Stay (HOSPITAL_COMMUNITY): Payer: Medicare Other

## 2019-09-19 ENCOUNTER — Encounter (HOSPITAL_COMMUNITY): Payer: Self-pay | Admitting: Internal Medicine

## 2019-09-19 ENCOUNTER — Telehealth: Payer: Self-pay

## 2019-09-19 DIAGNOSIS — I2510 Atherosclerotic heart disease of native coronary artery without angina pectoris: Secondary | ICD-10-CM

## 2019-09-19 DIAGNOSIS — E876 Hypokalemia: Secondary | ICD-10-CM

## 2019-09-19 DIAGNOSIS — K8309 Other cholangitis: Secondary | ICD-10-CM

## 2019-09-19 DIAGNOSIS — I10 Essential (primary) hypertension: Secondary | ICD-10-CM

## 2019-09-19 DIAGNOSIS — E7800 Pure hypercholesterolemia, unspecified: Secondary | ICD-10-CM

## 2019-09-19 DIAGNOSIS — I480 Paroxysmal atrial fibrillation: Secondary | ICD-10-CM

## 2019-09-19 LAB — CBC WITH DIFFERENTIAL/PLATELET
Abs Immature Granulocytes: 0.03 10*3/uL (ref 0.00–0.07)
Basophils Absolute: 0 10*3/uL (ref 0.0–0.1)
Basophils Relative: 0 %
Eosinophils Absolute: 0.1 10*3/uL (ref 0.0–0.5)
Eosinophils Relative: 2 %
HCT: 31.9 % — ABNORMAL LOW (ref 39.0–52.0)
Hemoglobin: 11 g/dL — ABNORMAL LOW (ref 13.0–17.0)
Immature Granulocytes: 0 %
Lymphocytes Relative: 8 %
Lymphs Abs: 0.7 10*3/uL (ref 0.7–4.0)
MCH: 32.8 pg (ref 26.0–34.0)
MCHC: 34.5 g/dL (ref 30.0–36.0)
MCV: 95.2 fL (ref 80.0–100.0)
Monocytes Absolute: 0.6 10*3/uL (ref 0.1–1.0)
Monocytes Relative: 6 %
Neutro Abs: 7.4 10*3/uL (ref 1.7–7.7)
Neutrophils Relative %: 84 %
Platelets: 111 10*3/uL — ABNORMAL LOW (ref 150–400)
RBC: 3.35 MIL/uL — ABNORMAL LOW (ref 4.22–5.81)
RDW: 13.5 % (ref 11.5–15.5)
WBC: 8.8 10*3/uL (ref 4.0–10.5)
nRBC: 0 % (ref 0.0–0.2)

## 2019-09-19 LAB — COMPREHENSIVE METABOLIC PANEL
ALT: 238 U/L — ABNORMAL HIGH (ref 0–44)
AST: 205 U/L — ABNORMAL HIGH (ref 15–41)
Albumin: 3.2 g/dL — ABNORMAL LOW (ref 3.5–5.0)
Alkaline Phosphatase: 334 U/L — ABNORMAL HIGH (ref 38–126)
Anion gap: 6 (ref 5–15)
BUN: 16 mg/dL (ref 8–23)
CO2: 22 mmol/L (ref 22–32)
Calcium: 8.3 mg/dL — ABNORMAL LOW (ref 8.9–10.3)
Chloride: 104 mmol/L (ref 98–111)
Creatinine, Ser: 0.81 mg/dL (ref 0.61–1.24)
GFR calc Af Amer: >60 mL/min (ref >60)
GFR calc non Af Amer: >60 mL/min (ref >60)
Glucose, Bld: 106 mg/dL — ABNORMAL HIGH (ref 70–99)
Potassium: 4.1 mmol/L (ref 3.5–5.1)
Sodium: 132 mmol/L — ABNORMAL LOW (ref 135–145)
Total Bilirubin: 1.7 mg/dL — ABNORMAL HIGH (ref 0.3–1.2)
Total Protein: 5.9 g/dL — ABNORMAL LOW (ref 6.5–8.1)

## 2019-09-19 LAB — MAGNESIUM: Magnesium: 2 mg/dL (ref 1.7–2.4)

## 2019-09-19 LAB — URINE CULTURE

## 2019-09-19 LAB — PROTIME-INR
INR: 1.3 — ABNORMAL HIGH (ref 0.8–1.2)
Prothrombin Time: 16.1 seconds — ABNORMAL HIGH (ref 11.4–15.2)

## 2019-09-19 MED ORDER — LISINOPRIL 20 MG PO TABS
20.0000 mg | ORAL_TABLET | Freq: Two times a day (BID) | ORAL | Status: DC
  Administered 2019-09-19 – 2019-09-20 (×2): 20 mg via ORAL
  Filled 2019-09-19 (×2): qty 1

## 2019-09-19 MED ORDER — SODIUM CHLORIDE 0.9 % IV SOLN
2.0000 g | Freq: Three times a day (TID) | INTRAVENOUS | Status: DC
  Administered 2019-09-19 – 2019-09-20 (×4): 2 g via INTRAVENOUS
  Filled 2019-09-19 (×6): qty 2

## 2019-09-19 MED ORDER — SODIUM CHLORIDE (PF) 0.9 % IJ SOLN
INTRAMUSCULAR | Status: AC
  Filled 2019-09-19: qty 50

## 2019-09-19 MED ORDER — IOHEXOL 300 MG/ML  SOLN
75.0000 mL | Freq: Once | INTRAMUSCULAR | Status: AC | PRN
  Administered 2019-09-19: 15:00:00 75 mL via INTRAVENOUS

## 2019-09-19 MED ORDER — MAGNESIUM OXIDE 400 (241.3 MG) MG PO TABS
400.0000 mg | ORAL_TABLET | Freq: Every day | ORAL | Status: DC
  Administered 2019-09-19 – 2019-09-20 (×2): 400 mg via ORAL
  Filled 2019-09-19 (×2): qty 1

## 2019-09-19 NOTE — Telephone Encounter (Signed)
The pt is currently admitted in the hospital

## 2019-09-19 NOTE — Consult Note (Addendum)
[place]  [phone]) [phone] Fax:([phone]) [phone]   MEDICAL ONCOLOGY - INITIAL CONSULTATION  Referral MD: Dr. Owens Loffler  Reason for Referral: Newly diagnosed pancreatic adenocarcinoma  HPI: Mr. Delosangeles is an 84 year old male with a past medical history significant for BPH, CAD, paroxysmal atrial fibrillation, DJD, GERD, hyperlipidemia, hypertension.  The patient was found to have an incidental pancreatic mass noted on imaging when he went for follow-up for abdominal aortic aneurysm.  He had lab work performed at an outside office which was concerning for biliary obstruction.  He had an outside CT scan performed in [place] which showed marked intra and extrahepatic biliary ductal dilatation with abrupt tapering at the common bile duct at the level of the pancreatic head.  There was also pancreatic atrophy predominantly involving the body and tail of the pancreas with diffuse pancreatic ductal dilatation.  Findings were concerning for pancreatic head mass with resulting biliary obstruction versus ampullary mass.  He was referred to gastroenterology and underwent EUS and ERCP on 09/14/2019.  This showed an irregular mass in the pancreatic head.  This mass was causing pancreatic and bile duct obstruction and dilation.  Preliminary cytology reading from FNA of the mass was positive for malignancy, adenocarcinoma.  The patient had a biliary stent placed.  Just prior to admission, the patient developed fatigue, fever, chills x2 days and presented to the emergency room.  The patient had been taking Tylenol at home with some improvement.  He also developed abdominal pain and nausea.  He did not have any vomiting at home.  On admission, his WBC was 11.6, hemoglobin 12.6, platelet count 131,000.  CMET showed a sodium of 131, potassium 3.1, AST 338, ALT 329, alkaline phosphatase 136, and total bilirubin 1.8.  CT of the abdomen pelvis with contrast was performed on admission which showed  persistent dilatation of the biliary tree after stent placement - ?concern for obstruction, slight new enhancement of the wall the distended gallbladder which could represent early infection, slight enhancement of the pancreatic head near the ampulla consistent with tumor.  The patient was started on clear liquids, as needed antiemetics, as needed analgesics, and IV antibiotics.  When seen today, patient reports that he is feeling better.  No recurrent fever or chills since admission.  He does report some early satiety and bloating.  The symptoms have worsened.  He states that his appetite is fair.  He has lost some weight intentionally initially, but then lost weight without trying to.  He is trying to eat better and gained some of it back.  He denies any headaches or dizziness.  No vision changes.  He denies chest discomfort, shortness of breath, cough.  Abdominal pain has resolved.  Denies nausea, vomiting, diarrhea.  He reports baseline constipation which is well managed.  Denies lower extremity edema and bleeding.  The patient is widowed.  He had a son and a daughter, but his son is deceased.  He lives by himself but his daughter and neighbor check on him regularly.  States that he works out 6 times per week.  Denies history of alcohol tobacco use.  Medical oncology was asked see the patient to make recommendations regarding his newly diagnosed pancreatic adenocarcinoma.   Past Medical History:  Diagnosis Date  . BPH (benign prostatic hyperplasia)   . Cancer ([place])   . Coronary artery disease   . DJD (degenerative joint disease)   . GERD (gastroesophageal reflux disease)   . History of colonic polyps   .  Hypercholesteremia   . Hypertension   . Pancreatic adenocarcinoma ([place]) 09/18/2019  :  Past Surgical History:  Procedure Laterality Date  . BILIARY STENT PLACEMENT N/A 09/14/2019   Procedure: BILIARY STENT PLACEMENT;  Surgeon: Milus Banister, MD;  Location: [facility];  Service: Endoscopy;   Laterality: N/A;  . CATARACT EXTRACTION, BILATERAL  2012   In [place]  . CORONARY ATHERECTOMY N/A 09/12/2018   Procedure: CORONARY ATHERECTOMY;  Surgeon: Jettie Booze, MD;  Location: [facility];  Service: Cardiovascular;  Laterality: N/A;  mid LAD  . CORONARY BALLOON ANGIOPLASTY N/A 09/12/2018   Procedure: CORONARY BALLOON ANGIOPLASTY;  Surgeon: Jettie Booze, MD;  Location: [facility];  Service: Cardiovascular;  Laterality: N/A;  diag 1  . CORONARY STENT INTERVENTION N/A 09/12/2018   Procedure: CORONARY STENT INTERVENTION;  Surgeon: Jettie Booze, MD;  Location: [facility];  Service: Cardiovascular;  Laterality: N/A;  mid LAD  . ENDOSCOPIC RETROGRADE CHOLANGIOPANCREATOGRAPHY (ERCP) WITH PROPOFOL N/A 09/14/2019   Procedure: ENDOSCOPIC RETROGRADE CHOLANGIOPANCREATOGRAPHY (ERCP) WITH PROPOFOL;  Surgeon: Milus Banister, MD;  [facility];  Service: Endoscopy;  Laterality: N/A;  . ESOPHAGOGASTRODUODENOSCOPY (EGD) WITH PROPOFOL N/A 09/14/2019   Procedure: ESOPHAGOGASTRODUODENOSCOPY (EGD) WITH PROPOFOL;  Surgeon: Milus Banister, MD;  [facility];  Service: Endoscopy;  Laterality: N/A;  . EUS N/A 09/14/2019   Procedure: UPPER ENDOSCOPIC ULTRASOUND (EUS) RADIAL;  Surgeon: Milus Banister, MD;  [facility];  Service: Endoscopy;  Laterality: N/A;  CAT 2  . FINE NEEDLE ASPIRATION N/A 09/14/2019   Procedure: FINE NEEDLE ASPIRATION (FNA) LINEAR;  Surgeon: Milus Banister, MD;  [facility];  Service: Endoscopy;  Laterality: N/A;  . LEFT HEART CATH AND CORONARY ANGIOGRAPHY N/A 09/09/2018   Procedure: LEFT HEART CATH AND CORONARY ANGIOGRAPHY;  Surgeon: Burnell Blanks, MD;  Location: [facility];  Service: Cardiovascular;  Laterality: N/A;  . LEFT HEART CATH AND CORONARY ANGIOGRAPHY N/A 09/12/2018   Procedure: LEFT HEART CATH AND CORONARY ANGIOGRAPHY;  Surgeon: Jettie Booze, MD;  Location: [facility];  Service: Cardiovascular;  Laterality: N/A;  . MOHS SURGERY  2012   Skin Cancer removed from nose by Moh's in   . SHOULDER SURGERY    :  Current Facility-Administered Medications  Medication Dose Route Frequency Provider Last Rate Last Admin  . 0.9 % NaCl with KCl 40 mEq / L  infusion   Intravenous Continuous Reubin Milan, MD 88 mL/hr at 09/19/19 0734 88 mL/hr at 09/19/19 0734  . ceFEPIme (MAXIPIME) 2 g in sodium chloride 0.9 % 100 mL IVPB  2 g Intravenous Q8H Eugenie Filler, MD 200 mL/hr at 09/19/19 1050 2 g at 09/19/19 1050  . levothyroxine (SYNTHROID) tablet 25 mcg  25 mcg Oral QAC breakfast Reubin Milan, MD   25 mcg at 09/19/19 0502  . loratadine (CLARITIN) tablet 10 mg  10 mg Oral QHS Reubin Milan, MD      . magnesium oxide (MAG-OX) tablet 400 mg  400 mg Oral Q1200 Reubin Milan, MD   400 mg at 09/19/19 1046  . metoprolol tartrate (LOPRESSOR) tablet 25 mg  25 mg Oral BID Reubin Milan, MD   25 mg at 09/19/19 1046  . metroNIDAZOLE (FLAGYL) IVPB 500 mg  500 mg Intravenous Q8H Reubin Milan, MD 100 mL/hr at 09/19/19 0746 500 mg at 09/19/19 0746  . nitroGLYCERIN (NITROSTAT) SL tablet 0.4 mg  0.4 mg  Sublingual Q5 min PRN Reubin Milan, MD      . ondansetron [facility]) tablet 4 mg  4 mg Oral Q6H PRN Reubin Milan, MD       Or  . ondansetron [facility]) injection 4 mg  4 mg Intravenous Q6H PRN Reubin Milan, MD      . pantoprazole (PROTONIX) injection 40 mg  40 mg Intravenous Q24H Reubin Milan, MD   40 mg at 09/18/19 1651  . tamsulosin (FLOMAX) capsule 0.4 mg  0.4 mg Oral QHS Reubin Milan, MD   0.4 mg at 09/18/19 2222  . vancomycin (VANCOREADY) IVPB 1500 mg/300 mL  1,500 mg Intravenous Q24H Reubin Milan, MD 150 mL/hr at 09/19/19 0447 1,500 mg at 09/19/19 0447     No Known Allergies:  Family History  Problem Relation Age of Onset  . Heart disease Father   . Stroke Father   . Hypertension Father   .  Heart disease Mother   . Stroke Mother   . Hypertension Mother   . Atrial fibrillation Brother   . CAD Sister        Patient does not know details  :  Social History   Socioeconomic History  . Marital status: Widowed    Spouse name: [name]  . Number of children: 2  . Years of education: Not on file  . Highest education level: Not on file  Occupational History  . Not on file  Tobacco Use  . Smoking status: Never Smoker  . Smokeless tobacco: Never Used  Substance and Sexual Activity  . Alcohol use: No  . Drug use: No  . Sexual activity: Not on file  Other Topics Concern  . Not on file  Social History Narrative  . Not on file   Social Determinants of Health   Financial Resource Strain:   . Difficulty of Paying Living Expenses: Not on file  Food Insecurity:   . Worried About Charity fundraiser in the Last Year: Not on file  . Ran Out of Food in the Last Year: Not on file  Transportation Needs:   . Lack of Transportation (Medical): Not on file  . Lack of Transportation (Non-Medical): Not on file  Physical Activity:   . Days of Exercise per Week: Not on file  . Minutes of Exercise per Session: Not on file  Stress:   . Feeling of Stress : Not on file  Social Connections:   . Frequency of Communication with Friends and Family: Not on file  . Frequency of Social Gatherings with Friends and Family: Not on file  . Attends Religious Services: Not on file  . Active Member of Clubs or Organizations: Not on file  . Attends Archivist Meetings: Not on file  . Marital Status: Not on file  Intimate Partner Violence:   . Fear of Current or Ex-Partner: Not on file  . Emotionally Abused: Not on file  . Physically Abused: Not on file  . Sexually Abused: Not on file  :  Review of Systems: A comprehensive 14 point review of systems was negative except as noted in the HPI.  Exam: Patient Vitals for the past 24 hrs:  BP Temp Temp src Pulse Resp SpO2 Height Weight   09/19/19 0607 128/63 97.6 F (36.4 C) -- (!) 54 18 99 % -- --  09/18/19 2043 110/62 97.7 F (36.5 C) Oral (!) 57 18 99 % -- --  09/18/19 1632 (!) 148/79 98 F (36.7  C) Oral (!) 56 -- 100 % -- --  09/18/19 1530 (!) 150/74 -- -- (!) 57 14 99 % -- --  09/18/19 1500 (!) 141/71 -- -- (!) 55 17 97 % -- --  09/18/19 1445 -- -- -- 61 15 99 % -- --  09/18/19 1441 (!) 122/56 -- -- 66 18 99 % -- --  09/18/19 1430 (!) 122/56 -- -- -- 15 -- -- --  09/18/19 1415 -- -- -- -- (!) 22 -- -- --  09/18/19 1400 116/61 -- -- (!) 59 18 97 % -- --  09/18/19 1330 (!) 102/53 -- -- 63 18 100 % -- --  09/18/19 1204 102/67 -- -- 67 (!) 21 97 % -- --  09/18/19 1150 -- (!) 101.7 F (38.7 C) Rectal -- -- -- -- --  09/18/19 1116 121/68 98.2 F (36.8 C) Oral 83 16 98 % -- --  09/18/19 1115 -- -- -- -- -- -- 6\' 1"  (1.854 m) 170 lb (77.1 kg)    General:  well-nourished in no acute distress.   Eyes:  no scleral icterus.   ENT:  There were no oropharyngeal lesions.   Neck was without thyromegaly.   Lymphatics:  Negative cervical, supraclavicular or axillary adenopathy.   Respiratory: lungs were clear bilaterally without wheezing or crackles.   Cardiovascular:  Regular rate and rhythm, S1/S2, without murmur, rub or gallop.  There was no pedal edema.   GI:  abdomen was soft, flat, nontender, nondistended, without organomegaly.   Musculoskeletal:  no spinal tenderness of palpation of vertebral spine.   Skin exam was without echymosis, petichae.   Neuro exam was nonfocal. Patient was alert and oriented.  Attention was good.   Language was appropriate.  Mood was normal without depression.  Speech was not pressured.  Thought content was not tangential.     Lab Results  Component Value Date   WBC 8.8 09/19/2019   HGB 11.0 (L) 09/19/2019   HCT 31.9 (L) 09/19/2019   PLT 111 (L) 09/19/2019   GLUCOSE 106 (H) 09/19/2019   CHOL 158 12/30/2010   TRIG 135.0 12/30/2010   HDL 40.40 12/30/2010   LDLDIRECT 146.6 07/03/2009    LDLCALC 91 12/30/2010   ALT 238 (H) 09/19/2019   AST 205 (H) 09/19/2019   NA 132 (L) 09/19/2019   K 4.1 09/19/2019   CL 104 09/19/2019   CREATININE 0.81 09/19/2019   BUN 16 09/19/2019   CO2 22 09/19/2019    CT Abdomen Pelvis W Contrast  Result Date: 09/18/2019 CLINICAL DATA:  Painless postoperative fever. Biliary stent placement. New diagnosis of pancreatic cancer. EXAM: CT ABDOMEN AND PELVIS WITH CONTRAST TECHNIQUE: Multidetector CT imaging of the abdomen and pelvis was performed using the standard protocol following bolus administration of intravenous contrast. CONTRAST:  141mL OMNIPAQUE IOHEXOL 300 MG/ML  SOLN COMPARISON:  CT scan dated 08/25/2019 FINDINGS: Lower chest: No acute abnormalities. Aortic atherosclerosis. Coronary artery calcifications. Heart size is normal. Lung bases are clear. Hepatobiliary: Diffuse dilatation of the biliary tree including distention of the gallbladder persists after stent placement. The stent appears to be in good position. There is new slight enhancement of the wall of the distended gallbladder is compared to the prior study. There is a new small amount of fluid in the gallbladder fossa. The liver parenchyma is otherwise normal. Pancreas: There is diffuse pancreatic atrophy with chronic dilatation of the pancreatic duct. Slight enhancement of the pancreatic head near the ampulla likely represents tumor. There is no evidence of  inflammation around the pancreas. Spleen: Normal. Adrenals/Urinary Tract: Adrenal glands are unremarkable. Kidneys are normal, without renal calculi, focal lesion, or hydronephrosis. Enlargement of the median lobe of the prostate gland. The bladder otherwise appears normal. Stomach/Bowel: Stomach is within normal limits. Appendix appears normal. No evidence of bowel wall thickening, distention, or inflammatory changes. Vascular/Lymphatic: Extensive aortic atherosclerosis. No adenopathy. Reproductive: Enlarged median lobe of the prostate gland.  Other: No ascites. No free air. Musculoskeletal: No acute or significant osseous findings. IMPRESSION: 1. Persistent dilatation of the biliary tree after stent placement. The patient's bilirubin has risen. The possibility of stent obstruction should be considered. Slight new enhancement of the wall of the distended gallbladder with a new tiny amount of fluid around the distended gallbladder. This could represent early infection. 2. Slight enhancement of the pancreatic head near the ampulla consistent with tumor. 3. No evidence of inflammation around the pancreas. 4. No other acute abnormalities. 5. Extensive aortic atherosclerosis. 6. Enlarged median lobe of the prostate gland. Aortic Atherosclerosis (ICD10-I70.0). Electronically Signed   By: Lorriane Shire M.D.   On: 09/18/2019 13:10   DG Chest Portable 1 View  Result Date: 09/18/2019 CLINICAL DATA:  Persistent fever since pancreatic stent placement 4 days ago. EXAM: PORTABLE CHEST 1 VIEW COMPARISON:  Radiographs 03/16/2019 and 09/06/2018. FINDINGS: 1152 hours. The heart size and mediastinal contours are normal. The lungs are clear. There is no pleural effusion or pneumothorax. No acute osseous findings are identified. There are stable old left-sided rib fractures. Telemetry leads overlie the chest. IMPRESSION: Stable chest. No active cardiopulmonary process. Electronically Signed   By: Richardean Sale M.D.   On: 09/18/2019 12:43   DG ERCP  Result Date: 09/14/2019 CLINICAL DATA:  ERCP with stent placement. EXAM: ERCP TECHNIQUE: Multiple spot images obtained with the fluoroscopic device and submitted for interpretation post-procedure. COMPARISON:  CT abdomen pelvis-08/25/2019 FLUOROSCOPY TIME:  Not provided FINDINGS: ERCP probe overlies the right upper abdominal quadrant. There is selective cannulation opacification the common bile duct which appears moderately to markedly dilated. There is an apparent moderate length slightly irregular narrowing involving the  distal aspect of the CBD. Subsequent images demonstrate placement of a biliary stent within the mid/distal aspect of the CBD with distal end overlying expected location of duodenum. IMPRESSION: ERCP with biliary stent placement as above. These images were submitted for radiologic interpretation only. Please see the procedural report for the amount of contrast and the fluoroscopy time utilized. Electronically Signed   By: Sandi Mariscal M.D.   On: 09/14/2019 14:02     CT Abdomen Pelvis W Contrast  Result Date: 09/18/2019 CLINICAL DATA:  Painless postoperative fever. Biliary stent placement. New diagnosis of pancreatic cancer. EXAM: CT ABDOMEN AND PELVIS WITH CONTRAST TECHNIQUE: Multidetector CT imaging of the abdomen and pelvis was performed using the standard protocol following bolus administration of intravenous contrast. CONTRAST:  13mL OMNIPAQUE IOHEXOL 300 MG/ML  SOLN COMPARISON:  CT scan dated 08/25/2019 FINDINGS: Lower chest: No acute abnormalities. Aortic atherosclerosis. Coronary artery calcifications. Heart size is normal. Lung bases are clear. Hepatobiliary: Diffuse dilatation of the biliary tree including distention of the gallbladder persists after stent placement. The stent appears to be in good position. There is new slight enhancement of the wall of the distended gallbladder is compared to the prior study. There is a new small amount of fluid in the gallbladder fossa. The liver parenchyma is otherwise normal. Pancreas: There is diffuse pancreatic atrophy with chronic dilatation of the pancreatic duct. Slight enhancement of the pancreatic  head near the ampulla likely represents tumor. There is no evidence of inflammation around the pancreas. Spleen: Normal. Adrenals/Urinary Tract: Adrenal glands are unremarkable. Kidneys are normal, without renal calculi, focal lesion, or hydronephrosis. Enlargement of the median lobe of the prostate gland. The bladder otherwise appears normal. Stomach/Bowel:  Stomach is within normal limits. Appendix appears normal. No evidence of bowel wall thickening, distention, or inflammatory changes. Vascular/Lymphatic: Extensive aortic atherosclerosis. No adenopathy. Reproductive: Enlarged median lobe of the prostate gland. Other: No ascites. No free air. Musculoskeletal: No acute or significant osseous findings. IMPRESSION: 1. Persistent dilatation of the biliary tree after stent placement. The patient's bilirubin has risen. The possibility of stent obstruction should be considered. Slight new enhancement of the wall of the distended gallbladder with a new tiny amount of fluid around the distended gallbladder. This could represent early infection. 2. Slight enhancement of the pancreatic head near the ampulla consistent with tumor. 3. No evidence of inflammation around the pancreas. 4. No other acute abnormalities. 5. Extensive aortic atherosclerosis. 6. Enlarged median lobe of the prostate gland. Aortic Atherosclerosis (ICD10-I70.0). Electronically Signed   By: Lorriane Shire M.D.   On: 09/18/2019 13:10   DG Chest Portable 1 View  Result Date: 09/18/2019 CLINICAL DATA:  Persistent fever since pancreatic stent placement 4 days ago. EXAM: PORTABLE CHEST 1 VIEW COMPARISON:  Radiographs 03/16/2019 and 09/06/2018. FINDINGS: 1152 hours. The heart size and mediastinal contours are normal. The lungs are clear. There is no pleural effusion or pneumothorax. No acute osseous findings are identified. There are stable old left-sided rib fractures. Telemetry leads overlie the chest. IMPRESSION: Stable chest. No active cardiopulmonary process. Electronically Signed   By: Richardean Sale M.D.   On: 09/18/2019 12:43   DG ERCP  Result Date: 09/14/2019 CLINICAL DATA:  ERCP with stent placement. EXAM: ERCP TECHNIQUE: Multiple spot images obtained with the fluoroscopic device and submitted for interpretation post-procedure. COMPARISON:  CT abdomen pelvis-08/25/2019 FLUOROSCOPY TIME:  Not  provided FINDINGS: ERCP probe overlies the right upper abdominal quadrant. There is selective cannulation opacification the common bile duct which appears moderately to markedly dilated. There is an apparent moderate length slightly irregular narrowing involving the distal aspect of the CBD. Subsequent images demonstrate placement of a biliary stent within the mid/distal aspect of the CBD with distal end overlying expected location of duodenum. IMPRESSION: ERCP with biliary stent placement as above. These images were submitted for radiologic interpretation only. Please see the procedural report for the amount of contrast and the fluoroscopy time utilized. Electronically Signed   By: Sandi Mariscal M.D.   On: 09/14/2019 14:02    Pathology:  CYTOLOGY - NON PAP  CASE: [id number]  PATIENT: Georjean Mode  Non-Gynecological Cytology Report   Clinical History: Biliary stricture, 3 cm pancreatic lesion  Specimen Submitted: A. PANCREAS, HEAD, FINE NEEDLE ASPIRATION:   FINAL MICROSCOPIC DIAGNOSIS:  - Malignant cells consistent with adenocarcinoma   SPECIMEN ADEQUACY:  Satisfactory for evaluation   IMMEDIATE EVALUATION:  1-2) BLOOD, 3-5) ADEQUATE ([facility])   DIAGNOSTIC COMMENTS:  Dr. Saralyn Pilar has reviewed the case.   Assessment and Plan:  1.  Pancreatic carcinoma  -ERCP 09/14/2019-mid bile duct stricture, placement of a plastic biliary stent  -Biliary stent placement on 09/14/2019  -EUS 09/14/2019-irregular 30 mm x 24 mm pancreas head mass causing pancreatic and biliary duct obstruction, abuts the PV confluence, no peripancreatic adenopathy   -09/14/2019 FNA biopsy, malignant cells consistent with adenocarcinoma 2.  Atrial fibrillation -on Eliquis  3.  Mild anemia 4.  Acute cholangitis, improved 5.  Hypertension 6.  Admission with probable acute cholangitis 09/18/2019 7.  Thoracic aortic aneurysm  Mr. Jubb has a newly diagnosed pancreatic adenocarcinoma.  Status post biliary stent placement and now  admitted for acute cholangitis.  His abdominal pain, nausea, vomiting seem to be improving.  He has no longer febrile no longer having chills.  Recommendations: 1.  Obtain CT of the chest with contrast to complete his staging work-up. 2.  We will hold off on obtaining CA 19.9 given recent biliary stent placement. 3.  The patient has has a reasonable performance status and expresses interest in trying systemic treatment.  Further recommendations per Dr. Benay Spice. 4.  Eliquis was placed on hold due to abnormal LFTs and possible procedure.  Recommend resuming if no procedures planned.  Thank you for this referral.   Mikey Bussing, DNP, AGPCNP-BC, AOCNP  Mr. Meece was interviewed and examined.  I reviewed the CT images.  He has been diagnosed with adenocarcinoma the pancreas after presenting with abnormal liver enzymes.  He underwent ERCP and placement of a bile duct stent.  He was admitted 09/18/2019 with probable cholangitis.  He appears well at present.  He does not have significant symptoms related to pancreas cancer.  I discussed the diagnosis of pancreas cancer and treatment options with Mr. Haltiwanger.  He appears to have borderline resectable disease based on the evaluation today.  He understands surgery is the only potentially curative therapy.  I will present his case at the GI tumor conference on 09/27/2019.  He will return for an office visit on 09/29/2019.  We will consider neoadjuvant chemotherapy plus/minus radiation if he is felt to be a surgical candidate.

## 2019-09-19 NOTE — Plan of Care (Signed)
  Problem: Education: Goal: Knowledge of General Education information will improve Description: Including pain rating scale, medication(s)/side effects and non-pharmacologic comfort measures Outcome: Progressing   Problem: Clinical Measurements: Goal: Ability to maintain clinical measurements within normal limits will improve Outcome: Progressing   Problem: Education: Goal: Knowledge of General Education information will improve Description: Including pain rating scale, medication(s)/side effects and non-pharmacologic comfort measures Outcome: Progressing

## 2019-09-19 NOTE — Progress Notes (Signed)
PROGRESS NOTE    Ronald Bird  [id number] DOB: 07/14/1936 DOA: 09/18/2019 PCP: Venetia Maxon, Sharon Mt, MD    Brief Narrative:  HPI per Dr. Heidi Dach is a 84 y.o. male with medical history significant of BPH, CAD, paroxysmal atrial fibrillation, DJD, GERD, colon polyps, hyperlipidemia, hypertension, recently diagnosed with pancreatic cancer who underwent a biliary stent placement on 09/14/2019 and is returning to the emergency department due to fatigue, malaise for the past 2 days associated with fever since yesterday.  He also stated that he had a frontal headache this morning when he had a fever.  He has been taking Tylenol at home with some improvement.  He complains of mild abdominal pain and mild nausea, but denies emesis, diarrhea or constipation.  He also mentions having acholic stools, which now have a light brown color.  He denies chest pain, palpitations, dyspnea, PND, orthopnea or pitting edema of the lower extremities.  He also denies dysuria, frequency or hematuria, but has nocturia once or twice a night.  No polyuria, polydipsia, polyphagia or blurred vision.  ED Course: Initial vital signs temperature 98.2 F, but shortly after that increased to 101.7 F.  His pulse was 83, respirations 16, blood pressure 121/68 mmHg and O2 sat 98% on room air.  The patient received 2000 mL of NS bolus, Zofran 4 mg IVP, ibuprofen 400 mg p.o. x1, cefepime, metronidazole and vancomycin.  Urinalysis was unremarkable.  SARS coronavirus 2 antigen and PCR were negative.  CBC shows a white count of 11.6, hemoglobin 12.6 g/dL and platelets 131.  PT was 16.7, INR 1.4 and APTT 36.  Lipase was 13.  Lactic acid was normal at 1.2 mmol/L.  CMP shows sodium of 131 and potassium of 3.1 mmol/L.  The rest of the electrolytes are normal when calcium is corrected to albumin.  Renal function is normal.  Glucose 114 mg/dL.  AST is 338, ALT 329 and alkaline phosphatase 136 units/L.  Alkaline phosphatase was  1.8 mg/dL.  Assessment & Plan:   Principal Problem:   Acute cholangitis Active Problems:   HYPERCHOLESTEROLEMIA   Essential hypertension   Paroxysmal atrial fibrillation (HCC)   Coronary artery disease   Hypokalemia   Hypomagnesemia   Pancreatic adenocarcinoma ([place])  1 biliary sepsis/acute cholangitis Patient with recently diagnosed pancreatic cancer status post biliary stent placement 09/14/2019 presented to ED with fatigue, malaise x2 days with associated fever.  Patient noted in the ED to have a temperature of 101.7.  SARS coronavirus 2 antigen PCR was negative.  Patient with a leukocytosis with a white count of 11.6.  CT abdomen and pelvis which was done showed persistent dilatation of the biliary tree after stent placement.  Patient's bilirubin risen.  Possibility of stent obstruction should be considered.  Concern for early infection.  Slight enhancement of pancreatic head near the ampulla consistent with tumor.  Blood cultures ordered and are pending.  LFTs slowly trending back down.  Continue empiric IV cefepime, IV vancomycin.  GI consulted.  2.  Hypokalemia/hypomagnesemia Repleted.  Continue oral magnesium oxide.  3.  Hyperlipidemia Continue to hold statin with elevated LFTs.  4.  Recently diagnosed pancreatic adenocarcinoma Oncology informed of admission.  Patient will be seen by oncology on 09/20/2019.  5.  Paroxysmal atrial fibrillation Currently rate controlled on Lopressor.  Continue to hold Eliquis in case patient needs a procedure.  GI to advise when Eliquis may be resumed.  6.  Hypothyroidism Continue home dose Synthroid.  7.  Hypertension Continue to  hold HCTZ.  Continue Lopressor.  Home regimen ACE inhibitor.  8.  Coronary artery disease Stable.  Hold statin, diuretic.  Continue Lopressor.  Resume ACE inhibitor.  Hold Plavix in case patient needs a procedure.  GI to advise when Plavix and Eliquis may be resumed.   DVT prophylaxis: SCDs Code Status:  Full Family Communication: Updated patient.  No family at bedside. Disposition Plan:  . Patient came from: Home            . Anticipated d/c place: Home . Barriers to d/c OR conditions which need to be met to effect a safe d/c: Clinical improvement, afebrile, when cleared by gastroenterology and oncology.   Consultants:   Gastroenterology:  Oncology  Procedures  CT abdomen pelvis 09/18/2019  Antimicrobials:   IV cefepime 09/18/2019  IV vancomycin 09/18/2019   Subjective: Patient sitting up on the side of the bed eating his lunch.  States he feels much better than admission.  Denies any chest pain, no shortness of breath, no abdominal pain.  Tolerating diet.  Objective: Vitals:   09/18/19 1530 09/18/19 1632 09/18/19 2043 09/19/19 0607  BP: (!) 150/74 (!) 148/79 110/62 128/63  Pulse: (!) 57 (!) 56 (!) 57 (!) 54  Resp: 14  18 18  Temp:  98 F (36.7 C) 97.7 F (36.5 C) 97.6 F (36.4 C)  TempSrc:  Oral Oral   SpO2: 99% 100% 99% 99%  Weight:      Height:        Intake/Output Summary (Last 24 hours) at 09/19/2019 1243 Last data filed at 09/19/2019 0200 Gross per 24 hour  Intake 3109.81 ml  Output 200 ml  Net 2909.81 ml   Filed Weights   09/18/19 1115  Weight: 77.1 kg    Examination:  General exam: Appears calm and comfortable  Respiratory system: Clear to auscultation. Respiratory effort normal. Cardiovascular system: S1 & S2 heard, RRR. No JVD, murmurs, rubs, gallops or clicks. No pedal edema. Gastrointestinal system: Abdomen is nondistended, soft and nontender. No organomegaly or masses felt. Normal bowel sounds heard. Central nervous system: Alert and oriented. No focal neurological deficits. Extremities: Symmetric 5 x 5 power. Skin: No rashes, lesions or ulcers Psychiatry: Judgement and insight appear normal. Mood & affect appropriate.     Data Reviewed: I have personally reviewed following labs and imaging studies  CBC: Recent Labs  Lab 09/14/19 0910  09/18/19 1201 09/19/19 0315  WBC 5.5 11.6* 8.8  NEUTROABS  --  10.6* 7.4  HGB 13.2 12.6* 11.0*  HCT 37.6* 36.8* 31.9*  MCV 94.7 94.6 95.2  PLT 141* 131* 111*   Basic Metabolic Panel: Recent Labs  Lab 09/14/19 0910 09/18/19 1201 09/18/19 1641 09/19/19 0315  NA 134* 131*  --  132*  K 3.5 3.1*  --  4.1  CL 101 99  --  104  CO2 24 23  --  22  GLUCOSE 109* 114*  --  106*  BUN 14 20  --  16  CREATININE 0.85 1.00  --  0.81  CALCIUM 9.0 8.8*  --  8.3*  MG  --   --  1.6* 2.0  PHOS  --   --  3.8  --    GFR: Estimated Creatinine Clearance: 78 mL/min (by C-G formula based on SCr of 0.81 mg/dL). Liver Function Tests: Recent Labs  Lab 09/14/19 0910 09/18/19 1201 09/19/19 0315  AST 258* 338* 205*  ALT 267* 329* 238*  ALKPHOS 362* 436* 334*  BILITOT 1.3* 1.8* 1.7*    PROT 7.2 7.0 5.9*  ALBUMIN 3.9 3.8 3.2*   Recent Labs  Lab 09/18/19 1201  LIPASE 13   No results for input(s): AMMONIA in the last 168 hours. Coagulation Profile: Recent Labs  Lab 09/14/19 0910 09/18/19 1201 09/19/19 0315  INR 1.0 1.4* 1.3*   Cardiac Enzymes: No results for input(s): CKTOTAL, CKMB, CKMBINDEX, TROPONINI in the last 168 hours. BNP (last 3 results) No results for input(s): PROBNP in the last 8760 hours. HbA1C: No results for input(s): HGBA1C in the last 72 hours. CBG: Recent Labs  Lab 09/18/19 1134  GLUCAP 107*   Lipid Profile: No results for input(s): CHOL, HDL, LDLCALC, TRIG, CHOLHDL, LDLDIRECT in the last 72 hours. Thyroid Function Tests: No results for input(s): TSH, T4TOTAL, FREET4, T3FREE, THYROIDAB in the last 72 hours. Anemia Panel: No results for input(s): VITAMINB12, FOLATE, FERRITIN, TIBC, IRON, RETICCTPCT in the last 72 hours. Sepsis Labs: Recent Labs  Lab 09/18/19 1201  LATICACIDVEN 1.2    Recent Results (from the past 240 hour(s))  SARS CORONAVIRUS 2 (TAT 6-24 HRS) Nasopharyngeal Nasopharyngeal Swab     Status: None   Collection Time: 09/11/19 10:32 AM    Specimen: Nasopharyngeal Swab  Result Value Ref Range Status   SARS Coronavirus 2 NEGATIVE NEGATIVE Final    Comment: (NOTE) SARS-CoV-2 target nucleic acids are NOT DETECTED. The SARS-CoV-2 RNA is generally detectable in upper and lower respiratory specimens during the acute phase of infection. Negative results do not preclude SARS-CoV-2 infection, do not rule out co-infections with other pathogens, and should not be used as the sole basis for treatment or other patient management decisions. Negative results must be combined with clinical observations, patient history, and epidemiological information. The expected result is Negative. Fact Sheet for Patients: SugarRoll.be Fact Sheet for Healthcare Providers: https://www.woods-mathews.com/ This test is not yet approved or cleared by the [place] FDA and  has been authorized for detection and/or diagnosis of SARS-CoV-2 by FDA under an Emergency Use Authorization (EUA). This EUA will remain  in effect (meaning this test can be used) for the duration of the COVID-19 declaration under Section 56 4(b)(1) of the Act, 21 U.S.C. section 360bbb-3(b)(1), unless the authorization is terminated or revoked sooner. Performed at [facility], [place] [address] [place], [place] [address]   Urine culture     Status: Abnormal   Collection Time: 09/18/19 11:50 AM   Specimen: Urine, Clean Catch  Result Value Ref Range Status   Specimen Description   Final    URINE, CLEAN CATCH Performed at [facility], [place] [address], [place], [place] [address]    Special Requests   Final    NONE Performed at [facility], [place] [address]  [place], [place] [address]    Culture MULTIPLE SPECIES PRESENT, SUGGEST RECOLLECTION (A)  Final   Report Status 09/19/2019 FINAL  Final  Blood culture (routine x 2)     Status: None (Preliminary result)   Collection Time: 09/18/19 12:01 PM    Specimen: BLOOD  Result Value Ref Range Status   Specimen Description   Final    BLOOD LEFT ANTECUBITAL Performed at [facility] [address], [place], [place] [address]    Special Requests   Final    BOTTLES DRAWN AEROBIC AND ANAEROBIC Blood Culture adequate volume Performed at [facility] [address] [place], [place] [address]    Culture   Final    NO GROWTH < 24 HOURS Performed at  [facility], [place] [address], [place], [place] [address]    Report Status PENDING  Incomplete  Respiratory Panel by RT PCR (Flu A&B, Covid) - Nasopharyngeal Swab     Status: None   Collection Time: 09/18/19 12:54 PM   Specimen: Nasopharyngeal Swab  Result Value Ref Range Status   SARS Coronavirus 2 by RT PCR NEGATIVE NEGATIVE Final    Comment: (NOTE) SARS-CoV-2 target nucleic acids are NOT DETECTED. The SARS-CoV-2 RNA is generally detectable in upper respiratoy specimens during the acute phase of infection. The lowest concentration of SARS-CoV-2 viral copies this assay can detect is 131 copies/mL. A negative result does not preclude SARS-Cov-2 infection and should not be used as the sole basis for treatment or other patient management decisions. A negative result may occur with  improper specimen collection/handling, submission of specimen other than nasopharyngeal swab, presence of viral mutation(s) within the areas targeted by this assay, and inadequate number of viral copies (<131 copies/mL). A negative result must be combined with clinical observations, patient history, and epidemiological information. The expected result is Negative. Fact Sheet for Patients:  PinkCheek.be Fact Sheet for Healthcare Providers:  GravelBags.it This test is not yet ap proved or cleared by the [place] FDA and  has been authorized for detection and/or diagnosis of SARS-CoV-2 by FDA under an Emergency  Use Authorization (EUA). This EUA will remain  in effect (meaning this test can be used) for the duration of the COVID-19 declaration under Section 564(b)(1) of the Act, 21 U.S.C. section 360bbb-3(b)(1), unless the authorization is terminated or revoked sooner.    Influenza A by PCR NEGATIVE NEGATIVE Final   Influenza B by PCR NEGATIVE NEGATIVE Final    Comment: (NOTE) The Xpert Xpress SARS-CoV-2/FLU/RSV assay is intended as an aid in  the diagnosis of influenza from Nasopharyngeal swab specimens and  should not be used as a sole basis for treatment. Nasal washings and  aspirates are unacceptable for Xpert Xpress SARS-CoV-2/FLU/RSV  testing. Fact Sheet for Patients: PinkCheek.be Fact Sheet for Healthcare Providers: GravelBags.it This test is not yet approved or cleared by the [place] FDA and  has been authorized for detection and/or diagnosis of SARS-CoV-2 by  FDA under an Emergency Use Authorization (EUA). This EUA will remain  in effect (meaning this test can be used) for the duration of the  Covid-19 declaration under Section 564(b)(1) of the Act, 21  U.S.C. section 360bbb-3(b)(1), unless the authorization is  terminated or revoked. Performed at [facility], [place] [address] [place], [place] [address]   Blood culture (routine x 2)     Status: None (Preliminary result)   Collection Time: 09/18/19  4:41 PM   Specimen: BLOOD  Result Value Ref Range Status   Specimen Description   Final    BLOOD RIGHT ANTECUBITAL Performed at [facility] [address] [place], [place] [address]    Special Requests   Final    BOTTLES DRAWN AEROBIC AND ANAEROBIC Blood Culture adequate volume Performed at [facility] [address], [place], [place] [address]    Culture   Final    NO GROWTH < 24 HOURS Performed at [facility] [address], [place], [place]  [address]    Report Status PENDING  Incomplete         Radiology Studies: CT Abdomen Pelvis W Contrast  Result Date: 09/18/2019 CLINICAL DATA:  Painless postoperative fever. Biliary stent placement. New diagnosis of pancreatic cancer.  EXAM: CT ABDOMEN AND PELVIS WITH CONTRAST TECHNIQUE: Multidetector CT imaging of the abdomen and pelvis was performed using the standard protocol following bolus administration of intravenous contrast. CONTRAST:  100mL OMNIPAQUE IOHEXOL 300 MG/ML  SOLN COMPARISON:  CT scan dated 08/25/2019 FINDINGS: Lower chest: No acute abnormalities. Aortic atherosclerosis. Coronary artery calcifications. Heart size is normal. Lung bases are clear. Hepatobiliary: Diffuse dilatation of the biliary tree including distention of the gallbladder persists after stent placement. The stent appears to be in good position. There is new slight enhancement of the wall of the distended gallbladder is compared to the prior study. There is a new small amount of fluid in the gallbladder fossa. The liver parenchyma is otherwise normal. Pancreas: There is diffuse pancreatic atrophy with chronic dilatation of the pancreatic duct. Slight enhancement of the pancreatic head near the ampulla likely represents tumor. There is no evidence of inflammation around the pancreas. Spleen: Normal. Adrenals/Urinary Tract: Adrenal glands are unremarkable. Kidneys are normal, without renal calculi, focal lesion, or hydronephrosis. Enlargement of the median lobe of the prostate gland. The bladder otherwise appears normal. Stomach/Bowel: Stomach is within normal limits. Appendix appears normal. No evidence of bowel wall thickening, distention, or inflammatory changes. Vascular/Lymphatic: Extensive aortic atherosclerosis. No adenopathy. Reproductive: Enlarged median lobe of the prostate gland. Other: No ascites. No free air. Musculoskeletal: No acute or significant osseous findings. IMPRESSION: 1. Persistent dilatation of the  biliary tree after stent placement. The patient's bilirubin has risen. The possibility of stent obstruction should be considered. Slight new enhancement of the wall of the distended gallbladder with a new tiny amount of fluid around the distended gallbladder. This could represent early infection. 2. Slight enhancement of the pancreatic head near the ampulla consistent with tumor. 3. No evidence of inflammation around the pancreas. 4. No other acute abnormalities. 5. Extensive aortic atherosclerosis. 6. Enlarged median lobe of the prostate gland. Aortic Atherosclerosis (ICD10-I70.0). Electronically Signed   By: James  Maxwell M.D.   On: 09/18/2019 13:10   DG Chest Portable 1 View  Result Date: 09/18/2019 CLINICAL DATA:  Persistent fever since pancreatic stent placement 4 days ago. EXAM: PORTABLE CHEST 1 VIEW COMPARISON:  Radiographs 03/16/2019 and 09/06/2018. FINDINGS: 1152 hours. The heart size and mediastinal contours are normal. The lungs are clear. There is no pleural effusion or pneumothorax. No acute osseous findings are identified. There are stable old left-sided rib fractures. Telemetry leads overlie the chest. IMPRESSION: Stable chest. No active cardiopulmonary process. Electronically Signed   By: William  Veazey M.D.   On: 09/18/2019 12:43        Scheduled Meds: . levothyroxine  25 mcg Oral QAC breakfast  . loratadine  10 mg Oral QHS  . magnesium oxide  400 mg Oral Q1200  . metoprolol tartrate  25 mg Oral BID  . pantoprazole (PROTONIX) IV  40 mg Intravenous Q24H  . tamsulosin  0.4 mg Oral QHS   Continuous Infusions: . 0.9 % NaCl with KCl 40 mEq / L 88 mL/hr (09/19/19 0734)  . ceFEPime (MAXIPIME) IV 2 g (09/19/19 1050)  . metronidazole 500 mg (09/19/19 0746)  . vancomycin 1,500 mg (09/19/19 0447)     LOS: 1 day    Time spent: 35 minutes     , MD [facility]   To contact the attending provider between 7A-7P or the covering provider during after hours  7P-7A, please log into the web site [email or web address] and access using universal  password for that web site. If you do not have   the password, please call the hospital operator.  09/19/2019, 12:43 PM

## 2019-09-19 NOTE — Progress Notes (Addendum)
Spoke with daughter Keelen Nilo and updated her on pt NURSING plan of care. Dtg concerned about planned discharged tomorrow, dtg is couple hours away and need early advance notice make arrangements to pick up patient. Dtg is only means pt has for discharge.  SRP, RN

## 2019-09-19 NOTE — Progress Notes (Addendum)
Progress Note   Subjective  Chief Complaint: Fever and chills, status post biliary stent placement 09/14/2019, history of pancreatic adenocarcinoma   Today, the patient tells me that he feels much better, he is "as fit as a fiddle".  Explains that he had a pretty good solid food breakfast this morning with no nausea or vomiting and his abdominal pain is much improved.  Tells me that Dr. Benay Spice plans to see him while he is in the hospital late tomorrow morning.  Explains that he will be here as long as he needs to be but is happy to go home tomorrow afternoon if that is possible.   Objective   Vital signs in last 24 hours: Temp:  [97.6 F (36.4 C)-101.7 F (38.7 C)] 97.6 F (36.4 C) (02/09 0607) Pulse Rate:  [54-83] 54 (02/09 0607) Resp:  [14-22] 18 (02/09 0607) BP: (102-150)/(53-79) 128/63 (02/09 0607) SpO2:  [97 %-100 %] 99 % (02/09 0607) Weight:  [77.1 kg] 77.1 kg (02/08 1115) Last BM Date: 09/18/19 General:    Elderly white male in NAD Heart:  Regular rate and rhythm; no murmurs Lungs: Respirations even and unlabored, lungs CTA bilaterally Abdomen:  Soft, nontender and nondistended. Normal bowel sounds. Extremities:  Without edema. Neurologic:  Alert and oriented,  grossly normal neurologically. Psych:  Cooperative. Normal mood and affect.  Intake/Output from previous day: 02/08 0701 - 02/09 0700 In: 3109.8 [I.V.:509.8; IV Piggyback:2600] Out: 200 [Urine:200]  Lab Results: Recent Labs    09/18/19 1201 09/19/19 0315  WBC 11.6* 8.8  HGB 12.6* 11.0*  HCT 36.8* 31.9*  PLT 131* 111*   BMET Recent Labs    09/18/19 1201 09/19/19 0315  NA 131* 132*  K 3.1* 4.1  CL 99 104  CO2 23 22  GLUCOSE 114* 106*  BUN 20 16  CREATININE 1.00 0.81  CALCIUM 8.8* 8.3*   Hepatic Function Latest Ref Rng & Units 09/19/2019 09/18/2019 09/14/2019  Total Protein 6.5 - 8.1 g/dL 5.9(L) 7.0 7.2  Albumin 3.5 - 5.0 g/dL 3.2(L) 3.8 3.9  AST 15 - 41 U/L 205(H) 338(H) 258(H)  ALT 0 - 44 U/L  238(H) 329(H) 267(H)  Alk Phosphatase 38 - 126 U/L 334(H) 436(H) 362(H)  Total Bilirubin 0.3 - 1.2 mg/dL 1.7(H) 1.8(H) 1.3(H)  Bilirubin, Direct 0.0 - 0.3 mg/dL - - -    PT/INR Recent Labs    09/18/19 1201 09/19/19 0315  LABPROT 16.7* 16.1*  INR 1.4* 1.3*    Studies/Results: CT Abdomen Pelvis W Contrast  Result Date: 09/18/2019 CLINICAL DATA:  Painless postoperative fever. Biliary stent placement. New diagnosis of pancreatic cancer. EXAM: CT ABDOMEN AND PELVIS WITH CONTRAST TECHNIQUE: Multidetector CT imaging of the abdomen and pelvis was performed using the standard protocol following bolus administration of intravenous contrast. CONTRAST:  148m OMNIPAQUE IOHEXOL 300 MG/ML  SOLN COMPARISON:  CT scan dated 08/25/2019 FINDINGS: Lower chest: No acute abnormalities. Aortic atherosclerosis. Coronary artery calcifications. Heart size is normal. Lung bases are clear. Hepatobiliary: Diffuse dilatation of the biliary tree including distention of the gallbladder persists after stent placement. The stent appears to be in good position. There is new slight enhancement of the wall of the distended gallbladder is compared to the prior study. There is a new small amount of fluid in the gallbladder fossa. The liver parenchyma is otherwise normal. Pancreas: There is diffuse pancreatic atrophy with chronic dilatation of the pancreatic duct. Slight enhancement of the pancreatic head near the ampulla likely represents tumor. There is no evidence  of inflammation around the pancreas. Spleen: Normal. Adrenals/Urinary Tract: Adrenal glands are unremarkable. Kidneys are normal, without renal calculi, focal lesion, or hydronephrosis. Enlargement of the median lobe of the prostate gland. The bladder otherwise appears normal. Stomach/Bowel: Stomach is within normal limits. Appendix appears normal. No evidence of bowel wall thickening, distention, or inflammatory changes. Vascular/Lymphatic: Extensive aortic atherosclerosis.  No adenopathy. Reproductive: Enlarged median lobe of the prostate gland. Other: No ascites. No free air. Musculoskeletal: No acute or significant osseous findings. IMPRESSION: 1. Persistent dilatation of the biliary tree after stent placement. The patient's bilirubin has risen. The possibility of stent obstruction should be considered. Slight new enhancement of the wall of the distended gallbladder with a new tiny amount of fluid around the distended gallbladder. This could represent early infection. 2. Slight enhancement of the pancreatic head near the ampulla consistent with tumor. 3. No evidence of inflammation around the pancreas. 4. No other acute abnormalities. 5. Extensive aortic atherosclerosis. 6. Enlarged median lobe of the prostate gland. Aortic Atherosclerosis (ICD10-I70.0). Electronically Signed   By: Lorriane Shire M.D.   On: 09/18/2019 13:10   DG Chest Portable 1 View  Result Date: 09/18/2019 CLINICAL DATA:  Persistent fever since pancreatic stent placement 4 days ago. EXAM: PORTABLE CHEST 1 VIEW COMPARISON:  Radiographs 03/16/2019 and 09/06/2018. FINDINGS: 1152 hours. The heart size and mediastinal contours are normal. The lungs are clear. There is no pleural effusion or pneumothorax. No acute osseous findings are identified. There are stable old left-sided rib fractures. Telemetry leads overlie the chest. IMPRESSION: Stable chest. No active cardiopulmonary process. Electronically Signed   By: Richardean Sale M.D.   On: 09/18/2019 12:43     Assessment / Plan:   Assessment: 1.  Fever/chills status post ERCP/EUS on 09/14/2019: LFTs initially increasing, now decreasing back down again, leukocytosis improved overnight with antibiotics; there is no suspicious for biliary sepsis versus acute cholecystitis, blood cultures pending, INR improving 2.  Pancreatic adenocarcinoma 3.  A. fib/CAD: On Eliquis and Plavix  Plan: 1.  Continue broad-spectrum antibiotics 2.  Please await any further  recommendations from Dr. Loletha Carrow later today  Thank you for your kind consultation, we will continue to follow.   LOS: 1 day   Levin Erp  09/19/2019, 9:52 AM  I have discussed the case with the PA, and that is the plan I formulated. I personally interviewed and examined the patient.  He says he no longer has chills or feels feverish.  Denies chest pain or dyspnea, cough or dysuria.  His biliary sepsis is steadily improving with resolution of fever, leukocytosis, and improvement in LFTs.  I believe his plastic biliary stent is patent.  Further pancreatic cancer staging scans were done today, he has been seen by oncology, and I feel he will be ready for discharge tomorrow as long as he does not show any signs of worsening infection.  Blood cultures are negative thus far, urine culture looks probably contaminated.  I discontinued his metronidazole.  Recommend total 10-day course of antibiotics, Augmentin should cover it.  Total time 25 minutes  Nelida Meuse III Office: [phone]

## 2019-09-19 NOTE — Telephone Encounter (Signed)
-----   Message from Park Liter, MD sent at 09/19/2019 12:36 PM EST ----- Eliquis can be held 24-48h before procedure  ----- Message ----- From: [name], RN Sent: 08/29/2019   2:14 PM EST To: Park Liter, MD

## 2019-09-20 ENCOUNTER — Ambulatory Visit (HOSPITAL_COMMUNITY): Admission: RE | Admit: 2019-09-20 | Payer: Medicare Other | Source: Ambulatory Visit

## 2019-09-20 LAB — COMPREHENSIVE METABOLIC PANEL
ALT: 183 U/L — ABNORMAL HIGH (ref 0–44)
AST: 134 U/L — ABNORMAL HIGH (ref 15–41)
Albumin: 3 g/dL — ABNORMAL LOW (ref 3.5–5.0)
Alkaline Phosphatase: 326 U/L — ABNORMAL HIGH (ref 38–126)
Anion gap: 5 (ref 5–15)
BUN: 13 mg/dL (ref 8–23)
CO2: 22 mmol/L (ref 22–32)
Calcium: 8.2 mg/dL — ABNORMAL LOW (ref 8.9–10.3)
Chloride: 107 mmol/L (ref 98–111)
Creatinine, Ser: 1.01 mg/dL (ref 0.61–1.24)
GFR calc Af Amer: >60 mL/min (ref >60)
GFR calc non Af Amer: >60 mL/min (ref >60)
Glucose, Bld: 117 mg/dL — ABNORMAL HIGH (ref 70–99)
Potassium: 3.9 mmol/L (ref 3.5–5.1)
Sodium: 134 mmol/L — ABNORMAL LOW (ref 135–145)
Total Bilirubin: 1 mg/dL (ref 0.3–1.2)
Total Protein: 5.7 g/dL — ABNORMAL LOW (ref 6.5–8.1)

## 2019-09-20 LAB — CBC WITH DIFFERENTIAL/PLATELET
Abs Immature Granulocytes: 0.01 10*3/uL (ref 0.00–0.07)
Basophils Absolute: 0 10*3/uL (ref 0.0–0.1)
Basophils Relative: 0 %
Eosinophils Absolute: 0.2 10*3/uL (ref 0.0–0.5)
Eosinophils Relative: 3 %
HCT: 32.4 % — ABNORMAL LOW (ref 39.0–52.0)
Hemoglobin: 11 g/dL — ABNORMAL LOW (ref 13.0–17.0)
Immature Granulocytes: 0 %
Lymphocytes Relative: 14 %
Lymphs Abs: 0.8 10*3/uL (ref 0.7–4.0)
MCH: 32.7 pg (ref 26.0–34.0)
MCHC: 34 g/dL (ref 30.0–36.0)
MCV: 96.4 fL (ref 80.0–100.0)
Monocytes Absolute: 0.6 10*3/uL (ref 0.1–1.0)
Monocytes Relative: 11 %
Neutro Abs: 4 10*3/uL (ref 1.7–7.7)
Neutrophils Relative %: 72 %
Platelets: 118 10*3/uL — ABNORMAL LOW (ref 150–400)
RBC: 3.36 MIL/uL — ABNORMAL LOW (ref 4.22–5.81)
RDW: 13.6 % (ref 11.5–15.5)
WBC: 5.6 10*3/uL (ref 4.0–10.5)
nRBC: 0 % (ref 0.0–0.2)

## 2019-09-20 MED ORDER — ONDANSETRON HCL 4 MG PO TABS: 4.0000 mg | ORAL_TABLET | Freq: Four times a day (QID) | ORAL | 0 refills | Status: AC | PRN

## 2019-09-20 MED ORDER — DIPHENHYDRAMINE HCL 25 MG PO CAPS
25.0000 mg | ORAL_CAPSULE | Freq: Four times a day (QID) | ORAL | Status: DC | PRN
  Administered 2019-09-20: 08:00:00 25 mg via ORAL
  Filled 2019-09-20: qty 1

## 2019-09-20 MED ORDER — AMOXICILLIN-POT CLAVULANATE 875-125 MG PO TABS: 1.0000 | ORAL_TABLET | Freq: Two times a day (BID) | ORAL | 0 refills | Status: DC

## 2019-09-20 MED ORDER — POLYVINYL ALCOHOL 1.4 % OP SOLN: 1.0000 [drp] | OPHTHALMIC | 0 refills | Status: AC | PRN

## 2019-09-20 MED ORDER — PANTOPRAZOLE SODIUM 40 MG PO TBEC: 40.0000 mg | DELAYED_RELEASE_TABLET | ORAL | Status: DC

## 2019-09-20 MED ORDER — POLYVINYL ALCOHOL 1.4 % OP SOLN
1.0000 [drp] | OPHTHALMIC | Status: DC | PRN
  Administered 2019-09-20: 08:00:00 1 [drp] via OPHTHALMIC
  Filled 2019-09-20: qty 15

## 2019-09-20 NOTE — TOC Progression Note (Signed)
Transition of Care [facility]) - Progression Note    Patient Details  Name: Ronald Bird MRN: [id number] Date of Birth: 1935/09/06  [facility]) CM/SW Contact  Purcell Mouton, RN Phone Number: 09/20/2019, 11:20 AM  Clinical Narrative:    Pt discharging home with no needs. Family will not be able to pick pt up until 1:00 PM.        Expected Discharge Plan and Services                                                 Social Determinants of Health (SDOH) Interventions    Readmission Risk Interventions No flowsheet data found.

## 2019-09-20 NOTE — Progress Notes (Signed)
PHARMACIST - PHYSICIAN COMMUNICATION  DR:   Rodena Piety  CONCERNING: IV to Oral Route Change Policy  RECOMMENDATION: This patient is receiving pantoprazole by the intravenous route.  Based on criteria approved by the Pharmacy and Therapeutics Committee, the intravenous medication(s) is/are being converted to the equivalent oral dose form(s).   DESCRIPTION: These criteria include:  The patient is eating (either orally or via tube) and/or has been taking other orally administered medications for a least 24 hours  The patient has no evidence of active gastrointestinal bleeding or impaired GI absorption (gastrectomy, short bowel, patient on TNA or NPO).  If you have questions about this conversion, please contact the [place] [place] 09/20/2019 9:04 AM

## 2019-09-20 NOTE — Progress Notes (Signed)
[facility] Progress Note  Chief Complaint: Nonobstructing a sending cholangitis  History:  Mr. Ronald Bird is feeling well today, says he has no abdominal pain, no chills and no documented fever.  He was seen by oncology, had some additional staging testing while admitted, and his case will be discussed at cancer conference soon.  He is optimistic about his treatment, and is eagerly awaiting his discharge later today.  ROS: Cardiovascular: No chest pain Respiratory: No dyspnea Urinary: No dysuria  Objective:   Current Facility-Administered Medications:  .  ceFEPIme (MAXIPIME) 2 g in sodium chloride 0.9 % 100 mL IVPB, 2 g, Intravenous, Q8H, Eugenie Filler, MD, Last Rate: 200 mL/hr at 09/20/19 0114, 2 g at 09/20/19 0114 .  diphenhydrAMINE (BENADRYL) capsule 25 mg, 25 mg, Oral, Q6H PRN, Georgette Shell, MD, 25 mg at 09/20/19 [id number] .  levothyroxine (SYNTHROID) tablet 25 mcg, 25 mcg, Oral, QAC breakfast, Reubin Milan, MD, 25 mcg at 09/20/19 [phone] .  lisinopril (ZESTRIL) tablet 20 mg, 20 mg, Oral, BID, Eugenie Filler, MD, 20 mg at 09/19/19 2210 .  loratadine (CLARITIN) tablet 10 mg, 10 mg, Oral, QHS, Reubin Milan, MD, 10 mg at 09/19/19 2100 .  magnesium oxide (MAG-OX) tablet 400 mg, 400 mg, Oral, Q1200, Reubin Milan, MD, 400 mg at 09/19/19 1046 .  metoprolol tartrate (LOPRESSOR) tablet 25 mg, 25 mg, Oral, BID, Reubin Milan, MD, 25 mg at 09/19/19 2100 .  nitroGLYCERIN (NITROSTAT) SL tablet 0.4 mg, 0.4 mg, Sublingual, Q5 min PRN, Reubin Milan, MD .  ondansetron [facility]) tablet 4 mg, 4 mg, Oral, Q6H PRN **OR** ondansetron (ZOFRAN) injection 4 mg, 4 mg, Intravenous, Q6H PRN, Reubin Milan, MD .  pantoprazole (PROTONIX) EC tablet 40 mg, 40 mg, Oral, Q24H, Swayne, Mary M, RPH .  polyvinyl alcohol (LIQUIFILM TEARS) 1.4 % ophthalmic solution 1 drop, 1 drop, Both Eyes, PRN, Georgette Shell, MD, 1 drop at 09/20/19 [phone] .  tamsulosin (FLOMAX) capsule  0.4 mg, 0.4 mg, Oral, QHS, Reubin Milan, MD, 0.4 mg at 09/19/19 2100 .  vancomycin (VANCOREADY) IVPB 1500 mg/300 mL, 1,500 mg, Intravenous, Q24H, Reubin Milan, MD, Last Rate: 150 mL/hr at 09/20/19 0544, 1,500 mg at 09/20/19 0544  . ceFEPime (MAXIPIME) IV 2 g (09/20/19 0114)  . vancomycin 1,500 mg (09/20/19 0544)     Vital signs in last 24 hrs: Vitals:   09/19/19 2058 09/20/19 0600  BP: (!) 166/79 (!) 160/75  Pulse: 71 62  Resp: 18   Temp: 98.2 F (36.8 C) 98.3 F (36.8 C)  SpO2: 98% 98%    Intake/Output Summary (Last 24 hours) at 09/20/2019 [id number] Last data filed at 09/20/2019 0600 Gross per 24 hour  Intake 2293.36 ml  Output 1550 ml  Net 743.36 ml     Physical Exam   HEENT: sclera anicteric, oral mucosa without lesions  Neck: supple, no thyromegaly, JVD or lymphadenopathy  Cardiac: RRR without murmurs, S1S2 heard, no peripheral edema  Pulm: clear to auscultation bilaterally, normal RR and effort noted  Abdomen: soft, no tenderness, with active bowel sounds. No guarding or palpable hepatosplenomegaly  Skin; warm and dry, no jaundice  Recent Labs:  CBC Latest Ref Rng & Units 09/20/2019 09/19/2019 09/18/2019  WBC 4.0 - 10.5 K/uL 5.6 8.8 11.6(H)  Hemoglobin 13.0 - 17.0 g/dL 11.0(L) 11.0(L) 12.6(L)  Hematocrit 39.0 - 52.0 % 32.4(L) 31.9(L) 36.8(L)  Platelets 150 - 400 K/uL 118(L) 111(L) 131(L)    Recent Labs  Lab 09/19/19  0315  INR 1.3*   CMP Latest Ref Rng & Units 09/20/2019 09/19/2019 09/18/2019  Glucose 70 - 99 mg/dL 117(H) 106(H) 114(H)  BUN 8 - 23 mg/dL 13 16 20   Creatinine 0.61 - 1.24 mg/dL 1.01 0.81 1.00  Sodium 135 - 145 mmol/L 134(L) 132(L) 131(L)  Potassium 3.5 - 5.1 mmol/L 3.9 4.1 3.1(L)  Chloride 98 - 111 mmol/L 107 104 99  CO2 22 - 32 mmol/L 22 22 23   Calcium 8.9 - 10.3 mg/dL 8.2(L) 8.3(L) 8.8(L)  Total Protein 6.5 - 8.1 g/dL 5.7(L) 5.9(L) 7.0  Total Bilirubin 0.3 - 1.2 mg/dL 1.0 1.7(H) 1.8(H)  Alkaline Phos 38 - 126 U/L 326(H) 334(H)  436(H)  AST 15 - 41 U/L 134(H) 205(H) 338(H)  ALT 0 - 44 U/L 183(H) 238(H) 329(H)   Blood cultures from admission no growth to date  @ASSESSMENTPLANBEGIN @ Assessment: Nonobstructing ascending cholangitis.  Recent placement of biliary stent by ERCP Pancreatic adenocarcinoma Elevated LFTs from underlying biliary obstruction due to malignancy and the superimposed infection.  Continues to improve clinically, his LFTs are improving, and I agree he is ready for discharge from a GI standpoint.  Plan: As noted yesterday, I recommend a total 10-day course of antibiotics, Augmentin will cover this.  Generally, for biliary infection, gram-negative and enterococcal coverage are essential, anaerobic coverage less so.  There seems to be a question about whether antiplatelet medicine and/or anticoagulant should be resumed.  There has not been GI bleeding, and is there are no plan upcoming endoscopic procedures, such medications can be restarted from a GI standpoint.   Dr. Ardis Hughs will be updated on the patient's condition, and I expect he will also hear about the patient further at cancer conference.  Total time 20 minutes  Nelida Meuse III Office: [phone]

## 2019-09-20 NOTE — Progress Notes (Signed)
Discharged to home instruction reviewed with patient acknowledged understanding. SRP, RN

## 2019-09-20 NOTE — Plan of Care (Signed)

## 2019-09-20 NOTE — Progress Notes (Signed)
Daughter to pick up pt. Pt discharge questions addressed. SRP, RN

## 2019-09-20 NOTE — Discharge Summary (Signed)
Physician Discharge Summary  Ronald Bird [id number] DOB: September 24, 1935 DOA: 09/18/2019  PCP: Emmaline Kluver, MD  Admit date: 09/18/2019 Discharge date: 09/20/2019  Admitted From: Home  disposition: Home Recommendations for Outpatient Follow-up:  1. Follow up with PCP in 1-2 weeks 2. Please obtain BMP/CBC in one week 3. Please follow up with Dr. Learta Bird oncology and Dr. Ardis Bird  Home Health: None Equipment/Devices: None Discharge Condition stable and improved CODE STATUS full code Diet recommendation: Cardiac diet Brief/Interim Summary: Ronald Tkaczyk Eubanksis a 84 y.o.malewith medical history significant ofBPH, CAD, paroxysmal atrial fibrillation, DJD, GERD, colon polyps, hyperlipidemia, hypertension, recently diagnosed with pancreatic cancer who underwent a biliary stent placement on 2/4/2021and is returning to the emergency department due to fatigue, malaise for the past 2 days associated with fever since yesterday. He also stated that he had a frontal headache this morning when he had a fever. He has been taking Tylenol at home with some improvement. He complains of mild abdominal pain and mild nausea, but denies emesis, diarrhea or constipation. He also mentions having acholic stools, which now have a light brown color.He denies chest pain, palpitations, dyspnea, PND, orthopnea or pitting edema of the lower extremities. He also denies dysuria, frequency or hematuria, but has nocturia once or twice a night. No polyuria, polydipsia, polyphagia or blurred vision.  ED Course:Initial vital signs temperature 98.2 F, but shortly after that increased to 101.7 F. His pulse was 83, respirations 16, blood pressure 121/68 mmHg and O2 sat 98% on room air. The patient received 2000 mL of NS bolus, Zofran 4 mg IVP, ibuprofen 400 mg p.o. x1, cefepime, metronidazole and vancomycin.  Urinalysis was unremarkable. SARS coronavirus 2 antigen and PCR were negative. CBC shows a white  count of 11.6, hemoglobin 12.6 g/dL and platelets 131. PT was 16.7, INR 1.4 and APTT 36. Lipase was 13. Lactic acid was normal at 1.2 mmol/L. CMP shows sodium of 131 and potassium of 3.1 mmol/L. The rest of the electrolytes are normal when calcium is corrected to albumin. Renal function is normal. Glucose 114 mg/dL. AST is 338, ALT 329 and alkaline phosphatase 136 units/L. Alkaline phosphatase was 1.8 mg/dL. Discharge Diagnoses:  Principal Problem:   Acute cholangitis Active Problems:   HYPERCHOLESTEROLEMIA   Essential hypertension   Paroxysmal atrial fibrillation (HCC)   Coronary artery disease   Hypokalemia   Hypomagnesemia   Pancreatic adenocarcinoma (HCC)   Biliary sepsis  1 biliary sepsis/acute cholangitis Patient with recently diagnosed pancreatic cancer status post biliary stent placement 09/14/2019 presented to ED with fatigue, malaise x2 days with associated fever.  Patient noted in the ED to have a temperature of 101.7.  SARS coronavirus 2 antigen PCR was negative.  Patient with a leukocytosis with a white count of 11.6.  CT abdomen and pelvis which was done showed persistent dilatation of the biliary tree after stent placement.LFTS trending down today.GI recommends 10 days of augmentin.   2.  Hypokalemia/hypomagnesemia. Repleted.   3.  Hyperlipidemia Continue to hold statin with elevated LFTs.  4.  Recently diagnosed pancreatic adenocarcinoma-patient seen by Dr. Benay Bird today.  He will make a follow-up appointment with him for next week.  5.  Paroxysmal atrial fibrillation-continue Lopressor Eliquis.  6.  Hypothyroidism Continue home dose Synthroid.  7.  Hypertension continue Lopressor ACE inhibitor and HCTZ  8.  Coronary artery disease-continue Plavix and Eliquis.  Continue to hold statin in light of elevated LFTs.  Continue HCTZ ACE inhibitor and Lopressor.    Estimated body mass index  is 22.43 kg/m as calculated from the following:   Height as of this  encounter: 6\' 1"  (1.854 m).   Weight as of this encounter: 77.1 kg.  Discharge Instructions   Allergies as of 09/20/2019   No Known Allergies     Medication List    STOP taking these medications   ezetimibe 10 MG tablet Commonly known as: ZETIA   levocetirizine 5 MG tablet Commonly known as: XYZAL   loratadine 10 MG tablet Commonly known as: CLARITIN     TAKE these medications   amoxicillin-clavulanate 875-125 MG tablet Commonly known as: Augmentin Take 1 tablet by mouth 2 (two) times daily for 10 days.   apixaban 5 MG Tabs tablet Commonly known as: Eliquis Take 1 tablet (5 mg total) by mouth 2 (two) times daily.   B-12 2500 MCG Subl Place 2,500 mg under the tongue daily at 12 noon.   calcium carbonate 500 MG chewable tablet Commonly known as: TUMS - dosed in mg elemental calcium Chew 1 tablet by mouth as needed for indigestion or heartburn.   Cinnamon 500 MG Tabs Take 1,000 tablets by mouth daily at 12 noon.   CITRUCEL PO Take 1 tablet by mouth daily at 12 noon.   clopidogrel 75 MG tablet Commonly known as: PLAVIX Take 75 mg by mouth daily.   docusate sodium 100 MG capsule Commonly known as: COLACE Take 100 mg by mouth daily.   fluticasone 50 MCG/ACT nasal spray Commonly known as: FLONASE Place 2 sprays into both nostrils daily for 30 days.   glucosamine-chondroitin 500-400 MG tablet Take 1 tablet by mouth daily.   hydrochlorothiazide 12.5 MG capsule Commonly known as: MICROZIDE TAKE 1 CAPSULE BY MOUTH ONCE DAILY.   levothyroxine 25 MCG tablet Commonly known as: SYNTHROID Take 25 mcg by mouth daily before breakfast.   lisinopril 40 MG tablet Commonly known as: ZESTRIL Take 1 tablet (40 mg total) by mouth daily. What changed:   how much to take  when to take this   Lycopene 10 MG Caps Take 10 mg by mouth daily.   magnesium oxide 400 MG tablet Commonly known as: MAG-OX Take 400 mg by mouth daily at 12 noon.   metoprolol tartrate 25 MG  tablet Commonly known as: LOPRESSOR Take 1 tablet (25 mg total) by mouth 2 (two) times daily.   multivitamin capsule Take 1 capsule by mouth daily at 12 noon.   nitroGLYCERIN 0.4 MG SL tablet Commonly known as: NITROSTAT Place 1 tablet (0.4 mg total) under the tongue every 5 (five) minutes as needed for chest pain.   omega-3 acid ethyl esters 1 g capsule Commonly known as: LOVAZA Take 1,400 mg by mouth 2 (two) times daily.   ondansetron 4 MG tablet Commonly known as: ZOFRAN Take 1 tablet (4 mg total) by mouth every 6 (six) hours as needed for nausea.   pantoprazole 40 MG tablet Commonly known as: PROTONIX Take 1 tablet (40 mg total) by mouth daily. 30 mins before a meal   polyvinyl alcohol 1.4 % ophthalmic solution Commonly known as: LIQUIFILM TEARS Place 1 drop into both eyes as needed for dry eyes.   PROBIOTIC DAILY PO Take 1 tablet by mouth daily at 12 noon.   rosuvastatin 10 MG tablet Commonly known as: CRESTOR Take 1 tablet (10 mg total) by mouth daily at 6 PM. What changed:   how much to take  when to take this   Saw Palmetto (Serenoa repens) 1000 MG Caps Take 2,000 mg by  mouth 2 (two) times daily.   tamsulosin 0.4 MG Caps capsule Commonly known as: FLOMAX Take 0.4 mg by mouth at bedtime.   Vitamin D (Ergocalciferol) 1.25 MG (50000 UNIT) Caps capsule Commonly known as: DRISDOL Take 50,000 Units by mouth every Tuesday.      Follow-up Information    Street, Sharon Mt, MD Follow up.   Specialty: Family Medicine Contact information: [place] [address] [phone]        Park Liter, MD .   Specialty: Cardiology Contact information: [place] [address] [phone]        Doran Stabler, MD Follow up.   Specialty: Gastroenterology Contact information: [place] [address] [phone]        Ladell Pier, MD Follow up.   Specialty: Oncology Contact  information: [place] [address] [phone]          No Known Allergies  Consultations: GI Dr. Ardis Bird Oncology Dr. Benay Bird  Procedures/Studies: CT CHEST W CONTRAST  Result Date: 09/19/2019 CLINICAL DATA:  New diagnosis of pancreatic cancer.  Staging. EXAM: CT CHEST WITH CONTRAST TECHNIQUE: Multidetector CT imaging of the chest was performed during intravenous contrast administration. CONTRAST:  38mL OMNIPAQUE IOHEXOL 300 MG/ML  SOLN COMPARISON:  None. FINDINGS: Cardiovascular: The heart size is normal. No substantial pericardial effusion. Coronary artery calcification is evident. Atherosclerotic calcification is noted in the wall of the thoracic aorta. Ascending thoracic aorta measures 4.1 cm diameter. Mediastinum/Nodes: No mediastinal lymphadenopathy. There is no hilar lymphadenopathy. The esophagus has normal imaging features. There is no axillary lymphadenopathy. Lungs/Pleura: 5 mm right middle lobe perifissural nodule identified on image 102/series 7. There is dependent collapse/consolidative change in the right lower lobe, likely atelectasis. 4 mm left lower lobe subpleural nodule noted on 83/7 with another 5 mm left lower lobe subpleural nodule evident on 78/7. Posterior left lower lobe calcified granuloma noted. No focal airspace consolidation. No pleural effusion. Upper Abdomen: Gallbladder is distended. Intra and extrahepatic biliary duct dilatation noted with common bile duct stent visualized in situ, findings better characterized on the dedicated abdomen and pelvis CT yesterday. Musculoskeletal: No worrisome lytic or sclerotic osseous abnormality. IMPRESSION: 1. No definite metastatic disease in the chest. 2. Scattered tiny bilateral pulmonary nodules. These are perifissural and subpleural in location, likely lymph nodes. Attention on follow-up surveillance imaging recommended. 3. Probable atelectasis posterior right lower lobe given new onset since  yesterday's study. 4. Ascending thoracic aortic aneurysm at 4.1 cm diameter. Recommend annual imaging followup by CTA or MRA. This recommendation follows 2010 ACCF/AHA/AATS/ACR/ASA/SCA/SCAI/SIR/STS/SVM Guidelines for the Diagnosis and Management of Patients with Thoracic Aortic Disease. Circulation. 2010; [id number]. Aortic aneurysm NOS (ICD10-I71.9) 5.  Aortic Atherosclerois (ICD10-170.0) Electronically Signed   By: Misty Stanley M.D.   On: 09/19/2019 16:32   CT Abdomen Pelvis W Contrast  Result Date: 09/18/2019 CLINICAL DATA:  Painless postoperative fever. Biliary stent placement. New diagnosis of pancreatic cancer. EXAM: CT ABDOMEN AND PELVIS WITH CONTRAST TECHNIQUE: Multidetector CT imaging of the abdomen and pelvis was performed using the standard protocol following bolus administration of intravenous contrast. CONTRAST:  192mL OMNIPAQUE IOHEXOL 300 MG/ML  SOLN COMPARISON:  CT scan dated 08/25/2019 FINDINGS: Lower chest: No acute abnormalities. Aortic atherosclerosis. Coronary artery calcifications. Heart size is normal. Lung bases are clear. Hepatobiliary: Diffuse dilatation of the biliary tree including distention of the gallbladder persists after stent placement. The stent appears to be in good position.  There is new slight enhancement of the wall of the distended gallbladder is compared to the prior study. There is a new small amount of fluid in the gallbladder fossa. The liver parenchyma is otherwise normal. Pancreas: There is diffuse pancreatic atrophy with chronic dilatation of the pancreatic duct. Slight enhancement of the pancreatic head near the ampulla likely represents tumor. There is no evidence of inflammation around the pancreas. Spleen: Normal. Adrenals/Urinary Tract: Adrenal glands are unremarkable. Kidneys are normal, without renal calculi, focal lesion, or hydronephrosis. Enlargement of the median lobe of the prostate gland. The bladder otherwise appears normal. Stomach/Bowel: Stomach  is within normal limits. Appendix appears normal. No evidence of bowel wall thickening, distention, or inflammatory changes. Vascular/Lymphatic: Extensive aortic atherosclerosis. No adenopathy. Reproductive: Enlarged median lobe of the prostate gland. Other: No ascites. No free air. Musculoskeletal: No acute or significant osseous findings. IMPRESSION: 1. Persistent dilatation of the biliary tree after stent placement. The patient's bilirubin has risen. The possibility of stent obstruction should be considered. Slight new enhancement of the wall of the distended gallbladder with a new tiny amount of fluid around the distended gallbladder. This could represent early infection. 2. Slight enhancement of the pancreatic head near the ampulla consistent with tumor. 3. No evidence of inflammation around the pancreas. 4. No other acute abnormalities. 5. Extensive aortic atherosclerosis. 6. Enlarged median lobe of the prostate gland. Aortic Atherosclerosis (ICD10-I70.0). Electronically Signed   By: Lorriane Shire M.D.   On: 09/18/2019 13:10   DG Chest Portable 1 View  Result Date: 09/18/2019 CLINICAL DATA:  Persistent fever since pancreatic stent placement 4 days ago. EXAM: PORTABLE CHEST 1 VIEW COMPARISON:  Radiographs 03/16/2019 and 09/06/2018. FINDINGS: 1152 hours. The heart size and mediastinal contours are normal. The lungs are clear. There is no pleural effusion or pneumothorax. No acute osseous findings are identified. There are stable old left-sided rib fractures. Telemetry leads overlie the chest. IMPRESSION: Stable chest. No active cardiopulmonary process. Electronically Signed   By: Richardean Sale M.D.   On: 09/18/2019 12:43   DG ERCP  Result Date: 09/14/2019 CLINICAL DATA:  ERCP with stent placement. EXAM: ERCP TECHNIQUE: Multiple spot images obtained with the fluoroscopic device and submitted for interpretation post-procedure. COMPARISON:  CT abdomen pelvis-08/25/2019 FLUOROSCOPY TIME:  Not provided  FINDINGS: ERCP probe overlies the right upper abdominal quadrant. There is selective cannulation opacification the common bile duct which appears moderately to markedly dilated. There is an apparent moderate length slightly irregular narrowing involving the distal aspect of the CBD. Subsequent images demonstrate placement of a biliary stent within the mid/distal aspect of the CBD with distal end overlying expected location of duodenum. IMPRESSION: ERCP with biliary stent placement as above. These images were submitted for radiologic interpretation only. Please see the procedural report for the amount of contrast and the fluoroscopy time utilized. Electronically Signed   By: Sandi Mariscal M.D.   On: 09/14/2019 14:02    (Echo, Carotid, EGD, Colonoscopy, ERCP)    Subjective: He is resting in bed anxious to go home complains of constipation asking for stool softener  Discharge Exam: Vitals:   09/19/19 2058 09/20/19 0600  BP: (!) 166/79 (!) 160/75  Pulse: 71 62  Resp: 18   Temp: 98.2 F (36.8 C) 98.3 F (36.8 C)  SpO2: 98% 98%   Vitals:   09/19/19 1933 09/19/19 2049 09/19/19 2058 09/20/19 0600  BP:  (!) 156/73 (!) 166/79 (!) 160/75  Pulse: 66 64 71 62  Resp:  18 18  Temp: 98.9 F (37.2 C) 99.2 F (37.3 C) 98.2 F (36.8 C) 98.3 F (36.8 C)  TempSrc: Oral Oral  Oral  SpO2: 100% 100% 98% 98%  Weight:      Height:        General: Pt is alert, awake, not in acute distress Cardiovascular: RRR, S1/S2 +, no rubs, no gallops Respiratory: CTA bilaterally, no wheezing, no rhonchi Abdominal: Soft, NT, ND, bowel sounds + Extremities: no edema, no cyanosis    The results of significant diagnostics from this hospitalization (including imaging, microbiology, ancillary and laboratory) are listed below for reference.     Microbiology: Recent Results (from the past 240 hour(s))  SARS CORONAVIRUS 2 (TAT 6-24 HRS) Nasopharyngeal Nasopharyngeal Swab     Status: None   Collection Time: 09/11/19  10:32 AM   Specimen: Nasopharyngeal Swab  Result Value Ref Range Status   SARS Coronavirus 2 NEGATIVE NEGATIVE Final    Comment: (NOTE) SARS-CoV-2 target nucleic acids are NOT DETECTED. The SARS-CoV-2 RNA is generally detectable in upper and lower respiratory specimens during the acute phase of infection. Negative results do not preclude SARS-CoV-2 infection, do not rule out co-infections with other pathogens, and should not be used as the sole basis for treatment or other patient management decisions. Negative results must be combined with clinical observations, patient history, and epidemiological information. The expected result is Negative. Fact Sheet for Patients: SugarRoll.be Fact Sheet for Healthcare Providers: https://www.woods-.com/ This test is not yet approved or cleared by the [place] FDA and  has been authorized for detection and/or diagnosis of SARS-CoV-2 by FDA under an Emergency Use Authorization (EUA). This EUA will remain  in effect (meaning this test can be used) for the duration of the COVID-19 declaration under Section 56 4(b)(1) of the Act, 21 U.S.C. section 360bbb-3(b)(1), unless the authorization is terminated or revoked sooner. Performed at [facility], [place] [address] [place], [place] [address]   Urine culture     Status: Abnormal   Collection Time: 09/18/19 11:50 AM   Specimen: Urine, Clean Catch  Result Value Ref Range Status   Specimen Description   Final    URINE, CLEAN CATCH Performed at [facility], [place] [address], [place], [place] [address]    Special Requests   Final    NONE Performed at [facility], [place] [address], [place], [place] [address]    Culture MULTIPLE SPECIES PRESENT, SUGGEST RECOLLECTION (A)  Final   Report Status 09/19/2019 FINAL  Final  Blood culture (routine x 2)     Status: None (Preliminary result)   Collection Time: 09/18/19  12:01 PM   Specimen: BLOOD  Result Value Ref Range Status   Specimen Description   Final    BLOOD LEFT ANTECUBITAL Performed at [facility] [address] [place], [place] [address]    Special Requests   Final    BOTTLES DRAWN AEROBIC AND ANAEROBIC Blood Culture adequate volume Performed at [facility] [address], [place], [place] [address]    Culture   Final    NO GROWTH 2 DAYS Performed at [facility] [address] [place], [place] [address]    Report Status PENDING  Incomplete  Respiratory Panel by RT PCR (Flu A&B, Covid) - Nasopharyngeal Swab     Status: None   Collection Time: 09/18/19 12:54 PM   Specimen: Nasopharyngeal Swab  Result Value Ref Range Status   SARS Coronavirus 2 by RT  PCR NEGATIVE NEGATIVE Final    Comment: (NOTE) SARS-CoV-2 target nucleic acids are NOT DETECTED. The SARS-CoV-2 RNA is generally detectable in upper respiratoy specimens during the acute phase of infection. The lowest concentration of SARS-CoV-2 viral copies this assay can detect is 131 copies/mL. A negative result does not preclude SARS-Cov-2 infection and should not be used as the sole basis for treatment or other patient management decisions. A negative result may occur with  improper specimen collection/handling, submission of specimen other than nasopharyngeal swab, presence of viral mutation(s) within the areas targeted by this assay, and inadequate number of viral copies (<131 copies/mL). A negative result must be combined with clinical observations, patient history, and epidemiological information. The expected result is Negative. Fact Sheet for Patients:  PinkCheek.be Fact Sheet for Healthcare Providers:  GravelBags.it This test is not yet ap proved or cleared by the [place] FDA and  has been authorized for detection and/or diagnosis of SARS-CoV-2 by FDA under an  Emergency Use Authorization (EUA). This EUA will remain  in effect (meaning this test can be used) for the duration of the COVID-19 declaration under Section 564(b)(1) of the Act, 21 U.S.C. section 360bbb-3(b)(1), unless the authorization is terminated or revoked sooner.    Influenza A by PCR NEGATIVE NEGATIVE Final   Influenza B by PCR NEGATIVE NEGATIVE Final    Comment: (NOTE) The Xpert Xpress SARS-CoV-2/FLU/RSV assay is intended as an aid in  the diagnosis of influenza from Nasopharyngeal swab specimens and  should not be used as a sole basis for treatment. Nasal washings and  aspirates are unacceptable for Xpert Xpress SARS-CoV-2/FLU/RSV  testing. Fact Sheet for Patients: PinkCheek.be Fact Sheet for Healthcare Providers: GravelBags.it This test is not yet approved or cleared by the [place] FDA and  has been authorized for detection and/or diagnosis of SARS-CoV-2 by  FDA under an Emergency Use Authorization (EUA). This EUA will remain  in effect (meaning this test can be used) for the duration of the  Covid-19 declaration under Section 564(b)(1) of the Act, 21  U.S.C. section 360bbb-3(b)(1), unless the authorization is  terminated or revoked. Performed at [facility], [place] [address], [place], [place] [address]   Blood culture (routine x 2)     Status: None (Preliminary result)   Collection Time: 09/18/19  4:41 PM   Specimen: BLOOD  Result Value Ref Range Status   Specimen Description   Final    BLOOD RIGHT ANTECUBITAL Performed at [facility] [address] [place], [place] [address]    Special Requests   Final    BOTTLES DRAWN AEROBIC AND ANAEROBIC Blood Culture adequate volume Performed at [facility] [address], [place], [place] [address]    Culture   Final    NO GROWTH 2 DAYS Performed at [facility] [address]  [place], [place] [address]    Report Status PENDING  Incomplete     Labs: BNP (last 3 results) No results for input(s): BNP in the last 8760 hours. Basic Metabolic Panel: Recent Labs  Lab 09/14/19 0910 09/18/19 1201 09/18/19 1641 09/19/19 0315 09/20/19 0253  NA 134* 131*  --  132* 134*  K 3.5 3.1*  --  4.1 3.9  CL 101 99  --  104 107  CO2 24 23  --  22 22  GLUCOSE 109* 114*  --  106* 117*  BUN 14 20  --  16 13  CREATININE 0.85 1.00  --  0.81 1.01  CALCIUM 9.0 8.8*  --  8.3* 8.2*  MG  --   --  1.6* 2.0  --   PHOS  --   --  3.8  --   --    Liver Function Tests: Recent Labs  Lab 09/14/19 0910 09/18/19 1201 09/19/19 0315 09/20/19 0253  AST 258* 338* 205* 134*  ALT 267* 329* 238* 183*  ALKPHOS 362* 436* 334* 326*  BILITOT 1.3* 1.8* 1.7* 1.0  PROT 7.2 7.0 5.9* 5.7*  ALBUMIN 3.9 3.8 3.2* 3.0*   Recent Labs  Lab 09/18/19 1201  LIPASE 13   No results for input(s): AMMONIA in the last 168 hours. CBC: Recent Labs  Lab 09/14/19 0910 09/18/19 1201 09/19/19 0315 09/20/19 0253  WBC 5.5 11.6* 8.8 5.6  NEUTROABS  --  10.6* 7.4 4.0  HGB 13.2 12.6* 11.0* 11.0*  HCT 37.6* 36.8* 31.9* 32.4*  MCV 94.7 94.6 95.2 96.4  PLT 141* 131* 111* 118*   Cardiac Enzymes: No results for input(s): CKTOTAL, CKMB, CKMBINDEX, TROPONINI in the last 168 hours. BNP: Invalid input(s): POCBNP CBG: Recent Labs  Lab 09/18/19 1134  GLUCAP 107*   D-Dimer No results for input(s): DDIMER in the last 72 hours. Hgb A1c No results for input(s): HGBA1C in the last 72 hours. Lipid Profile No results for input(s): CHOL, HDL, LDLCALC, TRIG, CHOLHDL, LDLDIRECT in the last 72 hours. Thyroid function studies No results for input(s): TSH, T4TOTAL, T3FREE, THYROIDAB in the last 72 hours.  Invalid input(s): FREET3 Anemia work up No results for input(s): VITAMINB12, FOLATE, FERRITIN, TIBC, IRON, RETICCTPCT in the last 72 hours. Urinalysis    Component Value Date/Time   COLORURINE AMBER (A) 09/18/2019  1152   APPEARANCEUR CLEAR 09/18/2019 1152   LABSPEC 1.019 09/18/2019 1152   PHURINE 6.0 09/18/2019 1152   GLUCOSEU NEGATIVE 09/18/2019 1152   GLUCOSEU NEGATIVE 05/11/2007 0948   HGBUR NEGATIVE 09/18/2019 1152   BILIRUBINUR NEGATIVE 09/18/2019 1152   KETONESUR NEGATIVE 09/18/2019 1152   PROTEINUR NEGATIVE 09/18/2019 1152   UROBILINOGEN 0.2 mg/dL 05/11/2007 0948   NITRITE NEGATIVE 09/18/2019 1152   LEUKOCYTESUR NEGATIVE 09/18/2019 1152   Sepsis Labs Invalid input(s): PROCALCITONIN,  WBC,  LACTICIDVEN Microbiology Recent Results (from the past 240 hour(s))  SARS CORONAVIRUS 2 (TAT 6-24 HRS) Nasopharyngeal Nasopharyngeal Swab     Status: None   Collection Time: 09/11/19 10:32 AM   Specimen: Nasopharyngeal Swab  Result Value Ref Range Status   SARS Coronavirus 2 NEGATIVE NEGATIVE Final    Comment: (NOTE) SARS-CoV-2 target nucleic acids are NOT DETECTED. The SARS-CoV-2 RNA is generally detectable in upper and lower respiratory specimens during the acute phase of infection. Negative results do not preclude SARS-CoV-2 infection, do not rule out co-infections with other pathogens, and should not be used as the sole basis for treatment or other patient management decisions. Negative results must be combined with clinical observations, patient history, and epidemiological information. The expected result is Negative. Fact Sheet for Patients: SugarRoll.be Fact Sheet for Healthcare Providers: https://www.woods-.com/ This test is not yet approved or cleared by the [place] FDA and  has been authorized for detection and/or diagnosis of SARS-CoV-2 by FDA under an Emergency Use Authorization (EUA). This EUA will remain  in effect (meaning this test can be used) for the duration of the COVID-19 declaration under Section 56 4(b)(1) of the Act, 21 U.S.C. section 360bbb-3(b)(1), unless the authorization is terminated or revoked  sooner. Performed at [facility], [place] [address], [place], [place]  [id number]   Urine culture     Status: Abnormal   Collection Time: 09/18/19 11:50 AM   Specimen: Urine, Clean Catch  Result Value Ref Range Status   Specimen Description   Final    URINE, CLEAN CATCH Performed at [facility], [place] [address], [place], [place] [address]    Special Requests   Final    NONE Performed at [facility], [place] [address], [place], [place] [address]    Culture MULTIPLE SPECIES PRESENT, SUGGEST RECOLLECTION (A)  Final   Report Status 09/19/2019 FINAL  Final  Blood culture (routine x 2)     Status: None (Preliminary result)   Collection Time: 09/18/19 12:01 PM   Specimen: BLOOD  Result Value Ref Range Status   Specimen Description   Final    BLOOD LEFT ANTECUBITAL Performed at [place] [address] [place], [place] [address]    Special Requests   Final    BOTTLES DRAWN AEROBIC AND ANAEROBIC Blood Culture adequate volume Performed at [place] [address], [place], [place] [address]    Culture   Final    NO GROWTH 2 DAYS Performed at [place] [address] [place], [place] [address]    Report Status PENDING  Incomplete  Respiratory Panel by RT PCR (Flu A&B, Covid) - Nasopharyngeal Swab     Status: None   Collection Time: 09/18/19 12:54 PM   Specimen: Nasopharyngeal Swab  Result Value Ref Range Status   SARS Coronavirus 2 by RT PCR NEGATIVE NEGATIVE Final    Comment: (NOTE) SARS-CoV-2 target nucleic acids are NOT DETECTED. The SARS-CoV-2 RNA is generally detectable in upper respiratoy specimens during the acute phase of infection. The lowest concentration of SARS-CoV-2 viral copies this assay can detect is 131 copies/mL. A negative result does not preclude SARS-Cov-2 infection and should not be used as the sole basis for treatment or other patient management decisions. A  negative result may occur with  improper specimen collection/handling, submission of specimen other than nasopharyngeal swab, presence of viral mutation(s) within the areas targeted by this assay, and inadequate number of viral copies (<131 copies/mL). A negative result must be combined with clinical observations, patient history, and epidemiological information. The expected result is Negative. Fact Sheet for Patients:  PinkCheek.be Fact Sheet for Healthcare Providers:  GravelBags.it This test is not yet ap proved or cleared by the [place] FDA and  has been authorized for detection and/or diagnosis of SARS-CoV-2 by FDA under an Emergency Use Authorization (EUA). This EUA will remain  in effect (meaning this test can be used) for the duration of the COVID-19 declaration under Section 564(b)(1) of the Act, 21 U.S.C. section 360bbb-3(b)(1), unless the authorization is terminated or revoked sooner.    Influenza A by PCR NEGATIVE NEGATIVE Final   Influenza B by PCR NEGATIVE NEGATIVE Final    Comment: (NOTE) The Xpert Xpress SARS-CoV-2/FLU/RSV assay is intended as an aid in  the diagnosis of influenza from Nasopharyngeal swab specimens and  should not be used as a sole basis for treatment. Nasal washings and  aspirates are unacceptable for Xpert Xpress SARS-CoV-2/FLU/RSV  testing. Fact Sheet for Patients: PinkCheek.be Fact Sheet for Healthcare Providers: GravelBags.it This test is not yet approved or cleared by the [place] FDA and  has been authorized for detection and/or diagnosis of SARS-CoV-2 by  FDA under an Emergency Use Authorization (EUA). This EUA will remain  in effect (meaning  this test can be used) for the duration of the  Covid-19 declaration under Section 564(b)(1) of the Act, 21  U.S.C. section 360bbb-3(b)(1), unless the authorization is   terminated or revoked. Performed at [facility], [place] [address], [place], [place] [address]   Blood culture (routine x 2)     Status: None (Preliminary result)   Collection Time: 09/18/19  4:41 PM   Specimen: BLOOD  Result Value Ref Range Status   Specimen Description   Final    BLOOD RIGHT ANTECUBITAL Performed at [place] [address] [place], [place] [address]    Special Requests   Final    BOTTLES DRAWN AEROBIC AND ANAEROBIC Blood Culture adequate volume Performed at [place] [address], [place], [place] [address]    Culture   Final    NO GROWTH 2 DAYS Performed at [place] [address], [place], [place] [address]    Report Status PENDING  Incomplete     Time coordinating discharge:  38 minutes  SIGNED:   Georgette Shell, MD  [facility] 09/20/2019, 9:50 AM Pager   If 7PM-7AM, please contact night-coverage [email or web address] Password [email or web address]

## 2019-09-20 NOTE — Plan of Care (Signed)
  Problem: Education: Goal: Knowledge of General Education information will improve Description: Including pain rating scale, medication(s)/side effects and non-pharmacologic comfort measures 09/20/2019 1306 by Zadie Rhine, RN Outcome: Adequate for Discharge 09/20/2019 1127 by Zadie Rhine, RN Outcome: Progressing   Problem: Health Behavior/Discharge Planning: Goal: Ability to manage health-related needs will improve 09/20/2019 1306 by Zadie Rhine, RN Outcome: Adequate for Discharge 09/20/2019 1127 by Zadie Rhine, RN Outcome: Progressing   Problem: Clinical Measurements: Goal: Ability to maintain clinical measurements within normal limits will improve 09/20/2019 1306 by Zadie Rhine, RN Outcome: Adequate for Discharge 09/20/2019 1127 by Zadie Rhine, RN Outcome: Progressing Goal: Will remain free from infection Outcome: Adequate for Discharge Goal: Diagnostic test results will improve Outcome: Adequate for Discharge Goal: Respiratory complications will improve Outcome: Adequate for Discharge Goal: Cardiovascular complication will be avoided Outcome: Adequate for Discharge   Problem: Activity: Goal: Risk for activity intolerance will decrease Outcome: Adequate for Discharge   Problem: Nutrition: Goal: Adequate nutrition will be maintained Outcome: Adequate for Discharge   Problem: Coping: Goal: Level of anxiety will decrease Outcome: Adequate for Discharge   Problem: Elimination: Goal: Will not experience complications related to bowel motility Outcome: Adequate for Discharge Goal: Will not experience complications related to urinary retention Outcome: Adequate for Discharge   Problem: Pain Managment: Goal: General experience of comfort will improve Outcome: Adequate for Discharge   Problem: Safety: Goal: Ability to remain free from injury will improve Outcome: Adequate for Discharge   Problem: Skin Integrity: Goal: Risk for impaired  skin integrity will decrease Outcome: Adequate for Discharge

## 2019-09-20 NOTE — Progress Notes (Signed)
Pt c/o of itchiness after Vancomycin, no redness or rash noted. MD notified and updated. SRP, RN

## 2019-09-21 ENCOUNTER — Emergency Department (HOSPITAL_COMMUNITY): Payer: Medicare Other

## 2019-09-21 ENCOUNTER — Telehealth: Payer: Self-pay | Admitting: Gastroenterology

## 2019-09-21 ENCOUNTER — Encounter (HOSPITAL_COMMUNITY): Payer: Self-pay

## 2019-09-21 ENCOUNTER — Other Ambulatory Visit: Payer: Self-pay

## 2019-09-21 ENCOUNTER — Inpatient Hospital Stay (HOSPITAL_COMMUNITY)
Admission: EM | Admit: 2019-09-21 | Discharge: 2019-09-29 | DRG: 091 | Disposition: A | Discharge status: Home Health | Payer: Medicare Other | Attending: Internal Medicine | Admitting: Internal Medicine

## 2019-09-21 ENCOUNTER — Telehealth: Payer: Self-pay | Admitting: Oncology

## 2019-09-21 ENCOUNTER — Ambulatory Visit: Payer: Medicare Other | Admitting: Oncology

## 2019-09-21 DIAGNOSIS — K831 Obstruction of bile duct: Secondary | ICD-10-CM | POA: Diagnosis present

## 2019-09-21 DIAGNOSIS — F329 Major depressive disorder, single episode, unspecified: Secondary | ICD-10-CM | POA: Diagnosis not present

## 2019-09-21 DIAGNOSIS — K56609 Unspecified intestinal obstruction, unspecified as to partial versus complete obstruction: Secondary | ICD-10-CM

## 2019-09-21 DIAGNOSIS — K8309 Other cholangitis: Secondary | ICD-10-CM

## 2019-09-21 DIAGNOSIS — M1990 Unspecified osteoarthritis, unspecified site: Secondary | ICD-10-CM | POA: Diagnosis present

## 2019-09-21 DIAGNOSIS — R109 Unspecified abdominal pain: Secondary | ICD-10-CM

## 2019-09-21 DIAGNOSIS — I440 Atrioventricular block, first degree: Secondary | ICD-10-CM | POA: Diagnosis present

## 2019-09-21 DIAGNOSIS — R7989 Other specified abnormal findings of blood chemistry: Secondary | ICD-10-CM | POA: Diagnosis not present

## 2019-09-21 DIAGNOSIS — F05 Delirium due to known physiological condition: Secondary | ICD-10-CM | POA: Diagnosis not present

## 2019-09-21 DIAGNOSIS — Z66 Do not resuscitate: Secondary | ICD-10-CM | POA: Diagnosis not present

## 2019-09-21 DIAGNOSIS — R21 Rash and other nonspecific skin eruption: Secondary | ICD-10-CM | POA: Diagnosis present

## 2019-09-21 DIAGNOSIS — Z85828 Personal history of other malignant neoplasm of skin: Secondary | ICD-10-CM

## 2019-09-21 DIAGNOSIS — Z781 Physical restraint status: Secondary | ICD-10-CM

## 2019-09-21 DIAGNOSIS — I6523 Occlusion and stenosis of bilateral carotid arteries: Secondary | ICD-10-CM | POA: Diagnosis not present

## 2019-09-21 DIAGNOSIS — I10 Essential (primary) hypertension: Secondary | ICD-10-CM | POA: Diagnosis present

## 2019-09-21 DIAGNOSIS — L299 Pruritus, unspecified: Secondary | ICD-10-CM | POA: Diagnosis present

## 2019-09-21 DIAGNOSIS — R4789 Other speech disturbances: Secondary | ICD-10-CM | POA: Diagnosis not present

## 2019-09-21 DIAGNOSIS — R29818 Other symptoms and signs involving the nervous system: Secondary | ICD-10-CM | POA: Diagnosis not present

## 2019-09-21 DIAGNOSIS — R4182 Altered mental status, unspecified: Secondary | ICD-10-CM | POA: Diagnosis not present

## 2019-09-21 DIAGNOSIS — T368X5A Adverse effect of other systemic antibiotics, initial encounter: Secondary | ICD-10-CM | POA: Diagnosis present

## 2019-09-21 DIAGNOSIS — K5641 Fecal impaction: Secondary | ICD-10-CM | POA: Diagnosis present

## 2019-09-21 DIAGNOSIS — Z8249 Family history of ischemic heart disease and other diseases of the circulatory system: Secondary | ICD-10-CM

## 2019-09-21 DIAGNOSIS — Z888 Allergy status to other drugs, medicaments and biological substances status: Secondary | ICD-10-CM

## 2019-09-21 DIAGNOSIS — Z823 Family history of stroke: Secondary | ICD-10-CM

## 2019-09-21 DIAGNOSIS — X58XXXA Exposure to other specified factors, initial encounter: Secondary | ICD-10-CM | POA: Diagnosis present

## 2019-09-21 DIAGNOSIS — R4701 Aphasia: Secondary | ICD-10-CM | POA: Diagnosis not present

## 2019-09-21 DIAGNOSIS — Z9842 Cataract extraction status, left eye: Secondary | ICD-10-CM

## 2019-09-21 DIAGNOSIS — G9341 Metabolic encephalopathy: Secondary | ICD-10-CM | POA: Diagnosis present

## 2019-09-21 DIAGNOSIS — I671 Cerebral aneurysm, nonruptured: Secondary | ICD-10-CM | POA: Diagnosis not present

## 2019-09-21 DIAGNOSIS — R410 Disorientation, unspecified: Secondary | ICD-10-CM | POA: Diagnosis present

## 2019-09-21 DIAGNOSIS — E7800 Pure hypercholesterolemia, unspecified: Secondary | ICD-10-CM | POA: Diagnosis present

## 2019-09-21 DIAGNOSIS — Z9841 Cataract extraction status, right eye: Secondary | ICD-10-CM

## 2019-09-21 DIAGNOSIS — I480 Paroxysmal atrial fibrillation: Secondary | ICD-10-CM | POA: Diagnosis not present

## 2019-09-21 DIAGNOSIS — E876 Hypokalemia: Secondary | ICD-10-CM | POA: Diagnosis present

## 2019-09-21 DIAGNOSIS — K5900 Constipation, unspecified: Secondary | ICD-10-CM

## 2019-09-21 DIAGNOSIS — Z7901 Long term (current) use of anticoagulants: Secondary | ICD-10-CM

## 2019-09-21 DIAGNOSIS — R945 Abnormal results of liver function studies: Secondary | ICD-10-CM | POA: Diagnosis not present

## 2019-09-21 DIAGNOSIS — R338 Other retention of urine: Secondary | ICD-10-CM | POA: Diagnosis present

## 2019-09-21 DIAGNOSIS — Z79899 Other long term (current) drug therapy: Secondary | ICD-10-CM

## 2019-09-21 DIAGNOSIS — Z86010 Personal history of colonic polyps: Secondary | ICD-10-CM

## 2019-09-21 DIAGNOSIS — Z20822 Contact with and (suspected) exposure to covid-19: Secondary | ICD-10-CM | POA: Diagnosis present

## 2019-09-21 DIAGNOSIS — I2510 Atherosclerotic heart disease of native coronary artery without angina pectoris: Secondary | ICD-10-CM | POA: Diagnosis not present

## 2019-09-21 DIAGNOSIS — N401 Enlarged prostate with lower urinary tract symptoms: Secondary | ICD-10-CM | POA: Diagnosis present

## 2019-09-21 DIAGNOSIS — T361X5A Adverse effect of cephalosporins and other beta-lactam antibiotics, initial encounter: Secondary | ICD-10-CM | POA: Diagnosis present

## 2019-09-21 DIAGNOSIS — T50905A Adverse effect of unspecified drugs, medicaments and biological substances, initial encounter: Secondary | ICD-10-CM | POA: Diagnosis present

## 2019-09-21 DIAGNOSIS — C259 Malignant neoplasm of pancreas, unspecified: Secondary | ICD-10-CM

## 2019-09-21 DIAGNOSIS — I959 Hypotension, unspecified: Secondary | ICD-10-CM | POA: Diagnosis not present

## 2019-09-21 DIAGNOSIS — Z7902 Long term (current) use of antithrombotics/antiplatelets: Secondary | ICD-10-CM

## 2019-09-21 DIAGNOSIS — E785 Hyperlipidemia, unspecified: Secondary | ICD-10-CM | POA: Diagnosis present

## 2019-09-21 DIAGNOSIS — Z79890 Hormone replacement therapy (postmenopausal): Secondary | ICD-10-CM

## 2019-09-21 DIAGNOSIS — Z955 Presence of coronary angioplasty implant and graft: Secondary | ICD-10-CM | POA: Diagnosis not present

## 2019-09-21 DIAGNOSIS — I674 Hypertensive encephalopathy: Secondary | ICD-10-CM | POA: Diagnosis present

## 2019-09-21 DIAGNOSIS — R001 Bradycardia, unspecified: Secondary | ICD-10-CM | POA: Diagnosis not present

## 2019-09-21 DIAGNOSIS — G9340 Encephalopathy, unspecified: Secondary | ICD-10-CM | POA: Diagnosis not present

## 2019-09-21 DIAGNOSIS — G92 Toxic encephalopathy: Secondary | ICD-10-CM | POA: Diagnosis not present

## 2019-09-21 DIAGNOSIS — R1310 Dysphagia, unspecified: Secondary | ICD-10-CM | POA: Diagnosis present

## 2019-09-21 DIAGNOSIS — R471 Dysarthria and anarthria: Secondary | ICD-10-CM | POA: Diagnosis not present

## 2019-09-21 DIAGNOSIS — K219 Gastro-esophageal reflux disease without esophagitis: Secondary | ICD-10-CM | POA: Diagnosis present

## 2019-09-21 DIAGNOSIS — Z4659 Encounter for fitting and adjustment of other gastrointestinal appliance and device: Secondary | ICD-10-CM | POA: Diagnosis not present

## 2019-09-21 DIAGNOSIS — C801 Malignant (primary) neoplasm, unspecified: Secondary | ICD-10-CM | POA: Diagnosis not present

## 2019-09-21 DIAGNOSIS — C249 Malignant neoplasm of biliary tract, unspecified: Secondary | ICD-10-CM | POA: Diagnosis not present

## 2019-09-21 DIAGNOSIS — E039 Hypothyroidism, unspecified: Secondary | ICD-10-CM | POA: Diagnosis present

## 2019-09-21 LAB — CBC WITH DIFFERENTIAL/PLATELET
Abs Immature Granulocytes: 0.01 10*3/uL (ref 0.00–0.07)
Basophils Absolute: 0 10*3/uL (ref 0.0–0.1)
Basophils Relative: 0 %
Eosinophils Absolute: 0.1 10*3/uL (ref 0.0–0.5)
Eosinophils Relative: 2 %
HCT: 36.6 % — ABNORMAL LOW (ref 39.0–52.0)
Hemoglobin: 12.6 g/dL — ABNORMAL LOW (ref 13.0–17.0)
Immature Granulocytes: 0 %
Lymphocytes Relative: 15 %
Lymphs Abs: 0.9 10*3/uL (ref 0.7–4.0)
MCH: 32.4 pg (ref 26.0–34.0)
MCHC: 34.4 g/dL (ref 30.0–36.0)
MCV: 94.1 fL (ref 80.0–100.0)
Monocytes Absolute: 0.6 10*3/uL (ref 0.1–1.0)
Monocytes Relative: 10 %
Neutro Abs: 4.5 10*3/uL (ref 1.7–7.7)
Neutrophils Relative %: 73 %
Platelets: 152 10*3/uL (ref 150–400)
RBC: 3.89 MIL/uL — ABNORMAL LOW (ref 4.22–5.81)
RDW: 13.6 % (ref 11.5–15.5)
WBC: 6.2 10*3/uL (ref 4.0–10.5)
nRBC: 0 % (ref 0.0–0.2)

## 2019-09-21 LAB — URINALYSIS, ROUTINE W REFLEX MICROSCOPIC
Bilirubin Urine: NEGATIVE
Glucose, UA: NEGATIVE mg/dL
Hgb urine dipstick: NEGATIVE
Ketones, ur: NEGATIVE mg/dL
Leukocytes,Ua: NEGATIVE
Nitrite: NEGATIVE
Protein, ur: NEGATIVE mg/dL
Specific Gravity, Urine: 1.01 (ref 1.005–1.030)
pH: 6 (ref 5.0–8.0)

## 2019-09-21 LAB — COMPREHENSIVE METABOLIC PANEL
ALT: 176 U/L — ABNORMAL HIGH (ref 0–44)
AST: 143 U/L — ABNORMAL HIGH (ref 15–41)
Albumin: 3.5 g/dL (ref 3.5–5.0)
Alkaline Phosphatase: 403 U/L — ABNORMAL HIGH (ref 38–126)
Anion gap: 8 (ref 5–15)
BUN: 17 mg/dL (ref 8–23)
CO2: 24 mmol/L (ref 22–32)
Calcium: 8.9 mg/dL (ref 8.9–10.3)
Chloride: 102 mmol/L (ref 98–111)
Creatinine, Ser: 0.81 mg/dL (ref 0.61–1.24)
GFR calc Af Amer: >60 mL/min (ref >60)
GFR calc non Af Amer: >60 mL/min (ref >60)
Glucose, Bld: 116 mg/dL — ABNORMAL HIGH (ref 70–99)
Potassium: 3.6 mmol/L (ref 3.5–5.1)
Sodium: 134 mmol/L — ABNORMAL LOW (ref 135–145)
Total Bilirubin: 2 mg/dL — ABNORMAL HIGH (ref 0.3–1.2)
Total Protein: 6.8 g/dL (ref 6.5–8.1)

## 2019-09-21 LAB — LIPASE, BLOOD: Lipase: 12 U/L (ref 11–51)

## 2019-09-21 LAB — PROTIME-INR
INR: 1.1 (ref 0.8–1.2)
Prothrombin Time: 14.2 seconds (ref 11.4–15.2)

## 2019-09-21 LAB — LACTIC ACID, PLASMA: Lactic Acid, Venous: 0.9 mmol/L (ref 0.5–1.9)

## 2019-09-21 LAB — MAGNESIUM: Magnesium: 2.1 mg/dL (ref 1.7–2.4)

## 2019-09-21 MED ORDER — IBUPROFEN 200 MG PO TABS
400.0000 mg | ORAL_TABLET | Freq: Four times a day (QID) | ORAL | Status: DC | PRN
  Administered 2019-09-22: 400 mg via ORAL
  Filled 2019-09-21 (×2): qty 2

## 2019-09-21 MED ORDER — METRONIDAZOLE IN NACL 5-0.79 MG/ML-% IV SOLN
500.0000 mg | Freq: Once | INTRAVENOUS | Status: DC
  Administered 2019-09-21: 500 mg via INTRAVENOUS
  Filled 2019-09-21: qty 100

## 2019-09-21 MED ORDER — METOPROLOL TARTRATE 25 MG PO TABS
25.0000 mg | ORAL_TABLET | Freq: Two times a day (BID) | ORAL | Status: DC
  Administered 2019-09-21 – 2019-09-29 (×13): 25 mg via ORAL
  Filled 2019-09-21 (×14): qty 1

## 2019-09-21 MED ORDER — SODIUM CHLORIDE 0.9 % IV SOLN: 250.0000 mL | INTRAVENOUS | Status: DC | PRN

## 2019-09-21 MED ORDER — NITROGLYCERIN 0.4 MG SL SUBL: 0.4000 mg | SUBLINGUAL_TABLET | SUBLINGUAL | Status: DC | PRN

## 2019-09-21 MED ORDER — GADOBUTROL 1 MMOL/ML IV SOLN
10.0000 mL | Freq: Once | INTRAVENOUS | Status: AC | PRN
  Administered 2019-09-21: 10 mL via INTRAVENOUS

## 2019-09-21 MED ORDER — LEVOTHYROXINE SODIUM 25 MCG PO TABS
25.0000 ug | ORAL_TABLET | Freq: Every day | ORAL | Status: DC
  Administered 2019-09-22 – 2019-09-24 (×3): 25 ug via ORAL
  Filled 2019-09-21 (×3): qty 1

## 2019-09-21 MED ORDER — SENNOSIDES-DOCUSATE SODIUM 8.6-50 MG PO TABS
1.0000 | ORAL_TABLET | Freq: Every evening | ORAL | Status: DC | PRN
  Administered 2019-09-24: 1 via ORAL
  Filled 2019-09-21: qty 1

## 2019-09-21 MED ORDER — APIXABAN 5 MG PO TABS: 5.0000 mg | ORAL_TABLET | Freq: Two times a day (BID) | ORAL | Status: DC

## 2019-09-21 MED ORDER — PANTOPRAZOLE SODIUM 40 MG PO TBEC
40.0000 mg | DELAYED_RELEASE_TABLET | Freq: Every day | ORAL | Status: DC
  Administered 2019-09-22 – 2019-09-29 (×5): 40 mg via ORAL
  Filled 2019-09-21 (×5): qty 1

## 2019-09-21 MED ORDER — DIPHENHYDRAMINE HCL 25 MG PO CAPS: 25.0000 mg | ORAL_CAPSULE | Freq: Every evening | ORAL | Status: DC | PRN

## 2019-09-21 MED ORDER — ONDANSETRON HCL 4 MG/2ML IJ SOLN: 4.0000 mg | Freq: Four times a day (QID) | INTRAMUSCULAR | Status: DC | PRN

## 2019-09-21 MED ORDER — SODIUM CHLORIDE 0.9 % IV SOLN: 2.0000 g | Freq: Once | INTRAVENOUS | Status: DC

## 2019-09-21 MED ORDER — TAMSULOSIN HCL 0.4 MG PO CAPS
0.4000 mg | ORAL_CAPSULE | Freq: Every day | ORAL | Status: DC
  Administered 2019-09-21 – 2019-09-28 (×6): 0.4 mg via ORAL
  Filled 2019-09-21 (×7): qty 1

## 2019-09-21 MED ORDER — HYDROCHLOROTHIAZIDE 12.5 MG PO CAPS
12.5000 mg | ORAL_CAPSULE | Freq: Every day | ORAL | Status: DC
  Administered 2019-09-22 – 2019-09-29 (×7): 12.5 mg via ORAL
  Filled 2019-09-21 (×7): qty 1

## 2019-09-21 MED ORDER — SODIUM CHLORIDE 0.9% FLUSH: 3.0000 mL | INTRAVENOUS | Status: DC | PRN

## 2019-09-21 MED ORDER — DIPHENHYDRAMINE HCL 50 MG/ML IJ SOLN
12.5000 mg | Freq: Once | INTRAMUSCULAR | Status: AC
  Administered 2019-09-21: 12.5 mg via INTRAVENOUS
  Filled 2019-09-21: qty 1

## 2019-09-21 MED ORDER — LISINOPRIL 20 MG PO TABS
20.0000 mg | ORAL_TABLET | Freq: Two times a day (BID) | ORAL | Status: DC
  Administered 2019-09-21 – 2019-09-29 (×13): 20 mg via ORAL
  Filled 2019-09-21 (×10): qty 1
  Filled 2019-09-21: qty 2
  Filled 2019-09-21 (×3): qty 1

## 2019-09-21 MED ORDER — CIPROFLOXACIN IN D5W 400 MG/200ML IV SOLN
400.0000 mg | Freq: Two times a day (BID) | INTRAVENOUS | Status: DC
  Administered 2019-09-21 – 2019-09-25 (×8): 400 mg via INTRAVENOUS
  Filled 2019-09-21 (×8): qty 200

## 2019-09-21 MED ORDER — SODIUM CHLORIDE 0.9 % IV BOLUS
1000.0000 mL | Freq: Once | INTRAVENOUS | Status: AC
  Administered 2019-09-21: 1000 mL via INTRAVENOUS

## 2019-09-21 MED ORDER — CLOPIDOGREL BISULFATE 75 MG PO TABS: 75.0000 mg | ORAL_TABLET | Freq: Every day | ORAL | Status: DC

## 2019-09-21 MED ORDER — SODIUM CHLORIDE 0.9% FLUSH
3.0000 mL | Freq: Two times a day (BID) | INTRAVENOUS | Status: DC
  Administered 2019-09-21 – 2019-09-22 (×2): 3 mL via INTRAVENOUS

## 2019-09-21 MED ORDER — ONDANSETRON HCL 4 MG PO TABS
4.0000 mg | ORAL_TABLET | Freq: Four times a day (QID) | ORAL | Status: DC | PRN
  Filled 2019-09-21: qty 1

## 2019-09-21 NOTE — ED Notes (Signed)
Patient transported to MRI 

## 2019-09-21 NOTE — Telephone Encounter (Signed)
I clf and lft a vm on the daughter's phone for Mr. Loughney to see Dr. Benay Spice for a hosp follow up on 2/19 at 11am.

## 2019-09-21 NOTE — Telephone Encounter (Signed)
Pt's daughter called again to inform that she is taking pt to the ED again due to concerns about some sxs that pt is having.

## 2019-09-21 NOTE — ED Notes (Signed)
Wyatt Portela, RN made aware of patient high BP and difficulty speaking again.   EDP and MD also aware of patients difficulty speaking that comes in waves.

## 2019-09-21 NOTE — Progress Notes (Signed)
Patient's blood pressure 190/98 at 1753 when he got to floor. Called flow manager for correct attending MD. Per flow manager MD available at 7 pm. Stated to call back if patient declines. Will continue to monitor patient. BP at 1835 171/89.

## 2019-09-21 NOTE — ED Notes (Addendum)
Patient returned from MRI.  Patient given meal tray.  Tech requested to take patient upstairs once finished eating.

## 2019-09-21 NOTE — Telephone Encounter (Signed)
Pt's daughter Ronald Bird called stating that pt was given some antibiotics when he was hospitalized and he is having troubles with them. She would like some advise.

## 2019-09-21 NOTE — ED Triage Notes (Signed)
Pt presents with c/o altered mental status. Pt reports that he had a procedure done approx one week ago, stent placement. Family at bedside reports that he has been more confused than normal and has been having some emotional problems. Pt is able to answer questions asked by this RN but family does have to help fill in some of the blanks. Pt was seen 3 days ago presenting with a fever at that time.

## 2019-09-21 NOTE — ED Provider Notes (Signed)
[facility] DEPT Provider Note   CSN: [id number] Arrival date & time: 09/21/19  1228     History Chief Complaint  Patient presents with  . Altered Mental Status    Ronald Bird is a 84 y.o. male. He had biliary stenting earlier this month for a pancreatic mass. He was readmitted to the hospital a few days later for fever and chills thought to be possibly a stent infection. He was discharged yesterday on Augmentin and took his first dose last night. About an hour later he had itching severe over his hands and trunk. He did not take another dose this morning but his daughter said he seems more confused than normal and more emotionally labile. Patient himself denies any headache blurry vision double vision sore throat cough chest pain abdominal pain vomiting diarrhea or urinary symptoms. No itching currently  The history is provided by the patient and a relative.  Altered Mental Status Presenting symptoms: behavior changes and confusion   Severity:  Moderate Most recent episode:  Today Episode history:  Single Timing:  Intermittent Progression:  Unchanged Chronicity:  New Context: recent change in medication, recent illness and recent infection   Associated symptoms: no abdominal pain, no difficulty breathing, no fever, no hallucinations, no headaches, no nausea, no rash, no seizures, no slurred speech, no visual change and no vomiting        Past Medical History:  Diagnosis Date  . BPH (benign prostatic hyperplasia)   . Cancer ([place])   . Coronary artery disease   . DJD (degenerative joint disease)   . GERD (gastroesophageal reflux disease)   . History of colonic polyps   . Hypercholesteremia   . Hypertension   . Pancreatic adenocarcinoma ([place]) 09/18/2019    Patient Active Problem List   Diagnosis Date Noted  . Biliary sepsis   . Acute cholangitis 09/18/2019  . Hypokalemia 09/18/2019  . Hypomagnesemia 09/18/2019  . Pancreatic  adenocarcinoma ([place]) 09/18/2019  . Biliary obstruction   . Pancreatic lesion   . Coronary artery disease 09/23/2018  . Unstable angina ([place]) 09/08/2018  . Paroxysmal atrial fibrillation ([place]) 08/30/2017  . Ascending aortic aneurysm ([place]) 08/30/2017  . Palpitations 03/29/2017  . Weakness 12/30/2010  . Fatigue 12/30/2010  . HYPERCHOLESTEROLEMIA 07/03/2008  . COLONIC POLYPS 05/15/2008  . Essential hypertension 05/15/2008  . GERD 05/15/2008  . DEGENERATIVE JOINT DISEASE 05/15/2008  . BENIGN PROSTATIC HYPERTROPHY, HX OF 05/15/2008    Past Surgical History:  Procedure Laterality Date  . BILIARY STENT PLACEMENT N/A 09/14/2019   Procedure: BILIARY STENT PLACEMENT;  Surgeon: Milus Banister, MD;  Location: [facility];  Service: Endoscopy;  Laterality: N/A;  . CATARACT EXTRACTION, BILATERAL  2012   In [facility]  . CORONARY ATHERECTOMY N/A 09/12/2018   Procedure: CORONARY ATHERECTOMY;  Surgeon: Jettie Booze, MD;  Location: [facility];  Service: Cardiovascular;  Laterality: N/A;  mid LAD  . CORONARY BALLOON ANGIOPLASTY N/A 09/12/2018   Procedure: CORONARY BALLOON ANGIOPLASTY;  Surgeon: Jettie Booze, MD;  Location: [facility];  Service: Cardiovascular;  Laterality: N/A;  diag 1  . CORONARY STENT INTERVENTION N/A 09/12/2018   Procedure: CORONARY STENT INTERVENTION;  Surgeon: Jettie Booze, MD;  Location: [facility];  Service: Cardiovascular;  Laterality: N/A;  mid LAD  . ENDOSCOPIC RETROGRADE CHOLANGIOPANCREATOGRAPHY (ERCP) WITH PROPOFOL N/A 09/14/2019   Procedure: ENDOSCOPIC RETROGRADE CHOLANGIOPANCREATOGRAPHY (ERCP) WITH PROPOFOL;  Surgeon: Milus Banister, MD;  Location: [facility];  Service: Endoscopy;  Laterality: N/A;  . ESOPHAGOGASTRODUODENOSCOPY (EGD) WITH PROPOFOL N/A 09/14/2019   Procedure: ESOPHAGOGASTRODUODENOSCOPY (EGD) WITH PROPOFOL;  Surgeon: Milus Banister, MD;  Location: [facility];  Service: Endoscopy;  Laterality: N/A;  . EUS  N/A 09/14/2019   Procedure: UPPER ENDOSCOPIC ULTRASOUND (EUS) RADIAL;  Surgeon: Milus Banister, MD;  Location: [facility];  Service: Endoscopy;  Laterality: N/A;  CAT 2  . FINE NEEDLE ASPIRATION N/A 09/14/2019   Procedure: FINE NEEDLE ASPIRATION (FNA) LINEAR;  Surgeon: Milus Banister, MD;  Location: [facility];  Service: Endoscopy;  Laterality: N/A;  . LEFT HEART CATH AND CORONARY ANGIOGRAPHY N/A 09/09/2018   Procedure: LEFT HEART CATH AND CORONARY ANGIOGRAPHY;  Surgeon: Burnell Blanks, MD;  Location: [facility];  Service: Cardiovascular;  Laterality: N/A;  . LEFT HEART CATH AND CORONARY ANGIOGRAPHY N/A 09/12/2018   Procedure: LEFT HEART CATH AND CORONARY ANGIOGRAPHY;  Surgeon: Jettie Booze, MD;  Location: [facility];  Service: Cardiovascular;  Laterality: N/A;  . MOHS SURGERY  2012   Skin Cancer removed from nose by [facility] in [place]  . SHOULDER SURGERY         Family History  Problem Relation Age of Onset  . Heart disease Father   . Stroke Father   . Hypertension Father   . Heart disease Mother   . Stroke Mother   . Hypertension Mother   . Atrial fibrillation Brother   . CAD Sister        Patient does not know details    Social History   Tobacco Use  . Smoking status: Never Smoker  . Smokeless tobacco: Never Used  Substance Use Topics  . Alcohol use: No  . Drug use: No    Home Medications Prior to Admission medications   Medication Sig Start Date End Date Taking? Authorizing Provider  amoxicillin-clavulanate (AUGMENTIN) 875-125 MG tablet Take 1 tablet by mouth 2 (two) times daily for 10 days. 09/20/19 09/30/19  Georgette Shell, MD  apixaban (ELIQUIS) 5 MG TABS tablet Take 1 tablet (5 mg total) by mouth 2 (two) times daily. 12/08/18   Park Liter, MD  calcium carbonate (TUMS - DOSED IN MG ELEMENTAL CALCIUM) 500 MG chewable tablet Chew 1 tablet by mouth as needed for indigestion or heartburn.    [provider]  Cinnamon  500 MG TABS Take 1,000 tablets by mouth daily at 12 noon.     [provider]  clopidogrel (PLAVIX) 75 MG tablet Take 75 mg by mouth daily. 08/30/19   [provider]  Cyanocobalamin (B-12) 2500 MCG SUBL Place 2,500 mg under the tongue daily at 12 noon.     [provider]  docusate sodium (COLACE) 100 MG capsule Take 100 mg by mouth daily.    [provider]  fluticasone (FLONASE) 50 MCG/ACT nasal spray Place 2 sprays into both nostrils daily for 30 days. Patient not taking: Reported on 09/04/2019 09/14/18 12/08/19  Daune Perch, NP  glucosamine-chondroitin 500-400 MG tablet Take 1 tablet by mouth daily.      [provider]  hydrochlorothiazide (MICROZIDE) 12.5 MG capsule TAKE 1 CAPSULE BY MOUTH ONCE DAILY. Patient taking differently: Take 12.5 mg by mouth daily.  05/29/19   Park Liter, MD  levothyroxine (SYNTHROID, LEVOTHROID) 25 MCG tablet Take 25 mcg by mouth daily before breakfast.    [provider]  lisinopril (ZESTRIL) 40 MG tablet Take 1 tablet (40 mg total) by mouth daily. Patient taking differently: Take 20  mg by mouth 2 (two) times daily.  12/08/18   Park Liter, MD  Lycopene 10 MG CAPS Take 10 mg by mouth daily.     [provider]  magnesium oxide (MAG-OX) 400 MG tablet Take 400 mg by mouth daily at 12 noon.     [provider]  Methylcellulose, Laxative, (CITRUCEL PO) Take 1 tablet by mouth daily at 12 noon.     [provider]  metoprolol tartrate (LOPRESSOR) 25 MG tablet Take 1 tablet (25 mg total) by mouth 2 (two) times daily. 12/08/18   Park Liter, MD  Multiple Vitamin (MULTIVITAMIN) capsule Take 1 capsule by mouth daily at 12 noon.     [provider]  nitroGLYCERIN (NITROSTAT) 0.4 MG SL tablet Place 1 tablet (0.4 mg total) under the tongue every 5 (five) minutes as needed for chest pain. 09/13/18   Daune Perch, NP  omega-3 acid ethyl esters (LOVAZA) 1 g capsule  Take 1,400 mg by mouth 2 (two) times daily.    [provider]  ondansetron (ZOFRAN) 4 MG tablet Take 1 tablet (4 mg total) by mouth every 6 (six) hours as needed for nausea. 09/20/19   Georgette Shell, MD  pantoprazole (PROTONIX) 40 MG tablet Take 1 tablet (40 mg total) by mouth daily. 30 mins before a meal  07/01/11   Noralee Space, MD  polyvinyl alcohol (LIQUIFILM TEARS) 1.4 % ophthalmic solution Place 1 drop into both eyes as needed for dry eyes. 09/20/19   Georgette Shell, MD  Probiotic Product (PROBIOTIC DAILY PO) Take 1 tablet by mouth daily at 12 noon.    [provider]  Saw Palmetto, Serenoa repens, 1000 MG CAPS Take 2,000 mg by mouth 2 (two) times daily.     [provider]  tamsulosin (FLOMAX) 0.4 MG CAPS capsule Take 0.4 mg by mouth at bedtime. 07/28/19   [provider]  Vitamin D, Ergocalciferol, (DRISDOL) 50000 units CAPS capsule Take 50,000 Units by mouth every Tuesday.     [provider]    Allergies    Patient has no known allergies.  Review of Systems   Review of Systems  Constitutional: Negative for fever.  HENT: Negative for sore throat.   Eyes: Negative for visual disturbance.  Respiratory: Negative for shortness of breath.   Cardiovascular: Negative for chest pain.  Gastrointestinal: Negative for abdominal pain, nausea and vomiting.  Genitourinary: Negative for dysuria.  Musculoskeletal: Negative for myalgias.  Skin: Negative for rash.  Neurological: Negative for seizures and headaches.  Psychiatric/Behavioral: Positive for confusion. Negative for hallucinations.    Physical Exam Updated Vital Signs BP (!) 171/88 (BP Location: Left Arm)   Pulse 72   Temp 98 F (36.7 C) (Oral)   Resp 18   SpO2 100%   Physical Exam Vitals and nursing note reviewed.  Constitutional:      Appearance: He is well-developed.  HENT:     Head: Normocephalic and atraumatic.  Eyes:     Conjunctiva/sclera: Conjunctivae  normal.  Cardiovascular:     Rate and Rhythm: Normal rate and regular rhythm.     Heart sounds: No murmur.  Pulmonary:     Effort: Pulmonary effort is normal. No respiratory distress.     Breath sounds: Normal breath sounds.  Abdominal:     Palpations: Abdomen is soft.     Tenderness: There is no abdominal tenderness. There is no guarding or rebound.  Musculoskeletal:        General:  No deformity or signs of injury. Normal range of motion.     Cervical back: Neck supple.  Skin:    General: Skin is warm and dry.     Capillary Refill: Capillary refill takes less than 2 seconds.  Neurological:     General: No focal deficit present.     Mental Status: He is alert.     GCS: GCS eye subscore is 4. GCS verbal subscore is 5. GCS motor subscore is 6.     Cranial Nerves: Cranial nerves are intact.     Sensory: Sensation is intact.     Motor: Motor function is intact.     ED Results / Procedures / Treatments   Labs (all labs ordered are listed, but only abnormal results are displayed) Labs Reviewed  COMPREHENSIVE METABOLIC PANEL - Abnormal; Notable for the following components:      Result Value   Sodium 134 (*)    Glucose, Bld 116 (*)    AST 143 (*)    ALT 176 (*)    Alkaline Phosphatase 403 (*)    Total Bilirubin 2.0 (*)    All other components within normal limits  CBC WITH DIFFERENTIAL/PLATELET - Abnormal; Notable for the following components:   RBC 3.89 (*)    Hemoglobin 12.6 (*)    HCT 36.6 (*)    All other components within normal limits  URINE CULTURE  LIPASE, BLOOD  LACTIC ACID, PLASMA  URINALYSIS, ROUTINE W REFLEX MICROSCOPIC  PROTIME-INR  MAGNESIUM  TSH  CBC WITH DIFFERENTIAL/PLATELET  PROTIME-INR    EKG None  Radiology CT Head Wo Contrast  Result Date: 09/21/2019 CLINICAL DATA:  84 year old with acute mental status changes. New diagnosis of pancreatic cancer with recent biliary stent placement on 09/14/2019. EXAM: CT HEAD WITHOUT CONTRAST TECHNIQUE:  Contiguous axial images were obtained from the base of the skull through the vertex without intravenous contrast. COMPARISON:  No prior head CT. Paranasal sinus CT 01/27/2017 is correlated. FINDINGS: Brain: Moderate to severe age related cortical and deep atrophy. Moderate to severe cerebellar atrophy. Severe changes of small vessel disease of the white matter diffusely. No mass lesion. No midline shift. No acute hemorrhage or hematoma. No extra-axial fluid collections. No evidence of acute infarction. Vascular: Severe BILATERAL carotid siphon and BILATERAL vertebral artery atherosclerosis. No hyperdense vessel. Skull: No skull fracture or other focal osseous abnormality involving the skull. Sinuses/Orbits: Visualized paranasal sinuses, bilateral mastoid air cells and bilateral middle ear cavities well-aerated. Visualized orbits and globes normal in appearance. Other: None. IMPRESSION: 1. No acute intracranial abnormality. 2. Moderate to severe age related generalized atrophy and severe chronic microvascular ischemic changes of the white matter. Electronically Signed   By: Evangeline Dakin M.D.   On: 09/21/2019 14:59   MR BRAIN W WO CONTRAST  Result Date: 09/21/2019 CLINICAL DATA:  84 year old male with new abnormal speech. History of pancreatic cancer, recent biliary stenting. EXAM: MRI HEAD WITHOUT AND WITH CONTRAST TECHNIQUE: Multiplanar, multiecho pulse sequences of the brain and surrounding structures were obtained without and with intravenous contrast. CONTRAST:  58mL GADAVIST GADOBUTROL 1 MMOL/ML IV SOLN COMPARISON:  Head CT earlier today. FINDINGS: Brain: No restricted diffusion or evidence of acute infarction. No abnormal enhancement identified. No dural thickening. No midline shift, mass effect, or evidence of intracranial mass lesion. No ventriculomegaly, extra-axial collection or acute intracranial hemorrhage. Cervicomedullary junction and pituitary are within normal limits. Widespread and confluent  bilateral cerebral white matter T2 and FLAIR hyperintensity corresponding to the CT hypodensity earlier today.  Deep white matter capsule involvement. Comparatively mild T2 heterogeneity in the basal ganglia. The thalami, brainstem, and cerebellum are within normal limits. No cortical encephalomalacia or chronic cerebral blood products identified. Vascular: Major intracranial vascular flow voids are preserved. The major dural venous sinuses are enhancing and appear to be patent. Skull and upper cervical spine: Cervical spinal stenosis in nature related to at C3-C4 appears to be degenerative left eccentric disc herniation (series 5, image 13). Visualized bone marrow signal is within normal limits. Sinuses/Orbits: Postoperative changes to the globes, otherwise negative orbits. Paranasal sinuses and mastoids are stable and well pneumatized. Other: Visible internal auditory structures appear normal. Scalp and face soft tissues appear negative. IMPRESSION: 1. No metastatic disease or acute intracranial abnormality identified. 2. Advanced but nonspecific signal changes in the cerebral white matter, most commonly due to chronic small vessel disease. 3. Evidence of degenerative cervical spinal stenosis related to disc herniation at C3-C4. Electronically Signed   By: Genevie Ann M.D.   On: 09/21/2019 17:01   DG Chest Port 1 View  Result Date: 09/21/2019 CLINICAL DATA:  Altered mental status. EXAM: PORTABLE CHEST 1 VIEW COMPARISON:  09/18/2019 FINDINGS: Cardiac silhouette is normal in size. No mediastinal or hilar masses. No evidence of adenopathy. Lungs are hyperexpanded, but clear. No pleural effusion or pneumothorax. Skeletal structures are grossly intact. IMPRESSION: No acute cardiopulmonary disease. Electronically Signed   By: Lajean Manes M.D.   On: 09/21/2019 14:21    Procedures Procedures (including critical care time)  Medications Ordered in ED Medications  ciprofloxacin (CIPRO) IVPB 400 mg (400 mg  Intravenous New Bag/Given 09/21/19 1809)  sodium chloride flush (NS) 0.9 % injection 3 mL (has no administration in time range)  sodium chloride flush (NS) 0.9 % injection 3 mL (has no administration in time range)  ibuprofen (ADVIL) tablet 400 mg (has no administration in time range)  senna-docusate (Senokot-S) tablet 1 tablet (has no administration in time range)  ondansetron (ZOFRAN) tablet 4 mg (has no administration in time range)    Or  ondansetron (ZOFRAN) injection 4 mg (has no administration in time range)  hydrochlorothiazide (MICROZIDE) capsule 12.5 mg (has no administration in time range)  lisinopril (ZESTRIL) tablet 20 mg (has no administration in time range)  metoprolol tartrate (LOPRESSOR) tablet 25 mg (has no administration in time range)  nitroGLYCERIN (NITROSTAT) SL tablet 0.4 mg (has no administration in time range)  levothyroxine (SYNTHROID) tablet 25 mcg (has no administration in time range)  pantoprazole (PROTONIX) EC tablet 40 mg (has no administration in time range)  tamsulosin (FLOMAX) capsule 0.4 mg (has no administration in time range)  diphenhydrAMINE (BENADRYL) capsule 25 mg (has no administration in time range)  0.9 %  sodium chloride infusion (has no administration in time range)  sodium chloride 0.9 % bolus 1,000 mL (0 mLs Intravenous Stopped 09/21/19 1511)  diphenhydrAMINE (BENADRYL) injection 12.5 mg (12.5 mg Intravenous Given 09/21/19 1314)  gadobutrol (GADAVIST) 1 MMOL/ML injection 10 mL (10 mLs Intravenous Contrast Given 09/21/19 1652)    ED Course  I have reviewed the triage vital signs and the nursing notes.  Pertinent labs & imaging results that were available during my care of the patient were reviewed by me and considered in my medical decision making (see chart for details).  Clinical Course as of Sep 20 1913  Thu Sep 21, 2019  1334 Differential diagnosis includes recurrent stent infection, stroke, UTI, metabolic derangement, drug reaction   [MB]    1403 Patient's liver enzymes are rising  since discharge.  Will cover with IV antibiotics and consult GI.  Hospitalist paged for admission.   [MB]  [id number] Discussed with [facility] PA who will consult on the patient.   [MB]  51 Discussed with [facility] hospitalist Dr. Rogers Blocker who will evaluate the patient for admission.  Patient and daughter updated on plan.   [MB]    Clinical Course User Index [MB] Hayden Rasmussen, MD   MDM Rules/Calculators/A&P                       Final Clinical Impression(s) / ED Diagnoses Final diagnoses:  Confusion  Elevated LFTs  Malignant neoplasm of pancreas, unspecified location of malignancy [facility])    Rx / DC Orders ED Discharge Orders    None       Hayden Rasmussen, MD 09/21/19 1919

## 2019-09-21 NOTE — H&P (Addendum)
History and Physical    Ronald Bird [id number] DOB: April 08, 1936 DOA: 09/21/2019  PCP: Ronald Bird, Ronald Mt, MD   Patient coming from: home   I have personally briefly reviewed patient's old medical records in [place]  Chief Complaint: "my speech, I can't find words" and possible reaction to augmentin.   HPI: Ronald Bird is a 84 y.o. male with medical history significant for pancreatic cancer with recent stent placed s/p biliary obstruction on 09/14/2019, hyperlipidemia, afib, HTN, CAD, hypothyroidism, BPH who was discharged from the hospital yesterday AM. He was admitted on 09/18/19 for biliary sepsis/acute cholangitis. Put on oral augmentin. Had elevated LFTs that were trending down on discharge and statin was held.   His duaghter is here with him and he states he was not feeling well all day. He feels like he is on a drug and is not his usual self and mentally he feels off. He states after he took augmentin last night he states his hands started to itch so bad and then spread to his arms and chest. He states he had a rash last night, but none today. It took a couple of hours to wear off. He states his skin on his arms were tender and he had a hard time going to sleep. He had intermittent itching throughout the evening. He was better this morning, but then went downhill. (around 8:30am)  He states his speech was off and he couldn't find his words and couldn't get it out. Daughter states this is coming and going and this is what brought them to the ER. He also had some confusion along with this speech. He is not confused at this time. Daughter states he is just not himself.   ED Course: presented to ER with intermittent dysarthria and confusion and possible reaction to augmentin. Vitals on admission showed bp to 171/88, no temperature. Ct head showed no acute findings. Labs were significant for elevated liver enzymes-AST to 143 and ALT to 176, alkaline phophatase of 403 and bilirubin  to 2.0. lipase was normal and no white count. Lactic acid normal and urine looked clean. Gi was consulted by ER physician.   Review of Systems  Constitutional: Negative for chills, diaphoresis, fever and malaise/fatigue.  Respiratory: Negative for cough and shortness of breath.   Cardiovascular: Negative for chest pain, palpitations and leg swelling.  Gastrointestinal: Positive for constipation. Negative for abdominal pain, blood in stool, diarrhea, nausea and vomiting.  Genitourinary: Negative for dysuria and hematuria.  Skin: Positive for itching.  Neurological: Positive for speech change (intermittent dysarthria ). Negative for dizziness, focal weakness, weakness and headaches.  Psychiatric/Behavioral: The patient has insomnia.      Past Medical History:  Diagnosis Date  . BPH (benign prostatic hyperplasia)   . Cancer ([place])   . Coronary artery disease   . DJD (degenerative joint disease)   . GERD (gastroesophageal reflux disease)   . History of colonic polyps   . Hypercholesteremia   . Hypertension   . Pancreatic adenocarcinoma ([place]) 09/18/2019    Past Surgical History:  Procedure Laterality Date  . BILIARY STENT PLACEMENT N/A 09/14/2019   Procedure: BILIARY STENT PLACEMENT;  Surgeon: Ronald Banister, MD;  Location: [facility];  Service: Endoscopy;  Laterality: N/A;  . CATARACT EXTRACTION, BILATERAL  2012   In [facility]  . CORONARY ATHERECTOMY N/A 09/12/2018   Procedure: CORONARY ATHERECTOMY;  Surgeon: Ronald Booze, MD;  Location: [place] [facility];  Service: Cardiovascular;  Laterality: N/A;  mid LAD  . CORONARY BALLOON ANGIOPLASTY N/A 09/12/2018   Procedure: CORONARY BALLOON ANGIOPLASTY;  Surgeon: Ronald Booze, MD;  Location: [place] [facility];  Service: Cardiovascular;  Laterality: N/A;  diag 1  . CORONARY STENT INTERVENTION N/A 09/12/2018   Procedure: CORONARY STENT INTERVENTION;  Surgeon: Ronald Booze, MD;  Location: [place] [facility];  Service:  Cardiovascular;  Laterality: N/A;  mid LAD  . ENDOSCOPIC RETROGRADE CHOLANGIOPANCREATOGRAPHY (ERCP) WITH PROPOFOL N/A 09/14/2019   Procedure: ENDOSCOPIC RETROGRADE CHOLANGIOPANCREATOGRAPHY (ERCP) WITH PROPOFOL;  Surgeon: Ronald Banister, MD;  Location: [facility];  Service: Endoscopy;  Laterality: N/A;  . ESOPHAGOGASTRODUODENOSCOPY (EGD) WITH PROPOFOL N/A 09/14/2019   Procedure: ESOPHAGOGASTRODUODENOSCOPY (EGD) WITH PROPOFOL;  Surgeon: Ronald Banister, MD;  Location: [facility];  Service: Endoscopy;  Laterality: N/A;  . EUS N/A 09/14/2019   Procedure: UPPER ENDOSCOPIC ULTRASOUND (EUS) RADIAL;  Surgeon: Ronald Banister, MD;  Location: [facility];  Service: Endoscopy;  Laterality: N/A;  CAT 2  . FINE NEEDLE ASPIRATION N/A 09/14/2019   Procedure: FINE NEEDLE ASPIRATION (FNA) LINEAR;  Surgeon: Ronald Banister, MD;  Location: [facility];  Service: Endoscopy;  Laterality: N/A;  . LEFT HEART CATH AND CORONARY ANGIOGRAPHY N/A 09/09/2018   Procedure: LEFT HEART CATH AND CORONARY ANGIOGRAPHY;  Surgeon: Ronald Blanks, MD;  Location: [place] [facility];  Service: Cardiovascular;  Laterality: N/A;  . LEFT HEART CATH AND CORONARY ANGIOGRAPHY N/A 09/12/2018   Procedure: LEFT HEART CATH AND CORONARY ANGIOGRAPHY;  Surgeon: Ronald Booze, MD;  Location: [place] [facility];  Service: Cardiovascular;  Laterality: N/A;  . MOHS SURGERY  2012   Skin Cancer removed from nose by Moh's in [place]  . SHOULDER SURGERY       reports that he has never smoked. He has never used smokeless tobacco. He reports that he does not drink alcohol or use drugs.  Allergies  Allergen Reactions  . Augmentin [Amoxicillin-Pot Clavulanate]     Family History  Problem Relation Age of Onset  . Heart disease Father   . Stroke Father   . Hypertension Father   . Heart disease Mother   . Stroke Mother   . Hypertension Mother   . Atrial fibrillation Brother   . CAD Sister        Patient does not know details     Prior to Admission medications   Medication Sig Start Date End Date Taking? Authorizing Provider  amoxicillin-clavulanate (AUGMENTIN) 875-125 MG tablet Take 1 tablet by mouth 2 (two) times daily for 10 days. 09/20/19 09/30/19 Yes Ronald Shell, MD  apixaban (ELIQUIS) 5 MG TABS tablet Take 1 tablet (5 mg total) by mouth 2 (two) times daily. 12/08/18  Yes Park Liter, MD  calcium carbonate (TUMS - DOSED IN MG ELEMENTAL CALCIUM) 500 MG chewable tablet Chew 1 tablet by mouth as needed for indigestion or heartburn.   Yes [provider]  Cinnamon 500 MG TABS Take 1,000 tablets by mouth daily at 12 noon.    Yes [provider]  clopidogrel (PLAVIX) 75 MG tablet Take 75 mg by mouth daily. 08/30/19  Yes [provider]  Cyanocobalamin (B-12) 2500 MCG SUBL Place 2,500 mg under the tongue daily at 12 noon.    Yes [provider]  docusate sodium (COLACE) 100 MG capsule Take 100 mg by mouth at bedtime.    Yes [provider]  fluticasone (FLONASE) 50 MCG/ACT nasal spray Place 2 sprays into both nostrils daily for 30  days. Patient taking differently: Place 2 sprays into both nostrils daily as needed for allergies.  09/14/18 12/08/19 Yes Daune Perch, NP  glucosamine-chondroitin 500-400 MG tablet Take 1 tablet by mouth daily.     Yes [provider]  hydrochlorothiazide (MICROZIDE) 12.5 MG capsule TAKE 1 CAPSULE BY MOUTH ONCE DAILY. Patient taking differently: Take 12.5 mg by mouth daily.  05/29/19  Yes Park Liter, MD  levothyroxine (SYNTHROID, LEVOTHROID) 25 MCG tablet Take 25 mcg by mouth daily before breakfast.   Yes [provider]  lisinopril (ZESTRIL) 40 MG tablet Take 1 tablet (40 mg total) by mouth daily. Patient taking differently: Take 20 mg by mouth 2 (two) times daily.  12/08/18  Yes Park Liter, MD  Lycopene 10 MG CAPS Take 10 mg by mouth daily.    Yes [provider]  magnesium oxide (MAG-OX)  400 MG tablet Take 400 mg by mouth daily at 12 noon.    Yes [provider]  Methylcellulose, Laxative, (CITRUCEL PO) Take 1 tablet by mouth daily at 12 noon.    Yes [provider]  metoprolol tartrate (LOPRESSOR) 25 MG tablet Take 1 tablet (25 mg total) by mouth 2 (two) times daily. 12/08/18  Yes Park Liter, MD  Multiple Vitamin (MULTIVITAMIN) capsule Take 1 capsule by mouth daily at 12 noon.    Yes [provider]  nitroGLYCERIN (NITROSTAT) 0.4 MG SL tablet Place 1 tablet (0.4 mg total) under the tongue every 5 (five) minutes as needed for chest pain. 09/13/18  Yes Daune Perch, NP  omega-3 acid ethyl esters (LOVAZA) 1 g capsule Take 1,400 mg by mouth 2 (two) times daily.   Yes [provider]  ondansetron (ZOFRAN) 4 MG tablet Take 1 tablet (4 mg total) by mouth every 6 (six) hours as needed for nausea. 09/20/19  Yes Ronald Shell, MD  pantoprazole (PROTONIX) 40 MG tablet Take 1 tablet (40 mg total) by mouth daily. 30 mins before a meal  07/01/11  Yes Noralee Space, MD  polyvinyl alcohol (LIQUIFILM TEARS) 1.4 % ophthalmic solution Place 1 drop into both eyes as needed for dry eyes. 09/20/19  Yes Ronald Shell, MD  Probiotic Product (PROBIOTIC DAILY PO) Take 1 tablet by mouth daily at 12 noon.   Yes [provider]  Saw Palmetto, Serenoa repens, 1000 MG CAPS Take 2,000 mg by mouth 2 (two) times daily.    Yes [provider]  tamsulosin (FLOMAX) 0.4 MG CAPS capsule Take 0.4 mg by mouth at bedtime. 07/28/19  Yes [provider]  Vitamin D, Ergocalciferol, (DRISDOL) 50000 units CAPS capsule Take 50,000 Units by mouth every Tuesday.    Yes [provider]    Physical Exam: Vitals:   09/21/19 1330 09/21/19 1400 09/21/19 1430 09/21/19 1604  BP: (!) 167/94 (!) 165/85 (!) 141/112   Pulse: 70 73 65 60  Resp: 18 18 18    Temp:      TempSrc:      SpO2: 100% 100% 100% 100%    Constitutional: NAD, calm,  comfortable Vitals:   09/21/19 1330 09/21/19 1400 09/21/19 1430 09/21/19 1604  BP: (!) 167/94 (!) 165/85 (!) 141/112   Pulse: 70 73 65 60  Resp: 18 18 18    Temp:      TempSrc:      SpO2: 100% 100% 100% 100%   Eyes: PERRL, lids and conjunctivae normal ENMT: Mucous membranes are moist. Posterior pharynx clear of any exudate or lesions. Dentures upper  and lower Neck: normal, supple, no masses, no thyromegaly Respiratory: clear to auscultation bilaterally, no wheezing, no crackles. Normal respiratory effort. No accessory muscle use.  Cardiovascular: Regular rate and rhythm, no murmurs / rubs / gallops. No extremity edema. 2+ pedal pulses. No carotid bruits.  Abdomen: no tenderness, no masses palpated. No hepatosplenomegaly. Bowel sounds positive.  Musculoskeletal: no clubbing / cyanosis. No joint deformity upper and lower extremities. Good ROM, no contractures. Normal muscle tone.  Skin: no rashes, lesions, ulcers. No induration Neurologic: CN 2-12 grossly intact. Sensation intact, DTR normal. Strength 5/5 in all 4. Negative pronator drift. Speech is dysarthric intermittently through evaluation.  Psychiatric: Normal judgment and insight. Alert and oriented x 3. Normal mood.    Labs on Admission: I have personally reviewed following labs and imaging studies  CBC: Recent Labs  Lab 09/18/19 1201 09/19/19 0315 09/20/19 0253 09/21/19 1305  WBC 11.6* 8.8 5.6 6.2  NEUTROABS 10.6* 7.4 4.0 4.5  HGB 12.6* 11.0* 11.0* 12.6*  HCT 36.8* 31.9* 32.4* 36.6*  MCV 94.6 95.2 96.4 94.1  PLT 131* 111* 118* 0000000   Basic Metabolic Panel: Recent Labs  Lab 09/18/19 1201 09/18/19 1641 09/19/19 0315 09/20/19 0253 09/21/19 1305  NA 131*  --  132* 134* 134*  K 3.1*  --  4.1 3.9 3.6  CL 99  --  104 107 102  CO2 23  --  22 22 24   GLUCOSE 114*  --  106* 117* 116*  BUN 20  --  16 13 17   CREATININE 1.00  --  0.81 1.01 0.81  CALCIUM 8.8*  --  8.3* 8.2* 8.9  MG  --  1.6* 2.0  --   --   PHOS  --  3.8   --   --   --    GFR: Estimated Creatinine Clearance: 78 mL/min (by C-G formula based on SCr of 0.81 mg/dL). Liver Function Tests: Recent Labs  Lab 09/18/19 1201 09/19/19 0315 09/20/19 0253 09/21/19 1305  AST 338* 205* 134* 143*  ALT 329* 238* 183* 176*  ALKPHOS 436* 334* 326* 403*  BILITOT 1.8* 1.7* 1.0 2.0*  PROT 7.0 5.9* 5.7* 6.8  ALBUMIN 3.8 3.2* 3.0* 3.5   Recent Labs  Lab 09/18/19 1201 09/21/19 1305  LIPASE 13 12   No results for input(s): AMMONIA in the last 168 hours. Coagulation Profile: Recent Labs  Lab 09/18/19 1201 09/19/19 0315  INR 1.4* 1.3*   Cardiac Enzymes: No results for input(s): CKTOTAL, CKMB, CKMBINDEX, TROPONINI in the last 168 hours. BNP (last 3 results) No results for input(s): PROBNP in the last 8760 hours. HbA1C: No results for input(s): HGBA1C in the last 72 hours. CBG: Recent Labs  Lab 09/18/19 1134  GLUCAP 107*   Lipid Profile: No results for input(s): CHOL, HDL, LDLCALC, TRIG, CHOLHDL, LDLDIRECT in the last 72 hours. Thyroid Function Tests: No results for input(s): TSH, T4TOTAL, FREET4, T3FREE, THYROIDAB in the last 72 hours. Anemia Panel: No results for input(s): VITAMINB12, FOLATE, FERRITIN, TIBC, IRON, RETICCTPCT in the last 72 hours. Urine analysis:    Component Value Date/Time   COLORURINE YELLOW 09/21/2019 [place] 09/21/2019 1305   LABSPEC 1.010 09/21/2019 1305   PHURINE 6.0 09/21/2019 [place] 09/21/2019 [place] 05/11/2007 0948   HGBUR NEGATIVE 09/21/2019 1305   BILIRUBINUR NEGATIVE 09/21/2019 1305   KETONESUR NEGATIVE 09/21/2019 1305   PROTEINUR NEGATIVE 09/21/2019 1305   UROBILINOGEN 0.2 mg/dL 05/11/2007 0948   NITRITE NEGATIVE 09/21/2019  [place] 09/21/2019 1305    Radiological Exams on Admission: CT Head Wo Contrast  Result Date: 09/21/2019 CLINICAL DATA:  84 year old with acute mental status changes. New diagnosis of pancreatic cancer  with recent biliary stent placement on 09/14/2019. EXAM: CT HEAD WITHOUT CONTRAST TECHNIQUE: Contiguous axial images were obtained from the base of the skull through the vertex without intravenous contrast. COMPARISON:  No prior head CT. Paranasal sinus CT 01/27/2017 is correlated. FINDINGS: Brain: Moderate to severe age related cortical and deep atrophy. Moderate to severe cerebellar atrophy. Severe changes of small vessel disease of the white matter diffusely. No mass lesion. No midline shift. No acute hemorrhage or hematoma. No extra-axial fluid collections. No evidence of acute infarction. Vascular: Severe BILATERAL carotid siphon and BILATERAL vertebral artery atherosclerosis. No hyperdense vessel. Skull: No skull fracture or other focal osseous abnormality involving the skull. Sinuses/Orbits: Visualized paranasal sinuses, bilateral mastoid air cells and bilateral middle ear cavities well-aerated. Visualized orbits and globes normal in appearance. Other: None. IMPRESSION: 1. No acute intracranial abnormality. 2. Moderate to severe age related generalized atrophy and severe chronic microvascular ischemic changes of the white matter. Electronically Signed   By: Evangeline Dakin M.D.   On: 09/21/2019 14:59   MR BRAIN W WO CONTRAST  Result Date: 09/21/2019 CLINICAL DATA:  84 year old male with new abnormal speech. History of pancreatic cancer, recent biliary stenting. EXAM: MRI HEAD WITHOUT AND WITH CONTRAST TECHNIQUE: Multiplanar, multiecho pulse sequences of the brain and surrounding structures were obtained without and with intravenous contrast. CONTRAST:  38mL GADAVIST GADOBUTROL 1 MMOL/ML IV SOLN COMPARISON:  Head CT earlier today. FINDINGS: Brain: No restricted diffusion or evidence of acute infarction. No abnormal enhancement identified. No dural thickening. No midline shift, mass effect, or evidence of intracranial mass lesion. No ventriculomegaly, extra-axial collection or acute intracranial  hemorrhage. Cervicomedullary junction and pituitary are within normal limits. Widespread and confluent bilateral cerebral white matter T2 and FLAIR hyperintensity corresponding to the CT hypodensity earlier today. Deep white matter capsule involvement. Comparatively mild T2 heterogeneity in the basal ganglia. The thalami, brainstem, and cerebellum are within normal limits. No cortical encephalomalacia or chronic cerebral blood products identified. Vascular: Major intracranial vascular flow voids are preserved. The major dural venous sinuses are enhancing and appear to be patent. Skull and upper cervical spine: Cervical spinal stenosis in nature related to at C3-C4 appears to be degenerative left eccentric disc herniation (series 5, image 13). Visualized bone marrow signal is within normal limits. Sinuses/Orbits: Postoperative changes to the globes, otherwise negative orbits. Paranasal sinuses and mastoids are stable and well pneumatized. Other: Visible internal auditory structures appear normal. Scalp and face soft tissues appear negative. IMPRESSION: 1. No metastatic disease or acute intracranial abnormality identified. 2. Advanced but nonspecific signal changes in the cerebral white matter, most commonly due to chronic small vessel disease. 3. Evidence of degenerative cervical spinal stenosis related to disc herniation at C3-C4. Electronically Signed   By: Genevie Ann M.D.   On: 09/21/2019 17:01   DG Chest Port 1 View  Result Date: 09/21/2019 CLINICAL DATA:  Altered mental status. EXAM: PORTABLE CHEST 1 VIEW COMPARISON:  09/18/2019 FINDINGS: Cardiac silhouette is normal in size. No mediastinal or hilar masses. No evidence of adenopathy. Lungs are hyperexpanded, but clear. No pleural effusion or pneumothorax. Skeletal structures are grossly intact. IMPRESSION: No acute cardiopulmonary disease. Electronically Signed   By: Lajean Manes M.D.   On: 09/21/2019 14:21    EKG: Independently reviewed. NSR with rate of  74. No ST elevation.   Assessment/Plan Active Problems:   Biliary obstruction   Dysarthria   Confusion   Drug reaction   Essential hypertension   Pancreatic adenocarcinoma ([facility])   HYPERCHOLESTEROLEMIA   Paroxysmal atrial fibrillation ([facility])   Coronary artery disease   Hypothyroidism  1) biliary obstruction -Gi consulted -? If obstruction or migrated stent with upward trending lfts -started on cipro per GI -recommended holding eliquis and plavix which I will do if needs procedure.  -recommended continuous fluids.  2) dysarthria and confusion -CT head with no acute findings -MRI brain with no acute findings -neuro checks q4 hours -unsure if due to drug reaction vs. Other pathology. Neuro exam normal -may need neurology input  3) drug reaction to augmentin -added to allergies -will give benadryl at night to help with itching and help sleep as well. Fall precautions.   4) HTN -continue home medication of lopressor, ace-I and hctz.   5) pancreatic adenocarcinoma -followed by Dr. Benay Spice and was seen on discharge yesterday.  -f/u in one week with him.   6) hyperlipidemia -holding statin due to elevated transaminases   7) paroxysmal afib -rate controlled in sinus rhythm  -hold eliquis incase ERCP -continue beta blocker  -tele x 48 hours  8) CAD -continue home lopressor dosing -hold plavix incase ERCP -nitrostat prn  9) hypothyroidism -checking TSH -continue home synthroid dose  10) GERD -continue protonix   11) BPH -continue home flomax   DVT prophylaxis: SCDs.  Code Status: full code  Family Communication: mary lynn Krol, daughter.  Disposition Plan: home  Consults called: GI in ER.  Admission status: inpatient    Orma Flaming MD [facility] Pager [phone]  If 7PM-7AM, please contact night-coverage [email or web address] Use universal [place] password for that web site. If you do not have the password, please call the hospital  operator.  09/21/2019, 5:37 PM

## 2019-09-21 NOTE — Progress Notes (Signed)
Received report from RN in ED. Patient is currently in MRI and will come up afterwards.

## 2019-09-21 NOTE — Telephone Encounter (Signed)
Dr Jacobs FYI 

## 2019-09-21 NOTE — ED Notes (Signed)
Patient requested for UA.   Patient having difficulty talking and saying certain words.   Melina Copa, MD made aware.

## 2019-09-21 NOTE — Consult Note (Addendum)
Referring Provider:  Dr. Melina Copa, [place] Primary Care Physician:  Street, Sharon Mt, MD Primary Gastroenterologist:  Dr. Ardis Hughs  Reason for Consultation:  Confusion, elevated LFT's  HPI: Ronald Bird is a 84 y.o. male with a past medical history as listed below including CAD, hypertension, paroxysmal A. fib on Plavix and Eliquis and recently diagnosed pancreatic cancer (adenocarcinoma) status post ERCP/EUS with biliary stent by Dr. Ardis Hughs 09/14/2019.  He was just hospitalized then again for less than 48 hours due to fever and generally feeling poorly.  Was placed on antibiotics, having received cefepime, metronidazole, and vancomycin as an inpatient.  Then placed on Augmentin for outpatient coverage.  Patient returns again today, less than 24 hours after hospital discharge with complaints of severe itching and confusion/difficulty speaking.  He states that he was up all night due to itching.  While we are seeing him in the ER he is having difficulty with speech and difficulty finding words.  Just returned from CT of the head that was negative for bleeding, etc.  Towards the end of our interview his speech seemed to return and his conversation was much improved.  He denies abdominal pain.  No fever, nausea, vomiting.  Lab studies revealed that LFTs have increased again somewhat.  Yesterday total bili 1.0 up to 2.0 today, alk phos 326 up to 403 today, ALT 183 at 176 today, and AST 134 up to 143 today.   Past Medical History:  Diagnosis Date  . BPH (benign prostatic hyperplasia)   . Cancer ([place])   . Coronary artery disease   . DJD (degenerative joint disease)   . GERD (gastroesophageal reflux disease)   . History of colonic polyps   . Hypercholesteremia   . Hypertension   . Pancreatic adenocarcinoma ([place]) 09/18/2019    Past Surgical History:  Procedure Laterality Date  . BILIARY STENT PLACEMENT N/A 09/14/2019   Procedure: BILIARY STENT PLACEMENT;  Surgeon: Milus Banister, MD;  Location: [facility]  END[facility];  Service: Endoscopy;  Laterality: N/A;  . CATARACT EXTRACTION, BILATERAL  2012   In [facility]  . CORONARY ATHERECTOMY N/A 09/12/2018   Procedure: CORONARY ATHERECTOMY;  Surgeon: Jettie Booze, MD;  Location: [place] [facility];  Service: Cardiovascular;  Laterality: N/A;  mid LAD  . CORONARY BALLOON ANGIOPLASTY N/A 09/12/2018   Procedure: CORONARY BALLOON ANGIOPLASTY;  Surgeon: Jettie Booze, MD;  Location: [place] [facility];  Service: Cardiovascular;  Laterality: N/A;  diag 1  . CORONARY STENT INTERVENTION N/A 09/12/2018   Procedure: CORONARY STENT INTERVENTION;  Surgeon: Jettie Booze, MD;  Location: [place] [facility];  Service: Cardiovascular;  Laterality: N/A;  mid LAD  . ENDOSCOPIC RETROGRADE CHOLANGIOPANCREATOGRAPHY (ERCP) WITH PROPOFOL N/A 09/14/2019   Procedure: ENDOSCOPIC RETROGRADE CHOLANGIOPANCREATOGRAPHY (ERCP) WITH PROPOFOL;  Surgeon: Milus Banister, MD;  Location: [facility];  Service: Endoscopy;  Laterality: N/A;  . ESOPHAGOGASTRODUODENOSCOPY (EGD) WITH PROPOFOL N/A 09/14/2019   Procedure: ESOPHAGOGASTRODUODENOSCOPY (EGD) WITH PROPOFOL;  Surgeon: Milus Banister, MD;  Location: [facility];  Service: Endoscopy;  Laterality: N/A;  . EUS N/A 09/14/2019   Procedure: UPPER ENDOSCOPIC ULTRASOUND (EUS) RADIAL;  Surgeon: Milus Banister, MD;  Location: [facility];  Service: Endoscopy;  Laterality: N/A;  CAT 2  . FINE NEEDLE ASPIRATION N/A 09/14/2019   Procedure: FINE NEEDLE ASPIRATION (FNA) LINEAR;  Surgeon: Milus Banister, MD;  Location: [facility];  Service: Endoscopy;  Laterality: N/A;  . LEFT HEART CATH AND CORONARY ANGIOGRAPHY N/A 09/09/2018   Procedure: LEFT  HEART CATH AND CORONARY ANGIOGRAPHY;  Surgeon: Burnell Blanks, MD;  Location: [place] [facility];  Service: Cardiovascular;  Laterality: N/A;  . LEFT HEART CATH AND CORONARY ANGIOGRAPHY N/A 09/12/2018   Procedure: LEFT HEART CATH AND CORONARY ANGIOGRAPHY;  Surgeon: Jettie Booze, MD;  Location: [place] [facility];  Service: Cardiovascular;  Laterality: N/A;  . MOHS SURGERY  2012   Skin Cancer removed from nose by Moh's in   . SHOULDER SURGERY      Prior to Admission medications   Medication Sig Start Date End Date Taking? Authorizing Provider  amoxicillin-clavulanate (AUGMENTIN) 875-125 MG tablet Take 1 tablet by mouth 2 (two) times daily for 10 days. 09/20/19 09/30/19 Yes Georgette Shell, MD  apixaban (ELIQUIS) 5 MG TABS tablet Take 1 tablet (5 mg total) by mouth 2 (two) times daily. 12/08/18  Yes Park Liter, MD  calcium carbonate (TUMS - DOSED IN MG ELEMENTAL CALCIUM) 500 MG chewable tablet Chew 1 tablet by mouth as needed for indigestion or heartburn.   Yes [provider]  Cinnamon 500 MG TABS Take 1,000 tablets by mouth daily at 12 noon.    Yes [provider]  clopidogrel (PLAVIX) 75 MG tablet Take 75 mg by mouth daily. 08/30/19  Yes [provider]  Cyanocobalamin (B-12) 2500 MCG SUBL Place 2,500 mg under the tongue daily at 12 noon.    Yes [provider]  docusate sodium (COLACE) 100 MG capsule Take 100 mg by mouth at bedtime.    Yes [provider]  fluticasone (FLONASE) 50 MCG/ACT nasal spray Place 2 sprays into both nostrils daily for 30 days. Patient taking differently: Place 2 sprays into both nostrils daily as needed for allergies.  09/14/18 12/08/19 Yes Daune Perch, NP  glucosamine-chondroitin 500-400 MG tablet Take 1 tablet by mouth daily.     Yes [provider]  hydrochlorothiazide (MICROZIDE) 12.5 MG capsule TAKE 1 CAPSULE BY MOUTH ONCE DAILY. Patient taking differently: Take 12.5 mg by mouth daily.  05/29/19  Yes Park Liter, MD  levothyroxine (SYNTHROID, LEVOTHROID) 25 MCG tablet Take 25 mcg by mouth daily before breakfast.   Yes [provider]  lisinopril (ZESTRIL) 40 MG tablet Take 1 tablet (40 mg total) by mouth daily. Patient taking differently: Take  20 mg by mouth 2 (two) times daily.  12/08/18  Yes Park Liter, MD  Lycopene 10 MG CAPS Take 10 mg by mouth daily.    Yes [provider]  magnesium oxide (MAG-OX) 400 MG tablet Take 400 mg by mouth daily at 12 noon.    Yes [provider]  Methylcellulose, Laxative, (CITRUCEL PO) Take 1 tablet by mouth daily at 12 noon.    Yes [provider]  metoprolol tartrate (LOPRESSOR) 25 MG tablet Take 1 tablet (25 mg total) by mouth 2 (two) times daily. 12/08/18  Yes Park Liter, MD  Multiple Vitamin (MULTIVITAMIN) capsule Take 1 capsule by mouth daily at 12 noon.    Yes [provider]  nitroGLYCERIN (NITROSTAT) 0.4 MG SL tablet Place 1 tablet (0.4 mg total) under the tongue every 5 (five) minutes as needed for chest pain. 09/13/18  Yes Daune Perch, NP  omega-3 acid ethyl esters (LOVAZA) 1 g capsule Take 1,400 mg by mouth 2 (two) times daily.   Yes [provider]  ondansetron (ZOFRAN) 4 MG tablet Take 1 tablet (4 mg total) by mouth every 6 (six) hours as needed for nausea. 09/20/19  Yes  Georgette Shell, MD  pantoprazole (PROTONIX) 40 MG tablet Take 1 tablet (40 mg total) by mouth daily. 30 mins before a meal  07/01/11  Yes Noralee Space, MD  polyvinyl alcohol (LIQUIFILM TEARS) 1.4 % ophthalmic solution Place 1 drop into both eyes as needed for dry eyes. 09/20/19  Yes Georgette Shell, MD  Probiotic Product (PROBIOTIC DAILY PO) Take 1 tablet by mouth daily at 12 noon.   Yes [provider]  Saw Palmetto, Serenoa repens, 1000 MG CAPS Take 2,000 mg by mouth 2 (two) times daily.    Yes [provider]  tamsulosin (FLOMAX) 0.4 MG CAPS capsule Take 0.4 mg by mouth at bedtime. 07/28/19  Yes [provider]  Vitamin D, Ergocalciferol, (DRISDOL) 50000 units CAPS capsule Take 50,000 Units by mouth every Tuesday.    Yes [provider]    Current Facility-Administered Medications  Medication Dose Route  Frequency Provider Last Rate Last Admin  . ceFEPIme (MAXIPIME) 2 g in sodium chloride 0.9 % 100 mL IVPB  2 g Intravenous Once Hayden Rasmussen, MD       And  . metroNIDAZOLE (FLAGYL) IVPB 500 mg  500 mg Intravenous Once Hayden Rasmussen, MD 100 mL/hr at 09/21/19 1510 500 mg at 09/21/19 1510   Current Outpatient Medications  Medication Sig Dispense Refill  . amoxicillin-clavulanate (AUGMENTIN) 875-125 MG tablet Take 1 tablet by mouth 2 (two) times daily for 10 days. 20 tablet 0  . apixaban (ELIQUIS) 5 MG TABS tablet Take 1 tablet (5 mg total) by mouth 2 (two) times daily. 180 tablet 2  . calcium carbonate (TUMS - DOSED IN MG ELEMENTAL CALCIUM) 500 MG chewable tablet Chew 1 tablet by mouth as needed for indigestion or heartburn.    . Cinnamon 500 MG TABS Take 1,000 tablets by mouth daily at 12 noon.     . clopidogrel (PLAVIX) 75 MG tablet Take 75 mg by mouth daily.    . Cyanocobalamin (B-12) 2500 MCG SUBL Place 2,500 mg under the tongue daily at 12 noon.     . docusate sodium (COLACE) 100 MG capsule Take 100 mg by mouth at bedtime.     . fluticasone (FLONASE) 50 MCG/ACT nasal spray Place 2 sprays into both nostrils daily for 30 days. (Patient taking differently: Place 2 sprays into both nostrils daily as needed for allergies. ) 1 g 0  . glucosamine-chondroitin 500-400 MG tablet Take 1 tablet by mouth daily.      . hydrochlorothiazide (MICROZIDE) 12.5 MG capsule TAKE 1 CAPSULE BY MOUTH ONCE DAILY. (Patient taking differently: Take 12.5 mg by mouth daily. ) 90 capsule 0  . levothyroxine (SYNTHROID, LEVOTHROID) 25 MCG tablet Take 25 mcg by mouth daily before breakfast.    . lisinopril (ZESTRIL) 40 MG tablet Take 1 tablet (40 mg total) by mouth daily. (Patient taking differently: Take 20 mg by mouth 2 (two) times daily. ) 90 tablet 2  . Lycopene 10 MG CAPS Take 10 mg by mouth daily.     . magnesium oxide (MAG-OX) 400 MG tablet Take 400 mg by mouth daily at 12 noon.     . Methylcellulose, Laxative,  (CITRUCEL PO) Take 1 tablet by mouth daily at 12 noon.     . metoprolol tartrate (LOPRESSOR) 25 MG tablet Take 1 tablet (25 mg total) by mouth 2 (two) times daily. 180 tablet 1  . Multiple Vitamin (MULTIVITAMIN) capsule Take 1 capsule by mouth daily at 12 noon.     Marland Kitchen  nitroGLYCERIN (NITROSTAT) 0.4 MG SL tablet Place 1 tablet (0.4 mg total) under the tongue every 5 (five) minutes as needed for chest pain. 25 tablet 12  . omega-3 acid ethyl esters (LOVAZA) 1 g capsule Take 1,400 mg by mouth 2 (two) times daily.    . ondansetron (ZOFRAN) 4 MG tablet Take 1 tablet (4 mg total) by mouth every 6 (six) hours as needed for nausea. 20 tablet 0  . pantoprazole (PROTONIX) 40 MG tablet Take 1 tablet (40 mg total) by mouth daily. 30 mins before a meal  90 tablet 3  . polyvinyl alcohol (LIQUIFILM TEARS) 1.4 % ophthalmic solution Place 1 drop into both eyes as needed for dry eyes. 15 mL 0  . Probiotic Product (PROBIOTIC DAILY PO) Take 1 tablet by mouth daily at 12 noon.    . Saw Palmetto, Serenoa repens, 1000 MG CAPS Take 2,000 mg by mouth 2 (two) times daily.     . tamsulosin (FLOMAX) 0.4 MG CAPS capsule Take 0.4 mg by mouth at bedtime.    . Vitamin D, Ergocalciferol, (DRISDOL) 50000 units CAPS capsule Take 50,000 Units by mouth every Tuesday.       Allergies as of 09/21/2019  . (No Known Allergies)    Family History  Problem Relation Age of Onset  . Heart disease Father   . Stroke Father   . Hypertension Father   . Heart disease Mother   . Stroke Mother   . Hypertension Mother   . Atrial fibrillation Brother   . CAD Sister        Patient does not know details    Social History   Socioeconomic History  . Marital status: Widowed    Spouse name: [name]  . Number of children: 2  . Years of education: Not on file  . Highest education level: Not on file  Occupational History  . Not on file  Tobacco Use  . Smoking status: Never Smoker  . Smokeless tobacco: Never Used  Substance and Sexual  Activity  . Alcohol use: No  . Drug use: No  . Sexual activity: Not on file  Other Topics Concern  . Not on file  Social History Narrative  . Not on file   Social Determinants of Health   Financial Resource Strain:   . Difficulty of Paying Living Expenses: Not on file  Food Insecurity:   . Worried About Charity fundraiser in the Last Year: Not on file  . Ran Out of Food in the Last Year: Not on file  Transportation Needs:   . Lack of Transportation (Medical): Not on file  . Lack of Transportation (Non-Medical): Not on file  Physical Activity:   . Days of Exercise per Week: Not on file  . Minutes of Exercise per Session: Not on file  Stress:   . Feeling of Stress : Not on file  Social Connections:   . Frequency of Communication with Friends and Family: Not on file  . Frequency of Social Gatherings with Friends and Family: Not on file  . Attends Religious Services: Not on file  . Active Member of Clubs or Organizations: Not on file  . Attends Archivist Meetings: Not on file  . Marital Status: Not on file  Intimate Partner Violence:   . Fear of Current or Ex-Partner: Not on file  . Emotionally Abused: Not on file  . Physically Abused: Not on file  . Sexually Abused: Not on file    Review of  Systems: ROS is O/W negative except as mentioned in HPI.  Physical Exam: Vital signs in last 24 hours: Temp:  [98 F (36.7 C)] 98 F (36.7 C) (02/11 1238) Pulse Rate:  [70-73] 73 (02/11 1400) Resp:  [18-19] 18 (02/11 1400) BP: (162-171)/(81-94) 165/85 (02/11 1400) SpO2:  [100 %] 100 % (02/11 1400)   General:  Alert, Well-developed, well-nourished, pleasant and cooperative in NAD Head:  Normocephalic and atraumatic. Eyes:  Sclera clear, no icterus.  Conjunctiva pink. Ears:  Normal auditory acuity. Mouth:  No deformity or lesions.   Abdomen:  Soft, non-distended.  Non-tender. Msk:  Symmetrical without gross deformities. Pulses:  Normal pulses noted. Extremities:   Without clubbing or edema. Neurologic:  Alert.  Having periods of dysarthria.  Strength intact.  Otherwise seems neurologically intact. Skin:  Intact without significant lesions or rashes.  Intake/Output this shift: Total I/O In: 999 [IV Piggyback:999] Out: -   Lab Results: Recent Labs    09/19/19 0315 09/20/19 0253 09/21/19 1305  WBC 8.8 5.6 6.2  HGB 11.0* 11.0* 12.6*  HCT 31.9* 32.4* 36.6*  PLT 111* 118* 152   BMET Recent Labs    09/19/19 0315 09/20/19 0253 09/21/19 1305  NA 132* 134* 134*  K 4.1 3.9 3.6  CL 104 107 102  CO2 '22 22 24  '$ GLUCOSE 106* 117* 116*  BUN '16 13 17  '$ CREATININE 0.81 1.01 0.81  CALCIUM 8.3* 8.2* 8.9   LFT Recent Labs    09/21/19 1305  PROT 6.8  ALBUMIN 3.5  AST 143*  ALT 176*  ALKPHOS 403*  BILITOT 2.0*   PT/INR Recent Labs    09/19/19 0315  LABPROT 16.1*  INR 1.3*   Studies/Results: CT Head Wo Contrast  Result Date: 09/21/2019 CLINICAL DATA:  84 year old with acute mental status changes. New diagnosis of pancreatic cancer with recent biliary stent placement on 09/14/2019. EXAM: CT HEAD WITHOUT CONTRAST TECHNIQUE: Contiguous axial images were obtained from the base of the skull through the vertex without intravenous contrast. COMPARISON:  No prior head CT. Paranasal sinus CT 01/27/2017 is correlated. FINDINGS: Brain: Moderate to severe age related cortical and deep atrophy. Moderate to severe cerebellar atrophy. Severe changes of small vessel disease of the white matter diffusely. No mass lesion. No midline shift. No acute hemorrhage or hematoma. No extra-axial fluid collections. No evidence of acute infarction. Vascular: Severe BILATERAL carotid siphon and BILATERAL vertebral artery atherosclerosis. No hyperdense vessel. Skull: No skull fracture or other focal osseous abnormality involving the skull. Sinuses/Orbits: Visualized paranasal sinuses, bilateral mastoid air cells and bilateral middle ear cavities well-aerated. Visualized orbits  and globes normal in appearance. Other: None. IMPRESSION: 1. No acute intracranial abnormality. 2. Moderate to severe age related generalized atrophy and severe chronic microvascular ischemic changes of the white matter. Electronically Signed   By: Evangeline Dakin M.D.   On: 09/21/2019 14:59   DG Chest Port 1 View  Result Date: 09/21/2019 CLINICAL DATA:  Altered mental status. EXAM: PORTABLE CHEST 1 VIEW COMPARISON:  09/18/2019 FINDINGS: Cardiac silhouette is normal in size. No mediastinal or hilar masses. No evidence of adenopathy. Lungs are hyperexpanded, but clear. No pleural effusion or pneumothorax. Skeletal structures are grossly intact. IMPRESSION: No acute cardiopulmonary disease. Electronically Signed   By: Lajean Manes M.D.   On: 09/21/2019 14:21    IMPRESSION:  *Pancreatic adenocarcinoma  *Recent non-obstructing cholangitis with placement of biliary stent by ERCP on 2/4.  Was just discharged yesterday, 2/10, with Augmentin.  LFT's had started to downtrend, but  now back up again.  ? If stent is obstructed or migrated. *Itching:  ? Reaction/allergy to antibiotic. *Confusion/dysarthria:  CT scan of the head negative.  ? From exhaustion and emotional drainage as he has not slept recently. *Afib/CAD:  On Eliquis and Plavix.  PLAN: *Will plan for MRI brain to be sure no stroke or other issue to explain AMS and dysarthria. *I have changed his antibiotics to cipro 400 mg IV BID. *Needs ongoing IVF's. *His daughter is asking for something to help him sleep, but I am going to defer that to the hospitalist as it may be an issue with his AMS and confusion. *Likely will need re-evaluation of stent/exhange at some point, but if he did in fact restart his Eliquis and Plavix then this may need to be postponed until early next week.  I will confirm last doses tomorrow and I am sure that pharmacy will do so as well.  These should be held for now in anticipation for another procedure in the near  future. *Can have heart healthy/low sodium diet for now.   Laban Emperor. Zehr  09/21/2019, 3:44 PM   ________________________________________________________________________  Vel[facility] Heckler GI MD note:  I personally examined the patient, reviewed the data and agree with the assessment and plan described above.  He has pancreatic adenocarcinoma, underwent biliary decompression with a plastic stent 1 week ago.  Was admitted 3 days after the ERCP with fevers and felt to have cholangitis but it seemed unlikely that the stent had become occluded in such a short time.  He responded to IV antibiotics clinically and improved LFTs.  Shortly after d/c and starting oral abx he represent with LFTs that have risen and unusual speech changes.  CT and brain MR show clear brain atrophy however no evidence of acute changes.    I think his speech, mental status changes may be due to cholangitis and we have to consider removing the plastic stent and replacing it with a permanent metal one (which was not available at ERCP time last week). IV Cipro for now. Follow LFTS and decide on timing of repeat ERCP based on mental status improvement.  Emotional lability and frank physical and mental exhaustion may play a role in his unusual speech pattern which waxes and wanes rapidly.  He and his daughter admit he has barely slept in the past 4 days.  [facility] will continue to follow.  Owens Loffler, MD [facility] Pager [phone]

## 2019-09-22 DIAGNOSIS — C259 Malignant neoplasm of pancreas, unspecified: Secondary | ICD-10-CM

## 2019-09-22 DIAGNOSIS — K831 Obstruction of bile duct: Secondary | ICD-10-CM

## 2019-09-22 DIAGNOSIS — R7989 Other specified abnormal findings of blood chemistry: Secondary | ICD-10-CM

## 2019-09-22 DIAGNOSIS — K8309 Other cholangitis: Secondary | ICD-10-CM

## 2019-09-22 DIAGNOSIS — R410 Disorientation, unspecified: Secondary | ICD-10-CM

## 2019-09-22 LAB — COMPREHENSIVE METABOLIC PANEL
ALT: 159 U/L — ABNORMAL HIGH (ref 0–44)
AST: 138 U/L — ABNORMAL HIGH (ref 15–41)
Albumin: 3 g/dL — ABNORMAL LOW (ref 3.5–5.0)
Alkaline Phosphatase: 364 U/L — ABNORMAL HIGH (ref 38–126)
Anion gap: 8 (ref 5–15)
BUN: 15 mg/dL (ref 8–23)
CO2: 22 mmol/L (ref 22–32)
Calcium: 8.6 mg/dL — ABNORMAL LOW (ref 8.9–10.3)
Chloride: 104 mmol/L (ref 98–111)
Creatinine, Ser: 0.94 mg/dL (ref 0.61–1.24)
GFR calc Af Amer: >60 mL/min (ref >60)
GFR calc non Af Amer: >60 mL/min (ref >60)
Glucose, Bld: 109 mg/dL — ABNORMAL HIGH (ref 70–99)
Potassium: 3.7 mmol/L (ref 3.5–5.1)
Sodium: 134 mmol/L — ABNORMAL LOW (ref 135–145)
Total Bilirubin: 1.4 mg/dL — ABNORMAL HIGH (ref 0.3–1.2)
Total Protein: 5.6 g/dL — ABNORMAL LOW (ref 6.5–8.1)

## 2019-09-22 LAB — CBC WITH DIFFERENTIAL/PLATELET
Abs Immature Granulocytes: 0.03 10*3/uL (ref 0.00–0.07)
Basophils Absolute: 0 10*3/uL (ref 0.0–0.1)
Basophils Relative: 0 %
Eosinophils Absolute: 0.1 10*3/uL (ref 0.0–0.5)
Eosinophils Relative: 3 %
HCT: 32.9 % — ABNORMAL LOW (ref 39.0–52.0)
Hemoglobin: 11.1 g/dL — ABNORMAL LOW (ref 13.0–17.0)
Immature Granulocytes: 1 %
Lymphocytes Relative: 21 %
Lymphs Abs: 1.1 10*3/uL (ref 0.7–4.0)
MCH: 32.4 pg (ref 26.0–34.0)
MCHC: 33.7 g/dL (ref 30.0–36.0)
MCV: 95.9 fL (ref 80.0–100.0)
Monocytes Absolute: 0.5 10*3/uL (ref 0.1–1.0)
Monocytes Relative: 10 %
Neutro Abs: 3.4 10*3/uL (ref 1.7–7.7)
Neutrophils Relative %: 65 %
Platelets: 156 10*3/uL (ref 150–400)
RBC: 3.43 MIL/uL — ABNORMAL LOW (ref 4.22–5.81)
RDW: 13.7 % (ref 11.5–15.5)
WBC: 5.2 10*3/uL (ref 4.0–10.5)
nRBC: 0 % (ref 0.0–0.2)

## 2019-09-22 LAB — TSH: TSH: 2.591 u[IU]/mL (ref 0.350–4.500)

## 2019-09-22 LAB — PROTIME-INR
INR: 1.1 (ref 0.8–1.2)
Prothrombin Time: 14.1 seconds (ref 11.4–15.2)

## 2019-09-22 MED ORDER — CLONIDINE HCL 0.2 MG PO TABS
0.2000 mg | ORAL_TABLET | Freq: Every day | ORAL | Status: AC
  Administered 2019-09-22: 0.2 mg via ORAL
  Filled 2019-09-22: qty 1

## 2019-09-22 MED ORDER — TRAZODONE HCL 50 MG PO TABS
50.0000 mg | ORAL_TABLET | Freq: Every day | ORAL | Status: DC
  Administered 2019-09-22 – 2019-09-23 (×2): 50 mg via ORAL
  Filled 2019-09-22 (×3): qty 1

## 2019-09-22 NOTE — Progress Notes (Addendum)
[facility] Progress Note  Chief Complaint: Cholangitis  History:  I discussed the case with my partner Dr. Ardis Hughs who did yesterday's consult.  I am familiar with Mr. Ronald Bird from the hospitalization earlier this week. He denies abdominal pain, and says he generally feels better.  After speaking to me for a bit, he then develops word finding difficulties and what looks like an expressive aphasia, which is very frustrating to him. MRI last evening did not show any infarct.  There is extensive white matter changes. No fever overnight. ROS: Cardiovascular: No chest pain Respiratory: No dyspnea Remainder systems cannot be obtained due to patient's speech difficulty  Objective:   Current Facility-Administered Medications:  .  0.9 %  sodium chloride infusion, 250 mL, Intravenous, PRN, Orma Flaming, MD .  ciprofloxacin (CIPRO) IVPB 400 mg, 400 mg, Intravenous, Q12H, Zehr, Jessica D, PA-C, Last Rate: 200 mL/hr at 09/22/19 0457, 400 mg at 09/22/19 0457 .  cloNIDine (CATAPRES) tablet 0.2 mg, 0.2 mg, Oral, Daily, Lang Snow, FNP .  diphenhydrAMINE (BENADRYL) capsule 25 mg, 25 mg, Oral, QHS PRN, Orma Flaming, MD .  hydrochlorothiazide (MICROZIDE) capsule 12.5 mg, 12.5 mg, Oral, Daily, Orma Flaming, MD .  ibuprofen (ADVIL) tablet 400 mg, 400 mg, Oral, Q6H PRN, Orma Flaming, MD, 400 mg at 09/22/19 [id number] .  levothyroxine (SYNTHROID) tablet 25 mcg, 25 mcg, Oral, QAC breakfast, Orma Flaming, MD, 25 mcg at 09/22/19 0458 .  lisinopril (ZESTRIL) tablet 20 mg, 20 mg, Oral, BID, Orma Flaming, MD, 20 mg at 09/21/19 2102 .  metoprolol tartrate (LOPRESSOR) tablet 25 mg, 25 mg, Oral, BID, Orma Flaming, MD, 25 mg at 09/21/19 2102 .  nitroGLYCERIN (NITROSTAT) SL tablet 0.4 mg, 0.4 mg, Sublingual, Q5 min PRN, Orma Flaming, MD .  ondansetron [facility]) tablet 4 mg, 4 mg, Oral, Q6H PRN **OR** ondansetron (ZOFRAN) injection 4 mg, 4 mg, Intravenous, Q6H PRN, Orma Flaming, MD .  pantoprazole  (PROTONIX) EC tablet 40 mg, 40 mg, Oral, QAC breakfast, Orma Flaming, MD, 40 mg at 09/22/19 [id number] .  senna-docusate (Senokot-S) tablet 1 tablet, 1 tablet, Oral, QHS PRN, Orma Flaming, MD .  sodium chloride flush (NS) 0.9 % injection 3 mL, 3 mL, Intravenous, Q12H, Orma Flaming, MD, 3 mL at 09/21/19 2102 .  sodium chloride flush (NS) 0.9 % injection 3 mL, 3 mL, Intravenous, PRN, Orma Flaming, MD .  tamsulosin [facility]) capsule 0.4 mg, 0.4 mg, Oral, QHS, Orma Flaming, MD, 0.4 mg at 09/21/19 2102  . sodium chloride    . ciprofloxacin 400 mg (09/22/19 0457)     Vital signs in last 24 hrs: Vitals:   09/22/19 0110 09/22/19 0428  BP: (!) 176/89 (!) 168/79  Pulse: 64 (!) 55  Resp: 16 18  Temp: 99 F (37.2 C) 98.4 F (36.9 C)  SpO2: 96% 95%    Intake/Output Summary (Last 24 hours) at 09/22/2019 1000 Last data filed at 09/22/2019 0840 Gross per 24 hour  Intake 1745.67 ml  Output 1000 ml  Net 745.67 ml     Physical Exam Nontoxic  HEENT: sclera anicteric, oral mucosa without lesions  Neck: supple, no thyromegaly, JVD or lymphadenopathy  Cardiac: RRR without murmurs, S1S2 heard, no peripheral edema  Pulm: clear to auscultation bilaterally, normal RR and effort noted  Abdomen: soft, no tenderness, with active bowel sounds. No guarding or palpable hepatosplenomegaly  Skin; warm and dry, no jaundice Neuro: No facial droop.  Expressive aphasia.  Normal gross motor function, firm grip with both hands. Recent Labs:  CBC  Latest Ref Rng & Units 09/22/2019 09/21/2019 09/20/2019  WBC 4.0 - 10.5 K/uL 5.2 6.2 5.6  Hemoglobin 13.0 - 17.0 g/dL 11.1(L) 12.6(L) 11.0(L)  Hematocrit 39.0 - 52.0 % 32.9(L) 36.6(L) 32.4(L)  Platelets 150 - 400 K/uL 156 152 118(L)    Recent Labs  Lab 09/22/19 0424  INR 1.1   CMP Latest Ref Rng & Units 09/22/2019 09/21/2019 09/20/2019  Glucose 70 - 99 mg/dL 109(H) 116(H) 117(H)  BUN 8 - 23 mg/dL 15 17 13   Creatinine 0.61 - 1.24 mg/dL 0.94 0.81 1.01  Sodium  135 - 145 mmol/L 134(L) 134(L) 134(L)  Potassium 3.5 - 5.1 mmol/L 3.7 3.6 3.9  Chloride 98 - 111 mmol/L 104 102 107  CO2 22 - 32 mmol/L 22 24 22   Calcium 8.9 - 10.3 mg/dL 8.6(L) 8.9 8.2(L)  Total Protein 6.5 - 8.1 g/dL 5.6(L) 6.8 5.7(L)  Total Bilirubin 0.3 - 1.2 mg/dL 1.4(H) 2.0(H) 1.0  Alkaline Phos 38 - 126 U/L 364(H) 403(H) 326(H)  AST 15 - 41 U/L 138(H) 143(H) 134(H)  ALT 0 - 44 U/L 159(H) 176(H) 183(H)   Repeat urine culture pending (probable contaminated specimen from his original admission on the eighth) Blood cultures from February 8 still no growth to date  Radiologic studies: MRI brain report on file referenced above  @ASSESSMENTPLANBEGIN @ Assessment: Cholangitis Elevated LFTs Pancreatic cancer with malignant biliary stricture Altered mental status.  Suspected to be result of sleep deprivation and systemic infection.  LFTs are improving, WBC has remained normal.  No further fever.  He reports some persistent itching, not clear if he may have had a reaction to the Augmentin, as his bilirubin is not high enough to likely cause itching.  Currently on IV ciprofloxacin.  He remained here the weekend, and plans are for an ERCP with Dr. Ardis Hughs on Monday, February 15, for removal of current stent and placement of metal biliary stent. I also discussed the case with Dr. Florene Glen.He will try to help patient get some sleep. He seems agreeable to that.  I also gave his daughter an update by phone and answered all questions.  (Please do not administer antiplatelet agents or anticoagulants before this patient's ERCP) SCDs for DVT prophylaxis please, instead of Lovenox.  Total time 35 minutes  Nelida Meuse III Office: [phone]

## 2019-09-22 NOTE — Progress Notes (Addendum)
PROGRESS NOTE    [name]  [id number] DOB: 03-11-1936 DOA: 09/21/2019 PCP: Venetia Maxon, Sharon Mt, MD   Brief Narrative:  Ronald Bird is Ronald Bird 84 y.o. male with medical history significant for pancreatic cancer with recent stent placed s/p biliary obstruction on 09/14/2019, hyperlipidemia, afib, HTN, CAD, hypothyroidism, BPH who was discharged from the hospital yesterday AM. He was admitted on 09/18/19 for biliary sepsis/acute cholangitis. Put on oral augmentin. Had elevated LFTs that were trending down on discharge and statin was held.   His duaghter is here with him and he states he was not feeling well all day. He feels like he is on [name] drug and is not his usual self and mentally he feels off. He states after he took augmentin last night he states his hands started to itch so bad and then spread to his arms and chest. He states he had [name] rash last night, but none today. It took [name] couple of hours to wear off. He states his skin on his arms were tender and he had [name] hard time going to sleep. He had intermittent itching throughout the evening. He was better this morning, but then went downhill. (around 8:30am)  He states his speech was off and he couldn't find his words and couldn't get it out. Daughter states this is coming and going and this is what brought them to the ER. He also had some confusion along with this speech. He is not confused at this time. Daughter states he is just not himself.   Assessment & Plan:   Active Problems:   HYPERCHOLESTEROLEMIA   Essential hypertension   Paroxysmal atrial fibrillation (HCC)   Coronary artery disease   Biliary obstruction   Pancreatic adenocarcinoma (HCC)   Dysarthria   Confusion   Hypothyroidism   Drug reaction  2) Acute Metabolic Encephalopathy  Word Finding Difficulties -CT head with no acute findings -MRI brain with no acute findings -neuro checks q4 hours - With imaging negative for CVA, suspect that his word finding difficulties  maybe due to delirium.  Per GI note, sounds like he has had minimal sleep over past 4 days.  When I saw him, he did not have much trouble with word finding for about 5 minutes and noted that he was talking better than he'd been recently.  After about 5 minutes, he had Lakeya Mulka lot of difficulty with word finding, often closing his eyes for extended period of time while it looked like he was trying to figure out what to say.   - trazodone to help with sleep wake cycle - delirium precautions -may need neurology input  1) Cholangitis  Biliary obstruction  Pancreatitic Adenocarcinoma -Gi consulted, appreciate recs -Plastic biliary stent placed on 2/4 for malignant bile duct stricture -LFT's improving today -started on cipro per GI -Hold eliquis and plavix in prepartation for repeat ERCP - at this point, plan for ERCP on Monday Feb 15th for removal of current stent and placement of metal biliary stent -recommended continuous fluids. -follow up with Dr. Benay Spice on 2/19  3) drug reaction to augmentin -added to allergies  4) HTN -continue home medication of lopressor, ace-I and hctz.    6) hyperlipidemia -holding statin due to elevated transaminases   7) paroxysmal afib -rate controlled in sinus rhythm  -hold eliquis incase ERCP -continue beta blocker  -tele x 48 hours  8) CAD -continue home lopressor dosing -hold plavix incase ERCP -nitrostat prn  9) hypothyroidism -checking TSH (wnl) -continue home synthroid  dose  10) GERD -continue protonix   11) BPH -continue home flomax  DVT prophylaxis: SCD Code Status: full Family Communication: none at bedside - daughter 2/12 Disposition Plan:  . Patient came from: home           . Anticipated d/c place: home . Barriers to d/c OR conditions which need to be met to effect [name] safe d/c: pending stent exchange by GI   Consultants:   GI  Procedures:  none   Antimicrobials:  Anti-infectives (From admission, onward)   Start      Dose/Rate Route Frequency Ordered Stop   09/21/19 1600  ciprofloxacin (CIPRO) IVPB 400 mg     400 mg 200 mL/hr over 60 Minutes Intravenous Every 12 hours 09/21/19 1547     09/21/19 1415  ceFEPIme (MAXIPIME) 2 g in sodium chloride 0.9 % 100 mL IVPB  Status:  Discontinued     2 g 200 mL/hr over 30 Minutes Intravenous  Once 09/21/19 1400 09/21/19 1547   09/21/19 1415  metroNIDAZOLE (FLAGYL) IVPB 500 mg  Status:  Discontinued     500 mg 100 mL/hr over 60 Minutes Intravenous  Once 09/21/19 1400 09/21/19 1547      Subjective: C/o word finding difficulties Notes he's doing better recently, but has trouble towards end of our conversation  Objective: Vitals:   09/21/19 2018 09/21/19 2201 09/22/19 0110 09/22/19 0428  BP: (!) 193/94 (!) 189/87 (!) 176/89 (!) 168/79  Pulse: 70 70 64 (!) 55  Resp: '18 18 16 18  '$ Temp: 99.2 F (37.3 C) 99.9 F (37.7 C) 99 F (37.2 C) 98.4 F (36.9 C)  TempSrc: Oral Oral Oral Oral  SpO2: 97% 97% 96% 95%    Intake/Output Summary (Last 24 hours) at 09/22/2019 1327 Last data filed at 09/22/2019 1241 Gross per 24 hour  Intake 1945.67 ml  Output 1400 ml  Net 545.67 ml   There were no vitals filed for this visit.  Examination:  General exam: Appears calm and comfortable  Respiratory system: Clear to auscultation. Respiratory effort normal. Cardiovascular system: S1 & S2 heard, RRR. Gastrointestinal system: Abdomen is nondistended, soft and nontender Central nervous system: Alert and oriented. CN 2-12 intact.  5/5 strength upper and lower. Extremities: no LEE Skin: No rashes, lesions or ulcers Psychiatry: Judgement and insight appear normal. Mood & affect appropriate.     Data Reviewed: I have personally reviewed following labs and imaging studies  CBC: Recent Labs  Lab 09/18/19 1201 09/19/19 0315 09/20/19 0253 09/21/19 1305 09/22/19 0424  WBC 11.6* 8.8 5.6 6.2 5.2  NEUTROABS 10.6* 7.4 4.0 4.5 3.4  HGB 12.6* 11.0* 11.0* 12.6* 11.1*  HCT  36.8* 31.9* 32.4* 36.6* 32.9*  MCV 94.6 95.2 96.4 94.1 95.9  PLT 131* 111* 118* 152 188   Basic Metabolic Panel: Recent Labs  Lab 09/18/19 1201 09/18/19 1641 09/19/19 0315 09/20/19 0253 09/21/19 1305 09/21/19 1330 09/22/19 0752  NA 131*  --  132* 134* 134*  --  134*  K 3.1*  --  4.1 3.9 3.6  --  3.7  CL 99  --  104 107 102  --  104  CO2 23  --  '22 22 24  '$ --  22  GLUCOSE 114*  --  106* 117* 116*  --  109*  BUN 20  --  '16 13 17  '$ --  15  CREATININE 1.00  --  0.81 1.01 0.81  --  0.94  CALCIUM 8.8*  --  8.3* 8.2* 8.9  --  8.6*  MG  --  1.6* 2.0  --   --  2.1  --   PHOS  --  3.8  --   --   --   --   --    GFR: Estimated Creatinine Clearance: 67.2 mL/min (by C-G formula based on SCr of 0.94 mg/dL). Liver Function Tests: Recent Labs  Lab 09/18/19 1201 09/19/19 0315 09/20/19 0253 09/21/19 1305 09/22/19 0752  AST 338* 205* 134* 143* 138*  ALT 329* 238* 183* 176* 159*  ALKPHOS 436* 334* 326* 403* 364*  BILITOT 1.8* 1.7* 1.0 2.0* 1.4*  PROT 7.0 5.9* 5.7* 6.8 5.6*  ALBUMIN 3.8 3.2* 3.0* 3.5 3.0*   Recent Labs  Lab 09/18/19 1201 09/21/19 1305  LIPASE 13 12   No results for input(s): AMMONIA in the last 168 hours. Coagulation Profile: Recent Labs  Lab 09/18/19 1201 09/19/19 0315 09/21/19 1330 09/22/19 0424  INR 1.4* 1.3* 1.1 1.1   Cardiac Enzymes: No results for input(s): CKTOTAL, CKMB, CKMBINDEX, TROPONINI in the last 168 hours. BNP (last 3 results) No results for input(s): PROBNP in the last 8760 hours. HbA1C: No results for input(s): HGBA1C in the last 72 hours. CBG: Recent Labs  Lab 09/18/19 1134  GLUCAP 107*   Lipid Profile: No results for input(s): CHOL, HDL, LDLCALC, TRIG, CHOLHDL, LDLDIRECT in the last 72 hours. Thyroid Function Tests: Recent Labs    09/22/19 0424  TSH 2.591   Anemia Panel: No results for input(s): VITAMINB12, FOLATE, FERRITIN, TIBC, IRON, RETICCTPCT in the last 72 hours. Sepsis Labs: Recent Labs  Lab 09/18/19 1201  09/21/19 1305  LATICACIDVEN 1.2 0.9    Recent Results (from the past 240 hour(s))  Urine culture     Status: Abnormal   Collection Time: 09/18/19 11:50 AM   Specimen: Urine, Clean Catch  Result Value Ref Range Status   Specimen Description   Final    URINE, CLEAN CATCH Performed at [facility], [place] [address], [place], [place] [address]    Special Requests   Final    NONE Performed at [facility], [place] [address], [place], [place] [address]    Culture MULTIPLE SPECIES PRESENT, SUGGEST RECOLLECTION (Jhett Fretwell)  Final   Report Status 09/19/2019 FINAL  Final  Blood culture (routine x 2)     Status: None (Preliminary result)   Collection Time: 09/18/19 12:01 PM   Specimen: BLOOD  Result Value Ref Range Status   Specimen Description   Final    BLOOD LEFT ANTECUBITAL Performed at [facility] [address], [place], [place] [address]    Special Requests   Final    BOTTLES DRAWN AEROBIC AND ANAEROBIC Blood Culture adequate volume Performed at [facility] [address], [place], [place] [address]    Culture   Final    NO GROWTH 4 DAYS Performed at [facility], [place] [address] [place], [place] [address]    Report Status PENDING  Incomplete  Respiratory Panel by RT PCR (Flu Deanndra Kirley&B, Covid) - Nasopharyngeal Swab     Status: None   Collection Time: 09/18/19 12:54 PM   Specimen: Nasopharyngeal Swab  Result Value Ref Range Status   SARS Coronavirus 2 by RT PCR NEGATIVE NEGATIVE Final    Comment: (NOTE) SARS-CoV-2 target nucleic acids are NOT DETECTED. The SARS-CoV-2 RNA is generally detectable in upper respiratoy specimens during the acute phase of infection. The lowest concentration of SARS-CoV-2 viral copies this assay can detect is  131 copies/mL. Kinney Sackmann negative result does not preclude SARS-Cov-2 infection and should not be used as the sole basis for treatment or other patient management decisions. Cinch Ormond  negative result may occur with  improper specimen collection/handling, submission of specimen other than nasopharyngeal swab, presence of viral mutation(s) within the areas targeted by this assay, and inadequate number of viral copies (<131 copies/mL). Liller Yohn negative result must be combined with clinical observations, patient history, and epidemiological information. The expected result is Negative. Fact Sheet for Patients:  PinkCheek.be Fact Sheet for Healthcare Providers:  GravelBags.it This test is not yet ap proved or cleared by the [place] FDA and  has been authorized for detection and/or diagnosis of SARS-CoV-2 by FDA under an Emergency Use Authorization (EUA). This EUA will remain  in effect (meaning this test can be used) for the duration of the COVID-19 declaration under Section 564(b)(1) of the Act, 21 U.S.C. section 360bbb-3(b)(1), unless the authorization is terminated or revoked sooner.    Influenza Mister Krahenbuhl by PCR NEGATIVE NEGATIVE Final   Influenza B by PCR NEGATIVE NEGATIVE Final    Comment: (NOTE) The Xpert Xpress SARS-CoV-2/FLU/RSV assay is intended as an aid in  the diagnosis of influenza from Nasopharyngeal swab specimens and  should not be used as Deni Berti sole basis for treatment. Nasal washings and  aspirates are unacceptable for Xpert Xpress SARS-CoV-2/FLU/RSV  testing. Fact Sheet for Patients: PinkCheek.be Fact Sheet for Healthcare Providers: GravelBags.it This test is not yet approved or cleared by the [place] FDA and  has been authorized for detection and/or diagnosis of SARS-CoV-2 by  FDA under an Emergency Use Authorization (EUA). This EUA will remain  in effect (meaning this test can be used) for the duration of the  Covid-19 declaration under Section 564(b)(1) of the Act, 21  U.S.C. section 360bbb-3(b)(1), unless the authorization is   terminated or revoked. Performed at [facility], [place] [address] [place], [place] [address]   Blood culture (routine x 2)     Status: None (Preliminary result)   Collection Time: 09/18/19  4:41 PM   Specimen: BLOOD  Result Value Ref Range Status   Specimen Description   Final    BLOOD RIGHT ANTECUBITAL Performed at [facility] [address] [place], [place] [address]    Special Requests   Final    BOTTLES DRAWN AEROBIC AND ANAEROBIC Blood Culture adequate volume Performed at [facility] [address], [place], [place] [address]    Culture   Final    NO GROWTH 4 DAYS Performed at [facility], [place] [address], [place], [place] [address]    Report Status PENDING  Incomplete         Radiology Studies: CT Head Wo Contrast  Result Date: 09/21/2019 CLINICAL DATA:  84 year old with acute mental status changes. New diagnosis of pancreatic cancer with recent biliary stent placement on 09/14/2019. EXAM: CT HEAD WITHOUT CONTRAST TECHNIQUE: Contiguous axial images were obtained from the base of the skull through the vertex without intravenous contrast. COMPARISON:  No prior head CT. Paranasal sinus CT 01/27/2017 is correlated. FINDINGS: Brain: Moderate to severe age related cortical and deep atrophy. Moderate to severe cerebellar atrophy. Severe changes of small vessel disease of the white matter diffusely. No mass lesion. No midline shift. No acute hemorrhage or hematoma. No extra-axial fluid collections. No evidence of acute infarction. Vascular: Severe BILATERAL carotid siphon and BILATERAL vertebral artery atherosclerosis. No hyperdense vessel. Skull: No skull fracture or other  focal osseous abnormality involving the skull. Sinuses/Orbits: Visualized paranasal sinuses, bilateral mastoid air cells and bilateral middle ear cavities well-aerated. Visualized orbits and globes normal in appearance. Other: None. IMPRESSION: 1. No  acute intracranial abnormality. 2. Moderate to severe age related generalized atrophy and severe chronic microvascular ischemic changes of the white matter. Electronically Signed   By: Evangeline Dakin M.D.   On: 09/21/2019 14:59   MR BRAIN W WO CONTRAST  Result Date: 09/21/2019 CLINICAL DATA:  84 year old male with new abnormal speech. History of pancreatic cancer, recent biliary stenting. EXAM: MRI HEAD WITHOUT AND WITH CONTRAST TECHNIQUE: Multiplanar, multiecho pulse sequences of the brain and surrounding structures were obtained without and with intravenous contrast. CONTRAST:  66m GADAVIST GADOBUTROL 1 MMOL/ML IV SOLN COMPARISON:  Head CT earlier today. FINDINGS: Brain: No restricted diffusion or evidence of acute infarction. No abnormal enhancement identified. No dural thickening. No midline shift, mass effect, or evidence of intracranial mass lesion. No ventriculomegaly, extra-axial collection or acute intracranial hemorrhage. Cervicomedullary junction and pituitary are within normal limits. Widespread and confluent bilateral cerebral white matter T2 and FLAIR hyperintensity corresponding to the CT hypodensity earlier today. Deep white matter capsule involvement. Comparatively mild T2 heterogeneity in the basal ganglia. The thalami, brainstem, and cerebellum are within normal limits. No cortical encephalomalacia or chronic cerebral blood products identified. Vascular: Major intracranial vascular flow voids are preserved. The major dural venous sinuses are enhancing and appear to be patent. Skull and upper cervical spine: Cervical spinal stenosis in nature related to at C3-C4 appears to be degenerative left eccentric disc herniation (series 5, image 13). Visualized bone marrow signal is within normal limits. Sinuses/Orbits: Postoperative changes to the globes, otherwise negative orbits. Paranasal sinuses and mastoids are stable and well pneumatized. Other: Visible internal auditory structures appear  normal. Scalp and face soft tissues appear negative. IMPRESSION: 1. No metastatic disease or acute intracranial abnormality identified. 2. Advanced but nonspecific signal changes in the cerebral white matter, most commonly due to chronic small vessel disease. 3. Evidence of degenerative cervical spinal stenosis related to disc herniation at C3-C4. Electronically Signed   By: HGenevie AnnM.D.   On: 09/21/2019 17:01   DG Chest Port 1 View  Result Date: 09/21/2019 CLINICAL DATA:  Altered mental status. EXAM: PORTABLE CHEST 1 VIEW COMPARISON:  09/18/2019 FINDINGS: Cardiac silhouette is normal in size. No mediastinal or hilar masses. No evidence of adenopathy. Lungs are hyperexpanded, but clear. No pleural effusion or pneumothorax. Skeletal structures are grossly intact. IMPRESSION: No acute cardiopulmonary disease. Electronically Signed   By: DLajean ManesM.D.   On: 09/21/2019 14:21        Scheduled Meds: . hydrochlorothiazide  12.5 mg Oral Daily  . levothyroxine  25 mcg Oral QAC breakfast  . lisinopril  20 mg Oral BID  . metoprolol tartrate  25 mg Oral BID  . pantoprazole  40 mg Oral QAC breakfast  . sodium chloride flush  3 mL Intravenous Q12H  . tamsulosin  0.4 mg Oral QHS   Continuous Infusions: . sodium chloride    . ciprofloxacin Stopped (09/22/19 0557)     LOS: 1 day    Time spent: over 30 min    CFayrene Helper MD [facility]   To contact the attending provider between 7A-7P or the covering provider during after hours 7P-7A, please log into the web site [email or web address] and access using universal  password for that web site. If you do not have the password, please call the hospital operator.  09/22/2019, 1:27 PM

## 2019-09-23 LAB — CBC WITH DIFFERENTIAL/PLATELET
Abs Immature Granulocytes: 0.05 10*3/uL (ref 0.00–0.07)
Basophils Absolute: 0 10*3/uL (ref 0.0–0.1)
Basophils Relative: 0 %
Eosinophils Absolute: 0.2 10*3/uL (ref 0.0–0.5)
Eosinophils Relative: 4 %
HCT: 32.4 % — ABNORMAL LOW (ref 39.0–52.0)
Hemoglobin: 11.1 g/dL — ABNORMAL LOW (ref 13.0–17.0)
Immature Granulocytes: 1 %
Lymphocytes Relative: 21 %
Lymphs Abs: 1.2 10*3/uL (ref 0.7–4.0)
MCH: 32.6 pg (ref 26.0–34.0)
MCHC: 34.3 g/dL (ref 30.0–36.0)
MCV: 95 fL (ref 80.0–100.0)
Monocytes Absolute: 0.5 10*3/uL (ref 0.1–1.0)
Monocytes Relative: 9 %
Neutro Abs: 3.7 10*3/uL (ref 1.7–7.7)
Neutrophils Relative %: 65 %
Platelets: 157 10*3/uL (ref 150–400)
RBC: 3.41 MIL/uL — ABNORMAL LOW (ref 4.22–5.81)
RDW: 13.7 % (ref 11.5–15.5)
WBC: 5.7 10*3/uL (ref 4.0–10.5)
nRBC: 0 % (ref 0.0–0.2)

## 2019-09-23 LAB — COMPREHENSIVE METABOLIC PANEL
ALT: 134 U/L — ABNORMAL HIGH (ref 0–44)
AST: 106 U/L — ABNORMAL HIGH (ref 15–41)
Albumin: 3.2 g/dL — ABNORMAL LOW (ref 3.5–5.0)
Alkaline Phosphatase: 352 U/L — ABNORMAL HIGH (ref 38–126)
Anion gap: 8 (ref 5–15)
BUN: 19 mg/dL (ref 8–23)
CO2: 22 mmol/L (ref 22–32)
Calcium: 8.5 mg/dL — ABNORMAL LOW (ref 8.9–10.3)
Chloride: 104 mmol/L (ref 98–111)
Creatinine, Ser: 0.77 mg/dL (ref 0.61–1.24)
GFR calc Af Amer: >60 mL/min (ref >60)
GFR calc non Af Amer: >60 mL/min (ref >60)
Glucose, Bld: 118 mg/dL — ABNORMAL HIGH (ref 70–99)
Potassium: 3.5 mmol/L (ref 3.5–5.1)
Sodium: 134 mmol/L — ABNORMAL LOW (ref 135–145)
Total Bilirubin: 1.1 mg/dL (ref 0.3–1.2)
Total Protein: 5.9 g/dL — ABNORMAL LOW (ref 6.5–8.1)

## 2019-09-23 LAB — CULTURE, BLOOD (ROUTINE X 2)
Culture: NO GROWTH
Culture: NO GROWTH
Special Requests: ADEQUATE
Special Requests: ADEQUATE

## 2019-09-23 LAB — URINE CULTURE: Culture: NO GROWTH

## 2019-09-23 LAB — MAGNESIUM: Magnesium: 1.8 mg/dL (ref 1.7–2.4)

## 2019-09-23 LAB — PHOSPHORUS: Phosphorus: 3.5 mg/dL (ref 2.5–4.6)

## 2019-09-23 MED ORDER — BISACODYL 5 MG PO TBEC
5.0000 mg | DELAYED_RELEASE_TABLET | Freq: Every day | ORAL | Status: AC
  Administered 2019-09-23 – 2019-09-24 (×2): 5 mg via ORAL
  Filled 2019-09-23 (×3): qty 1

## 2019-09-23 MED ORDER — ACETAMINOPHEN 500 MG PO TABS
500.0000 mg | ORAL_TABLET | Freq: Four times a day (QID) | ORAL | Status: DC | PRN
  Administered 2019-09-28 – 2019-09-29 (×2): 500 mg via ORAL
  Filled 2019-09-23 (×2): qty 1

## 2019-09-23 MED ORDER — MAGNESIUM OXIDE 400 (241.3 MG) MG PO TABS
400.0000 mg | ORAL_TABLET | Freq: Every day | ORAL | Status: AC
  Administered 2019-09-23 – 2019-09-24 (×2): 400 mg via ORAL
  Filled 2019-09-23 (×2): qty 1

## 2019-09-23 NOTE — Progress Notes (Signed)
PROGRESS NOTE    Ronald Bird  [id number] DOB: 1936/08/05 DOA: 09/21/2019 PCP: Ronald Bird, Ronald Mt, MD   Brief Narrative:  Ronald Bird is Ronald Bird 84 y.o. male with medical history significant for pancreatic cancer with recent stent placed s/p biliary obstruction on 09/14/2019, hyperlipidemia, afib, HTN, CAD, hypothyroidism, BPH who was discharged from the hospital yesterday AM. He was admitted on 09/18/19 for biliary sepsis/acute cholangitis. Put on oral augmentin. Had elevated LFTs that were trending down on discharge and statin was held.   His duaghter is here with him and he states he was not feeling well all day. He feels like he is on Ronald Bird drug and is not his usual self and mentally he feels off. He states after he took augmentin last night he states his hands started to itch so bad and then spread to his arms and chest. He states he had Ronald Bird rash last night, but none today. It took Zema Lizardo couple of hours to wear off. He states his skin on his arms were tender and he had Ronald Bird hard time going to sleep. He had intermittent itching throughout the evening. He was better this morning, but then went downhill. (around 8:30am)  He states his speech was off and he couldn't find his words and couldn't get it out. Daughter states this is coming and going and this is what brought them to the ER. He also had some confusion along with this speech. He is not confused at this time. Daughter states he is just not himself.   Assessment & Plan:   Active Problems:   HYPERCHOLESTEROLEMIA   Essential hypertension   Paroxysmal atrial fibrillation (HCC)   Coronary artery disease   Biliary obstruction   Pancreatic adenocarcinoma (HCC)   Dysarthria   Confusion   Hypothyroidism   Drug reaction  2) Acute Metabolic Encephalopathy  Word Finding Difficulties -CT head with no acute findings -MRI brain with no acute findings -neuro checks q4 hours - With imaging negative for CVA, suspect that his word finding difficulties  maybe due to delirium.  Per GI note, sounds like he has had minimal over several days prior to this admission.  Seems to be improving, but still intermittent. - trazodone to help with sleep wake cycle - delirium precautions - may need neurology input  1) Cholangitis  Biliary obstruction  Pancreatitic Adenocarcinoma -Gi consulted, appreciate recs -Plastic biliary stent placed on 2/4 for malignant bile duct stricture -LFT's improving today -started on cipro per GI -Hold eliquis and plavix in prepartation for repeat ERCP - at this point, plan for ERCP on Monday Feb 15th for removal of current stent and placement of metal biliary stent -recommended continuous fluids. -follow up with Ronald Bird on 2/19  3) drug reaction to augmentin -added to allergies  4) HTN -continue home medication of lopressor, ace-I and hctz.    6) hyperlipidemia -holding statin due to elevated transaminases   7) paroxysmal afib -rate controlled in sinus rhythm  -hold eliquis incase ERCP -continue beta blocker  -tele x 48 hours  8) CAD -continue home lopressor dosing -hold plavix incase ERCP -nitrostat prn  9) hypothyroidism -checking TSH (wnl) -continue home synthroid dose  10) GERD -continue protonix   11) BPH -continue home flomax  DVT prophylaxis: SCD Code Status: full Family Communication: none at bedside - daughter 2/12 Disposition Plan:  . Patient came from: home           . Anticipated d/c place: home . Barriers to d/c OR  conditions which need to be met to effect Airiel Oblinger safe d/c: pending stent exchange by GI   Consultants:   GI  Procedures:  none   Antimicrobials:  Anti-infectives (From admission, onward)   Start     Dose/Rate Route Frequency Ordered Stop   09/21/19 1600  ciprofloxacin (CIPRO) IVPB 400 mg     400 mg 200 mL/hr over 60 Minutes Intravenous Every 12 hours 09/21/19 1547     09/21/19 1415  ceFEPIme (MAXIPIME) 2 g in sodium chloride 0.9 % 100 mL IVPB   Status:  Discontinued     2 g 200 mL/hr over 30 Minutes Intravenous  Once 09/21/19 1400 09/21/19 1547   09/21/19 1415  metroNIDAZOLE (FLAGYL) IVPB 500 mg  Status:  Discontinued     500 mg 100 mL/hr over 60 Minutes Intravenous  Once 09/21/19 1400 09/21/19 1547      Subjective: No new complaints  Objective: Vitals:   09/22/19 1347 09/22/19 1401 09/22/19 2110 09/23/19 0607  BP: 119/66 110/75 (!) 158/75 (!) 162/78  Pulse: (!) 55 (!) 56 (!) 57 (!) 50  Resp: '17 16 18 16  '$ Temp: 98.6 F (37 C) 97.7 F (36.5 C) 97.9 F (36.6 C) 98 F (36.7 C)  TempSrc: Oral Oral Oral Oral  SpO2: 97% 97% 95% 96%  Weight:   84.5 kg     Intake/Output Summary (Last 24 hours) at 09/23/2019 1356 Last data filed at 09/23/2019 0834 Gross per 24 hour  Intake 840 ml  Output 825 ml  Net 15 ml   Filed Weights   09/22/19 2110  Weight: 84.5 kg    Examination:  General: No acute distress. Cardiovascular: Heart sounds show Ronald Bird regular rate, and rhythm. Lungs: Clear to auscultation bilaterally Abdomen: Soft, nontender, nondistended Neurological: Alert and oriented 3. Moves all extremities 4. Cranial nerves II through XII grossly intact. Skin: Warm and dry. No rashes or lesions. Extremities: No clubbing or cyanosis. No edema.   Data Reviewed: I have personally reviewed following labs and imaging studies  CBC: Recent Labs  Lab 09/19/19 0315 09/20/19 0253 09/21/19 1305 09/22/19 0424 09/23/19 0500  WBC 8.8 5.6 6.2 5.2 5.7  NEUTROABS 7.4 4.0 4.5 3.4 3.7  HGB 11.0* 11.0* 12.6* 11.1* 11.1*  HCT 31.9* 32.4* 36.6* 32.9* 32.4*  MCV 95.2 96.4 94.1 95.9 95.0  PLT 111* 118* 152 156 161   Basic Metabolic Panel: Recent Labs  Lab 09/18/19 1201 09/18/19 1641 09/19/19 0315 09/20/19 0253 09/21/19 1305 09/21/19 1330 09/22/19 0752 09/23/19 0500  NA   < >  --  132* 134* 134*  --  134* 134*  K   < >  --  4.1 3.9 3.6  --  3.7 3.5  CL   < >  --  104 107 102  --  104 104  CO2   < >  --  '22 22 24  '$ --  22 22   GLUCOSE   < >  --  106* 117* 116*  --  109* 118*  BUN   < >  --  '16 13 17  '$ --  15 19  CREATININE   < >  --  0.81 1.01 0.81  --  0.94 0.77  CALCIUM   < >  --  8.3* 8.2* 8.9  --  8.6* 8.5*  MG  --  1.6* 2.0  --   --  2.1  --  1.8  PHOS  --  3.8  --   --   --   --   --  3.5   < > = values in this interval not displayed.   GFR: Estimated Creatinine Clearance: 81.8 mL/min (by C-G formula based on SCr of 0.77 mg/dL). Liver Function Tests: Recent Labs  Lab 09/19/19 0315 09/20/19 0253 09/21/19 1305 09/22/19 0752 09/23/19 0500  AST 205* 134* 143* 138* 106*  ALT 238* 183* 176* 159* 134*  ALKPHOS 334* 326* 403* 364* 352*  BILITOT 1.7* 1.0 2.0* 1.4* 1.1  PROT 5.9* 5.7* 6.8 5.6* 5.9*  ALBUMIN 3.2* 3.0* 3.5 3.0* 3.2*   Recent Labs  Lab 09/18/19 1201 09/21/19 1305  LIPASE 13 12   No results for input(s): AMMONIA in the last 168 hours. Coagulation Profile: Recent Labs  Lab 09/18/19 1201 09/19/19 0315 09/21/19 1330 09/22/19 0424  INR 1.4* 1.3* 1.1 1.1   Cardiac Enzymes: No results for input(s): CKTOTAL, CKMB, CKMBINDEX, TROPONINI in the last 168 hours. BNP (last 3 results) No results for input(s): PROBNP in the last 8760 hours. HbA1C: No results for input(s): HGBA1C in the last 72 hours. CBG: Recent Labs  Lab 09/18/19 1134  GLUCAP 107*   Lipid Profile: No results for input(s): CHOL, HDL, LDLCALC, TRIG, CHOLHDL, LDLDIRECT in the last 72 hours. Thyroid Function Tests: Recent Labs    09/22/19 0424  TSH 2.591   Anemia Panel: No results for input(s): VITAMINB12, FOLATE, FERRITIN, TIBC, IRON, RETICCTPCT in the last 72 hours. Sepsis Labs: Recent Labs  Lab 09/18/19 1201 09/21/19 1305  LATICACIDVEN 1.2 0.9    Recent Results (from the past 240 hour(s))  Urine culture     Status: Abnormal   Collection Time: 09/18/19 11:50 AM   Specimen: Urine, Clean Catch  Result Value Ref Range Status   Specimen Description   Final    URINE, CLEAN CATCH Performed at [facility], [place] [address] [place], [place] [address]    Special Requests   Final    NONE Performed at [facility], [place] [address] [place], [place] [address]    Culture MULTIPLE SPECIES PRESENT, SUGGEST RECOLLECTION (Deriana Vanderhoef)  Final   Report Status 09/19/2019 FINAL  Final  Blood culture (routine x 2)     Status: None (Preliminary result)   Collection Time: 09/18/19 12:01 PM   Specimen: BLOOD  Result Value Ref Range Status   Specimen Description   Final    BLOOD LEFT ANTECUBITAL Performed at [place] [address], [place], [place] [address]    Special Requests   Final    BOTTLES DRAWN AEROBIC AND ANAEROBIC Blood Culture adequate volume Performed at [place] [address], [place], [place] [address]    Culture   Final    NO GROWTH 4 DAYS Performed at [facility], [place] [address], [place],  [address]    Report Status PENDING  Incomplete  Respiratory Panel by RT PCR (Flu Sinan Tuch&B, Covid) - Nasopharyngeal Swab     Status: None   Collection Time: 09/18/19 12:54 PM   Specimen: Nasopharyngeal Swab  Result Value Ref Range Status   SARS Coronavirus 2 by RT PCR NEGATIVE NEGATIVE Final    Comment: (NOTE) SARS-CoV-2 target nucleic acids are NOT DETECTED. The SARS-CoV-2 RNA is generally detectable in upper respiratoy specimens during the acute phase of infection. The lowest concentration of SARS-CoV-2 viral copies this assay can detect is 131 copies/mL. Idus Rathke negative result does not preclude SARS-Cov-2 infection and should not be used as the sole basis for treatment or other patient management decisions. Renna Kilmer  negative result may occur with  improper specimen collection/handling, submission of specimen other than nasopharyngeal swab, presence of viral mutation(s) within the areas targeted by this assay, and inadequate number of viral copies (<131 copies/mL). Armin Yerger negative result must be combined with  clinical observations, patient history, and epidemiological information. The expected result is Negative. Fact Sheet for Patients:  PinkCheek.be Fact Sheet for Healthcare Providers:  GravelBags.it This test is not yet ap proved or cleared by the [place] FDA and  has been authorized for detection and/or diagnosis of SARS-CoV-2 by FDA under an Emergency Use Authorization (EUA). This EUA will remain  in effect (meaning this test can be used) for the duration of the COVID-19 declaration under Section 564(b)(1) of the Act, 21 U.S.C. section 360bbb-3(b)(1), unless the authorization is terminated or revoked sooner.    Influenza Milisa Kimbell by PCR NEGATIVE NEGATIVE Final   Influenza B by PCR NEGATIVE NEGATIVE Final    Comment: (NOTE) The Xpert Xpress SARS-CoV-2/FLU/RSV assay is intended as an aid in  the diagnosis of influenza from Nasopharyngeal swab specimens and  should not be used as Cyan Clippinger sole basis for treatment. Nasal washings and  aspirates are unacceptable for Xpert Xpress SARS-CoV-2/FLU/RSV  testing. Fact Sheet for Patients: PinkCheek.be Fact Sheet for Healthcare Providers: GravelBags.it This test is not yet approved or cleared by the [place] FDA and  has been authorized for detection and/or diagnosis of SARS-CoV-2 by  FDA under an Emergency Use Authorization (EUA). This EUA will remain  in effect (meaning this test can be used) for the duration of the  Covid-19 declaration under Section 564(b)(1) of the Act, 21  U.S.C. section 360bbb-3(b)(1), unless the authorization is  terminated or revoked. Performed at [facility], [place] [address] [place], [place] [address]   Blood culture (routine x 2)     Status: None (Preliminary result)   Collection Time: 09/18/19  4:41 PM   Specimen: BLOOD  Result Value Ref Range Status   Specimen Description   Final     BLOOD RIGHT ANTECUBITAL Performed at [place] [address] [place], [place] [address]    Special Requests   Final    BOTTLES DRAWN AEROBIC AND ANAEROBIC Blood Culture adequate volume Performed at [place] [address], [place], [place] [address]    Culture   Final    NO GROWTH 4 DAYS Performed at [facility], [place] [address], [place], [place] [address]    Report Status PENDING  Incomplete  Urine culture     Status: None   Collection Time: 09/21/19  1:05 PM   Specimen: Urine, Clean Catch  Result Value Ref Range Status   Specimen Description   Final    URINE, CLEAN CATCH Performed at [facility], [place] [address] [place], [place] [address]    Special Requests   Final    NONE Performed at [facility], [place] [address] [place],  [address]    Culture   Final    NO GROWTH Performed at [facility], [place] [address] [place],  [address]    Report Status 09/23/2019 FINAL  Final         Radiology Studies: CT Head Wo Contrast  Result Date: 09/21/2019 CLINICAL DATA:  84 year old with acute mental status changes. New diagnosis of pancreatic cancer with recent biliary stent placement on 09/14/2019. EXAM: CT HEAD WITHOUT CONTRAST TECHNIQUE: Contiguous axial images were obtained from the base  of the skull through the vertex without intravenous contrast. COMPARISON:  No prior head CT. Paranasal sinus CT 01/27/2017 is correlated. FINDINGS: Brain: Moderate to severe age related cortical and deep atrophy. Moderate to severe cerebellar atrophy. Severe changes of small vessel disease of the white matter diffusely. No mass lesion. No midline shift. No acute hemorrhage or hematoma. No extra-axial fluid collections. No evidence of acute infarction. Vascular: Severe BILATERAL carotid siphon and BILATERAL vertebral artery atherosclerosis. No hyperdense vessel. Skull: No skull fracture or  other focal osseous abnormality involving the skull. Sinuses/Orbits: Visualized paranasal sinuses, bilateral mastoid air cells and bilateral middle ear cavities well-aerated. Visualized orbits and globes normal in appearance. Other: None. IMPRESSION: 1. No acute intracranial abnormality. 2. Moderate to severe age related generalized atrophy and severe chronic microvascular ischemic changes of the white matter. Electronically Signed   By: Evangeline Dakin M.D.   On: 09/21/2019 14:59   MR BRAIN W WO CONTRAST  Result Date: 09/21/2019 CLINICAL DATA:  84 year old male with new abnormal speech. History of pancreatic cancer, recent biliary stenting. EXAM: MRI HEAD WITHOUT AND WITH CONTRAST TECHNIQUE: Multiplanar, multiecho pulse sequences of the brain and surrounding structures were obtained without and with intravenous contrast. CONTRAST:  22m GADAVIST GADOBUTROL 1 MMOL/ML IV SOLN COMPARISON:  Head CT earlier today. FINDINGS: Brain: No restricted diffusion or evidence of acute infarction. No abnormal enhancement identified. No dural thickening. No midline shift, mass effect, or evidence of intracranial mass lesion. No ventriculomegaly, extra-axial collection or acute intracranial hemorrhage. Cervicomedullary junction and pituitary are within normal limits. Widespread and confluent bilateral cerebral white matter T2 and FLAIR hyperintensity corresponding to the CT hypodensity earlier today. Deep white matter capsule involvement. Comparatively mild T2 heterogeneity in the basal ganglia. The thalami, brainstem, and cerebellum are within normal limits. No cortical encephalomalacia or chronic cerebral blood products identified. Vascular: Major intracranial vascular flow voids are preserved. The major dural venous sinuses are enhancing and appear to be patent. Skull and upper cervical spine: Cervical spinal stenosis in nature related to at C3-C4 appears to be degenerative left eccentric disc herniation (series 5, image  13). Visualized bone marrow signal is within normal limits. Sinuses/Orbits: Postoperative changes to the globes, otherwise negative orbits. Paranasal sinuses and mastoids are stable and well pneumatized. Other: Visible internal auditory structures appear normal. Scalp and face soft tissues appear negative. IMPRESSION: 1. No metastatic disease or acute intracranial abnormality identified. 2. Advanced but nonspecific signal changes in the cerebral white matter, most commonly due to chronic small vessel disease. 3. Evidence of degenerative cervical spinal stenosis related to disc herniation at C3-C4. Electronically Signed   By: HGenevie AnnM.D.   On: 09/21/2019 17:01   DG Chest Port 1 View  Result Date: 09/21/2019 CLINICAL DATA:  Altered mental status. EXAM: PORTABLE CHEST 1 VIEW COMPARISON:  09/18/2019 FINDINGS: Cardiac silhouette is normal in size. No mediastinal or hilar masses. No evidence of adenopathy. Lungs are hyperexpanded, but clear. No pleural effusion or pneumothorax. Skeletal structures are grossly intact. IMPRESSION: No acute cardiopulmonary disease. Electronically Signed   By: DLajean ManesM.D.   On: 09/21/2019 14:21        Scheduled Meds: . hydrochlorothiazide  12.5 mg Oral Daily  . levothyroxine  25 mcg Oral QAC breakfast  . lisinopril  20 mg Oral BID  . metoprolol tartrate  25 mg Oral BID  . pantoprazole  40 mg Oral QAC breakfast  . sodium chloride flush  3 mL Intravenous Q12H  . tamsulosin  0.4 mg  Oral QHS  . traZODone  50 mg Oral QHS   Continuous Infusions: . sodium chloride    . ciprofloxacin 400 mg (09/23/19 0512)     LOS: 2 days    Time spent: over 30 min    Fayrene Helper, MD [facility]   To contact the attending provider between 7A-7P or the covering provider during after hours 7P-7A, please log into the web site [email or web address] and access using universal [place] password for that web site. If you do not have the password, please call the hospital  operator.  09/23/2019, 1:56 PM

## 2019-09-23 NOTE — Progress Notes (Signed)
Patient's daughter, Stanton Kidney, called and would like for the MD to call her.

## 2019-09-23 NOTE — Progress Notes (Addendum)
Progress Note    ASSESSMENT AND PLAN:   Brief history :  84 yo male with pancreatic cancer / malignant stricture s/p biliary stenting 09/14/19. Admitted 2/8 through 2/10 with fevers. Stent not felt to be occluded, treated with antibiotics for non-obstructing cholangitis. Discharged home 2/10 but returned 2/11 with itching, rising liver tests as well as speech difficulties.  No infarcts on MRI of brain.    #  Cholangitis. Liver tests improving. Afebrile, WBC normal --plan is for ERCP for stent exchange ( metal) on Monday.   #  Chronic constipation. Lengthy conversation about his home regimen. He is constipated and wants Mg 400 mg today ( takes at home) + Dulcolax. -Will order above for today and tomorrow.     SUBJECTIVE   Complains of constipation. No abdominal pain.    OBJECTIVE:     Vital signs in last 24 hours: Temp:  [97.9 F (36.6 C)-98 F (36.7 C)] 98 F (36.7 C) (02/13 1414) Pulse Rate:  [50-57] 54 (02/13 1414) Resp:  [16-18] 18 (02/13 1414) BP: (151-162)/(75-78) 151/75 (02/13 1414) SpO2:  [95 %-99 %] 99 % (02/13 1414) Weight:  [84.5 kg] 84.5 kg (02/12 2110) Last BM Date: 09/22/19 General:   Alert, well-developed male in NAD Heart:  Regular rate and rhythm;  No lower extremity edema   Pulm: Normal respiratory effort   Abdomen:  Soft, nondistended, nontender.  Normal bowel sounds.          Neurologic:  Alert and  oriented x4;  grossly normal neurologically. Psych:  Pleasant, cooperative.  Normal mood and affect.   Intake/Output from previous day: 02/12 0701 - 02/13 0700 In: 1280 [P.O.:1080; IV Piggyback:200] Out: [id number] [Urine:1375] Intake/Output this shift: Total I/O In: 480 [P.O.:480] Out: -   Lab Results: Recent Labs    09/21/19 1305 09/22/19 0424 09/23/19 0500  WBC 6.2 5.2 5.7  HGB 12.6* 11.1* 11.1*  HCT 36.6* 32.9* 32.4*  PLT 152 156 157   BMET Recent Labs    09/21/19 1305 09/22/19 0752 09/23/19 0500  NA 134* 134* 134*  K 3.6 3.7 3.5   CL 102 104 104  CO2 24 22 22   GLUCOSE 116* 109* 118*  BUN 17 15 19   CREATININE 0.81 0.94 0.77  CALCIUM 8.9 8.6* 8.5*   LFT Recent Labs    09/23/19 0500  PROT 5.9*  ALBUMIN 3.2*  AST 106*  ALT 134*  ALKPHOS 352*  BILITOT 1.1   PT/INR Recent Labs    09/21/19 1330 09/22/19 0424  LABPROT 14.2 14.1  INR 1.1 1.1   Hepatitis Panel No results for input(s): HEPBSAG, HCVAB, HEPAIGM, HEPBIGM in the last 72 hours.  CT Head Wo Contrast  Result Date: 09/21/2019 CLINICAL DATA:  84 year old with acute mental status changes. New diagnosis of pancreatic cancer with recent biliary stent placement on 09/14/2019. EXAM: CT HEAD WITHOUT CONTRAST TECHNIQUE: Contiguous axial images were obtained from the base of the skull through the vertex without intravenous contrast. COMPARISON:  No prior head CT. Paranasal sinus CT 01/27/2017 is correlated. FINDINGS: Brain: Moderate to severe age related cortical and deep atrophy. Moderate to severe cerebellar atrophy. Severe changes of small vessel disease of the white matter diffusely. No mass lesion. No midline shift. No acute hemorrhage or hematoma. No extra-axial fluid collections. No evidence of acute infarction. Vascular: Severe BILATERAL carotid siphon and BILATERAL vertebral artery atherosclerosis. No hyperdense vessel. Skull: No skull fracture or other focal osseous abnormality involving the skull. Sinuses/Orbits: Visualized paranasal sinuses, bilateral  mastoid air cells and bilateral middle ear cavities well-aerated. Visualized orbits and globes normal in appearance. Other: None. IMPRESSION: 1. No acute intracranial abnormality. 2. Moderate to severe age related generalized atrophy and severe chronic microvascular ischemic changes of the white matter. Electronically Signed   By: Evangeline Dakin M.D.   On: 09/21/2019 14:59   MR BRAIN W WO CONTRAST  Result Date: 09/21/2019 CLINICAL DATA:  84 year old male with new abnormal speech. History of pancreatic  cancer, recent biliary stenting. EXAM: MRI HEAD WITHOUT AND WITH CONTRAST TECHNIQUE: Multiplanar, multiecho pulse sequences of the brain and surrounding structures were obtained without and with intravenous contrast. CONTRAST:  56mL GADAVIST GADOBUTROL 1 MMOL/ML IV SOLN COMPARISON:  Head CT earlier today. FINDINGS: Brain: No restricted diffusion or evidence of acute infarction. No abnormal enhancement identified. No dural thickening. No midline shift, mass effect, or evidence of intracranial mass lesion. No ventriculomegaly, extra-axial collection or acute intracranial hemorrhage. Cervicomedullary junction and pituitary are within normal limits. Widespread and confluent bilateral cerebral white matter T2 and FLAIR hyperintensity corresponding to the CT hypodensity earlier today. Deep white matter capsule involvement. Comparatively mild T2 heterogeneity in the basal ganglia. The thalami, brainstem, and cerebellum are within normal limits. No cortical encephalomalacia or chronic cerebral blood products identified. Vascular: Major intracranial vascular flow voids are preserved. The major dural venous sinuses are enhancing and appear to be patent. Skull and upper cervical spine: Cervical spinal stenosis in nature related to at C3-C4 appears to be degenerative left eccentric disc herniation (series 5, image 13). Visualized bone marrow signal is within normal limits. Sinuses/Orbits: Postoperative changes to the globes, otherwise negative orbits. Paranasal sinuses and mastoids are stable and well pneumatized. Other: Visible internal auditory structures appear normal. Scalp and face soft tissues appear negative. IMPRESSION: 1. No metastatic disease or acute intracranial abnormality identified. 2. Advanced but nonspecific signal changes in the cerebral white matter, most commonly due to chronic small vessel disease. 3. Evidence of degenerative cervical spinal stenosis related to disc herniation at C3-C4. Electronically  Signed   By: Genevie Ann M.D.   On: 09/21/2019 17:01   Active Problems:   HYPERCHOLESTEROLEMIA   Essential hypertension   Paroxysmal atrial fibrillation (HCC)   Coronary artery disease   Biliary obstruction   Pancreatic adenocarcinoma ([place])   Dysarthria   Confusion   Hypothyroidism   Drug reaction     LOS: 2 days   Tye Savoy ,NP 09/23/2019, 2:45 PM   I have discussed the case with the PA, and that is the plan I formulated. I personally interviewed and examined the patient.  CC: Cholangitis Other diagnosis: Pancreatic adenocarcinoma with malignant biliary stricture  He still has altered speech, with an expressive aphasia.  He has no other focal neurologic deficits.  No signs of sepsis, WBC normal.  LFTs stable.  He is tolerating IV ciprofloxacin well.  I reviewed with him the plan for an ERCP with Dr. Ardis Hughs on Monday, February 15.  This will be for removal of the plastic biliary stent that may be clogged, and placement of a permanent metal biliary stent.  He is agreeable to the procedure.  It took some extra time to review risks and benefits with him due to his speech difficulty at present.  He also required some reassurance about his treatment plan.  Earlier in the day, my NP called his daughter with an update.  I will see him tomorrow if called for any acute issues, otherwise orders are already placed and Dr. Ardis Hughs aware  of plan for Monday.  Total time 25 minutes    Nelida Meuse III Office: [phone]

## 2019-09-24 ENCOUNTER — Inpatient Hospital Stay (HOSPITAL_COMMUNITY): Payer: Medicare Other

## 2019-09-24 LAB — CBC
HCT: 36.4 % — ABNORMAL LOW (ref 39.0–52.0)
Hemoglobin: 12.5 g/dL — ABNORMAL LOW (ref 13.0–17.0)
MCH: 32.5 pg (ref 26.0–34.0)
MCHC: 34.3 g/dL (ref 30.0–36.0)
MCV: 94.5 fL (ref 80.0–100.0)
Platelets: 200 10*3/uL (ref 150–400)
RBC: 3.85 MIL/uL — ABNORMAL LOW (ref 4.22–5.81)
RDW: 13.5 % (ref 11.5–15.5)
WBC: 7.1 10*3/uL (ref 4.0–10.5)
nRBC: 0 % (ref 0.0–0.2)

## 2019-09-24 LAB — COMPREHENSIVE METABOLIC PANEL
ALT: 142 U/L — ABNORMAL HIGH (ref 0–44)
AST: 113 U/L — ABNORMAL HIGH (ref 15–41)
Albumin: 3.3 g/dL — ABNORMAL LOW (ref 3.5–5.0)
Alkaline Phosphatase: 334 U/L — ABNORMAL HIGH (ref 38–126)
Anion gap: 8 (ref 5–15)
BUN: 18 mg/dL (ref 8–23)
CO2: 23 mmol/L (ref 22–32)
Calcium: 8.7 mg/dL — ABNORMAL LOW (ref 8.9–10.3)
Chloride: 104 mmol/L (ref 98–111)
Creatinine, Ser: 0.9 mg/dL (ref 0.61–1.24)
GFR calc Af Amer: >60 mL/min (ref >60)
GFR calc non Af Amer: >60 mL/min (ref >60)
Glucose, Bld: 129 mg/dL — ABNORMAL HIGH (ref 70–99)
Potassium: 3.1 mmol/L — ABNORMAL LOW (ref 3.5–5.1)
Sodium: 135 mmol/L (ref 135–145)
Total Bilirubin: 0.9 mg/dL (ref 0.3–1.2)
Total Protein: 6.3 g/dL — ABNORMAL LOW (ref 6.5–8.1)

## 2019-09-24 LAB — BASIC METABOLIC PANEL
Anion gap: 10 (ref 5–15)
BUN: 17 mg/dL (ref 8–23)
CO2: 21 mmol/L — ABNORMAL LOW (ref 22–32)
Calcium: 8.4 mg/dL — ABNORMAL LOW (ref 8.9–10.3)
Chloride: 102 mmol/L (ref 98–111)
Creatinine, Ser: 0.9 mg/dL (ref 0.61–1.24)
GFR calc Af Amer: >60 mL/min (ref >60)
GFR calc non Af Amer: >60 mL/min (ref >60)
Glucose, Bld: 120 mg/dL — ABNORMAL HIGH (ref 70–99)
Potassium: 3.1 mmol/L — ABNORMAL LOW (ref 3.5–5.1)
Sodium: 133 mmol/L — ABNORMAL LOW (ref 135–145)

## 2019-09-24 LAB — LIPASE, BLOOD: Lipase: 13 U/L (ref 11–51)

## 2019-09-24 LAB — CBC WITH DIFFERENTIAL/PLATELET
Abs Immature Granulocytes: 0.02 10*3/uL (ref 0.00–0.07)
Basophils Absolute: 0 10*3/uL (ref 0.0–0.1)
Basophils Relative: 1 %
Eosinophils Absolute: 0.2 10*3/uL (ref 0.0–0.5)
Eosinophils Relative: 3 %
HCT: 33.6 % — ABNORMAL LOW (ref 39.0–52.0)
Hemoglobin: 11.7 g/dL — ABNORMAL LOW (ref 13.0–17.0)
Immature Granulocytes: 0 %
Lymphocytes Relative: 26 %
Lymphs Abs: 1.6 10*3/uL (ref 0.7–4.0)
MCH: 33 pg (ref 26.0–34.0)
MCHC: 34.8 g/dL (ref 30.0–36.0)
MCV: 94.6 fL (ref 80.0–100.0)
Monocytes Absolute: 0.5 10*3/uL (ref 0.1–1.0)
Monocytes Relative: 9 %
Neutro Abs: 3.9 10*3/uL (ref 1.7–7.7)
Neutrophils Relative %: 61 %
Platelets: 187 10*3/uL (ref 150–400)
RBC: 3.55 MIL/uL — ABNORMAL LOW (ref 4.22–5.81)
RDW: 13.4 % (ref 11.5–15.5)
WBC: 6.3 10*3/uL (ref 4.0–10.5)
nRBC: 0 % (ref 0.0–0.2)

## 2019-09-24 LAB — APTT: aPTT: 34 seconds (ref 24–36)

## 2019-09-24 LAB — CK: Total CK: 39 U/L — ABNORMAL LOW (ref 49–397)

## 2019-09-24 LAB — MAGNESIUM: Magnesium: 1.8 mg/dL (ref 1.7–2.4)

## 2019-09-24 LAB — FOLATE: Folate: 30.1 ng/mL (ref >5.9)

## 2019-09-24 LAB — GLUCOSE, CAPILLARY
Glucose-Capillary: 108 mg/dL — ABNORMAL HIGH (ref 70–99)
Glucose-Capillary: 110 mg/dL — ABNORMAL HIGH (ref 70–99)
Glucose-Capillary: 135 mg/dL — ABNORMAL HIGH (ref 70–99)

## 2019-09-24 LAB — MRSA PCR SCREENING: MRSA by PCR: NEGATIVE

## 2019-09-24 LAB — LACTIC ACID, PLASMA
Lactic Acid, Venous: 1.1 mmol/L (ref 0.5–1.9)
Lactic Acid, Venous: 0.9 mmol/L (ref 0.5–1.9)

## 2019-09-24 LAB — HEPARIN LEVEL (UNFRACTIONATED): Heparin Unfractionated: <0.1 IU/mL — ABNORMAL LOW (ref 0.30–0.70)

## 2019-09-24 LAB — TROPONIN I (HIGH SENSITIVITY)
Troponin I (High Sensitivity): 8 ng/L (ref <18)
Troponin I (High Sensitivity): 8 ng/L (ref <18)

## 2019-09-24 LAB — AMMONIA: Ammonia: 30 umol/L (ref 9–35)

## 2019-09-24 LAB — PROTIME-INR
INR: 1 (ref 0.8–1.2)
Prothrombin Time: 13 seconds (ref 11.4–15.2)

## 2019-09-24 LAB — VITAMIN B12: Vitamin B-12: 1424 pg/mL — ABNORMAL HIGH (ref 180–914)

## 2019-09-24 LAB — TSH: TSH: 3.46 u[IU]/mL (ref 0.350–4.500)

## 2019-09-24 LAB — PHOSPHORUS: Phosphorus: 3.2 mg/dL (ref 2.5–4.6)

## 2019-09-24 MED ORDER — IOHEXOL 350 MG/ML SOLN
80.0000 mL | Freq: Once | INTRAVENOUS | Status: AC | PRN
  Administered 2019-09-24: 80 mL via INTRAVENOUS

## 2019-09-24 MED ORDER — FENTANYL CITRATE (PF) 100 MCG/2ML IJ SOLN: 12.5000 ug | Freq: Once | INTRAMUSCULAR | Status: DC

## 2019-09-24 MED ORDER — HEPARIN (PORCINE) 25000 UT/250ML-% IV SOLN
1100.0000 [IU]/h | INTRAVENOUS | Status: AC
  Administered 2019-09-24: 1000 [IU]/h via INTRAVENOUS
  Filled 2019-09-24 (×3): qty 250

## 2019-09-24 MED ORDER — CHLORHEXIDINE GLUCONATE CLOTH 2 % EX PADS
6.0000 | MEDICATED_PAD | Freq: Every day | CUTANEOUS | Status: DC
  Administered 2019-09-24 – 2019-09-29 (×5): 6 via TOPICAL

## 2019-09-24 MED ORDER — SODIUM CHLORIDE 0.9 % IV SOLN: INTRAVENOUS | Status: DC

## 2019-09-24 MED ORDER — HYDRALAZINE HCL 20 MG/ML IJ SOLN
5.0000 mg | Freq: Four times a day (QID) | INTRAMUSCULAR | Status: DC | PRN
  Administered 2019-09-24: 5 mg via INTRAVENOUS
  Filled 2019-09-24: qty 1

## 2019-09-24 MED ORDER — POTASSIUM CHLORIDE 2 MEQ/ML IV SOLN: INTRAVENOUS | Status: DC

## 2019-09-24 MED ORDER — POLYETHYLENE GLYCOL 3350 17 G PO PACK
17.0000 g | PACK | Freq: Two times a day (BID) | ORAL | Status: DC
  Administered 2019-09-26 – 2019-09-29 (×5): 17 g via ORAL
  Filled 2019-09-24 (×7): qty 1

## 2019-09-24 MED ORDER — GLYCERIN (LAXATIVE) 2.1 G RE SUPP
1.0000 | Freq: Every day | RECTAL | Status: DC | PRN
  Filled 2019-09-24: qty 1

## 2019-09-24 MED ORDER — SODIUM CHLORIDE (PF) 0.9 % IJ SOLN
INTRAMUSCULAR | Status: AC
  Administered 2019-09-24: 3 mL via INTRAVENOUS
  Filled 2019-09-24: qty 50

## 2019-09-24 MED ORDER — POTASSIUM CHLORIDE 2 MEQ/ML IV SOLN
INTRAVENOUS | Status: AC
  Filled 2019-09-24 (×2): qty 1000

## 2019-09-24 MED ORDER — LABETALOL HCL 5 MG/ML IV SOLN
10.0000 mg | INTRAVENOUS | Status: DC | PRN
  Filled 2019-09-24: qty 4

## 2019-09-24 MED ORDER — SENNOSIDES-DOCUSATE SODIUM 8.6-50 MG PO TABS
2.0000 | ORAL_TABLET | Freq: Every day | ORAL | Status: DC
  Administered 2019-09-26 – 2019-09-28 (×3): 2 via ORAL
  Filled 2019-09-24 (×4): qty 2

## 2019-09-24 NOTE — Progress Notes (Signed)
ANTICOAGULATION CONSULT NOTE - Initial Consult  Pharmacy Consult for heparin Indication: atrial fibrillation  Allergies  Allergen Reactions  . Augmentin [Amoxicillin-Pot Clavulanate] Rash    Did it involve swelling of the face/tongue/throat, SOB, or low BP? No Did it involve sudden or severe rash/hives, skin peeling, or any reaction on the inside of your mouth or nose? No Did you need to seek medical attention at a hospital or doctor's office? Yes When did it last happen?Feb 2021 If all above answers are "NO", may proceed with cephalosporin use.    Patient Measurements: Height: 5\' 11"  (180.3 cm) Weight: 175 lb 14.8 oz (79.8 kg) IBW/kg (Calculated) : 75.3 Heparin Dosing Weight: n/a. TBW = 80 kg  Vital Signs: Temp: 98.3 F (36.8 C) (02/14 1200) Temp Source: Oral (02/14 0843) BP: 166/76 (02/14 1000) Pulse Rate: 73 (02/14 1000)  Labs: Recent Labs    09/22/19 0424 09/22/19 0752 09/23/19 0500 09/23/19 0500 09/24/19 0517 09/24/19 0843  HGB 11.1*   < > 11.1*   < > 11.7* 12.5*  HCT 32.9*   < > 32.4*  --  33.6* 36.4*  PLT 156   < > 157  --  187 200  APTT  --   --   --   --   --  34  LABPROT 14.1  --   --   --   --  13.0  INR 1.1  --   --   --   --  1.0  CREATININE  --    < > 0.77  --  0.90 0.90  CKTOTAL  --   --   --   --   --  39*   < > = values in this interval not displayed.    Estimated Creatinine Clearance: 68.6 mL/min (by C-G formula based on SCr of 0.9 mg/dL).   Medical History: Past Medical History:  Diagnosis Date  . BPH (benign prostatic hyperplasia)   . Cancer ([place])   . Coronary artery disease   . DJD (degenerative joint disease)   . GERD (gastroesophageal reflux disease)   . History of colonic polyps   . Hypercholesteremia   . Hypertension   . Pancreatic adenocarcinoma ([place]) 09/18/2019    Medications:  Apixaban 5 mg PO BID home medication for atrial fibrillation. LD 09/21/19.   Assessment: Pt is a 83yoM admitted on 2/11 with a biliary  obstruction. PMH significant for pancreatic cancer s/p biliary stenting on 09/14/19. Pt discharged from hospital on 2/10, but returned on 2/11 with itching and speech difficulty. MRI on 2/11 showed no acute intracranial abnormality.  Apixaban has been on hold during admission for possible ERCP with stent exchange. Code stroke with teleneurology consult called on 2/14 AM. CT head today shows no evidence of hemorrhage, no acute intracranial abnormality. MRI also shows no acute intracranial abnormality. Pharmacy consulted to initiate heparin drip for atrial fibrillation, will dose using CVA protocol dosing at this time per discussion with MD.  Significant Events: -2/11 MRI: No acute intracranial abnormality. CT: negative for acute intracranial abnormality -2/14 Code stroke. CT negative, MRI negative  Today, 09/24/19  CBC: Hgb 12.5, Plt WNL  INR =1, aPTT 34 seconds. Baseline HL <0.10, no residual effects from [place]. Can monitor heparin infusion using HL  Goal of Therapy:  Heparin level 0.3-0.5 units/ml Monitor platelets by anticoagulation protocol: Yes   Plan:   No bolus  Initiate heparin infusion at 1000 units/hr  Check HL in 8 hours  HL and CBC daily while on  heparin  Monitor for signs/symptoms of bleeding  Follow along for ability to resume PO anticoagulation  Lenis Noon, PharmD 09/24/2019,3:45 PM

## 2019-09-24 NOTE — Consult Note (Signed)
[facility] Phone Note  Discussed case with Dr. Marcelline Deist regarding episode of unresponsiveness. Patient has been having word finding difficulty for the past few days and may have an MRI negative stroke. He has not been baseline since admission and TPA would not be recommended at this time given possibility of a  Recent stroke. Current presentation is non-focal and may also be due to ongoing metabolic disturbance vs a possible seizure.   Unable to see patient as video was not working. Will attempt to get cart to work to re-evaluate patient.   CT brain w/o contrast: no acute process CTA head and neck: no LVO  Ronald Bird M.D.  [facility]

## 2019-09-24 NOTE — Progress Notes (Addendum)
Called to bedside for episode of syncope Per bedside tech, pt was sitting up at edge of bed (sounds like at that time, having word finding difficulties) then slumped over unresponsive. Seen at bedside.   Today's Vitals   09/23/19 2050 09/24/19 0459 09/24/19 0726 09/24/19 0737  BP: (!) 178/80 (!) 191/77 (!) 201/85 (!) 188/98  Pulse: (!) 54 (!) 55 62 73  Resp: 17 18 20  (!) 22  Temp: 98.2 F (36.8 C) 98.2 F (36.8 C)    TempSrc: Oral Oral    SpO2: 95% 96% 94% 96%  Weight:      PainSc:       Body mass index is 24.58 kg/m.    Vitals notable for hypertension Neuro exam limited by pt AMS and inability to follow commands.  Pt was responsive only to painful stimuli with sternal rub, grimacing and withdrawing, but did not say anything.  Pupils difficult to evaluate as resisting opening his eyes.  No rigidity noted with passive range of motion.  No focal deficits noted on exam.   RRR, lungs CTAB, s/nt/nd abdomen. No LEE.  Danzig Macgregor/P For AMS, code stroke with tele neurology consult. No TPA recommended per discussion. ? MRI negative stroke.   Tele neurology has been unable to evaluate pt, have asked RN to continue working on getting cart to work for neurology evaluation No obvious contributing meds noted. Pending imaging, will work on lowering BP as appropriate. Head CT, CTA head/neck. BG 108.  Labs this AM notable for elevated LFT's, relatively stable.  35 min critical care time with AMS

## 2019-09-24 NOTE — Progress Notes (Signed)
Patient was sitting on the side of the bed and had an episode of syncope. The MD was notified and rapid response RN was called.

## 2019-09-24 NOTE — Progress Notes (Addendum)
Called to bedside for unresponsiveness, bradycardia, hypotension. Per nursing, pt was diaphoretic as well at this time. HR's in 40's.  On my arrival, SBP in the 100's with improving HR to 50's and 60's. Placed in trendelenburg and bolus running on my arrival. After he was placed in trendelenburg, he began to come around, but became distressed, complaining of abdominal pain.  He was shouting, praying to God, and unable to get comfortable - notably, during this period of time, he did not seem to have any stuttering or issues with word finding.   Troponin, lactate, lipase added on.  EKG notable for bradycardia with  New q's in I and AVL, right axis deviation -> repeat EKG.  KUB for abdominal discomfort with large amount of stool in L colon, prominent small bowel loops in central abdomen, concerning for ileus vs obstruction.  Exam Initially unresponsive, then became awake, confused, c/o pain, speaking clearly RRR, CTAB Suprapubic discomfort to palpation No LEE Initially not following commands and unresponsive except withdrawal from painful stimuli -> became more awake, able to follow commands - though this continues to fluctuate  Diann Bangerter&P: Acute Metabolic Encephalopathy: waxing/waning, this most recent episode appears to be 2/2 hypotension -> but overall, etiology still unclear -> ddx includes hypertensive encephalopathy, seizures, etc.  Appreciate recommendations from neurology (pending in person evaluation).  EEG is pending.  Relative Hypotension  Bradycardia: ? Related to BP meds vs vagal event with need to have BM?  Improved after bolus/trendelenberg.  Will continue to monitor on telemetry.  Repeat EKG appears similar to prior, sinus, 1st degree AV block, LAD, prolonged QTc.    Abdominal Pain secondary to constipation and acute urinary retention: KUB concerning for ileus vs obstruction, large amount of stool in L colon.  He had an extremely large bowel movement after c/o abdominal pain, also had I/O  cath with >400 cc out.  Will continue to monitor.  Follow lipase.    Planning for transfer to [facility], but given episode of bradycardia/relative hypotension and recurrent AMS, will hold off on transfer for now until more stable.  See previous progress note for additional details -> now planning for transfer.

## 2019-09-24 NOTE — Progress Notes (Signed)
Pt arrived on the unit with EMS transport from [facility].

## 2019-09-24 NOTE — Progress Notes (Addendum)
PROGRESS NOTE    Ronald Bird  [id number] DOB: 07/25/1936 DOA: 09/21/2019 PCP: Venetia Maxon, Sharon Mt, MD   Brief Narrative:  CHISTOPHER Bird is Ronald Bird 84 y.o. male with medical history significant for pancreatic cancer with recent stent placed s/p biliary obstruction on 09/14/2019, hyperlipidemia, afib, HTN, CAD, hypothyroidism, BPH who was discharged from the hospital yesterday AM. He was admitted on 09/18/19 for biliary sepsis/acute cholangitis. Put on oral augmentin. Had elevated LFTs that were trending down on discharge and statin was held.   His daughter states he was not feeling well all day prior to readmission. He felt "off".  He developed Ronald Mcshea rash/itching/pain to his hands/arms/chest after taking augmentin.  He presented to the ED with AMS and word finding difficulties.  Since admission, GI was reconsulted and he was placed on cipro for cholangitis with plan for stent exchange on 2/15.  He had MRI on presentation for his aphasia, which was negative for stroke.  On 2/14 he worsened again, becoming unresponsive at times with waxing and waning encephalopathy.  Neurology was consulted and plan to transfer to [facility] for EEG and additional neurology evaluation.    Assessment & Plan:   Active Problems:   HYPERCHOLESTEROLEMIA   Essential hypertension   Paroxysmal atrial fibrillation (HCC)   Coronary artery disease   Biliary obstruction   Pancreatic adenocarcinoma (HCC)   Dysarthria   Confusion   Hypothyroidism   Drug reaction  2) Acute Metabolic Encephalopathy  Word Finding Difficulties - Initially concerning for delirium with negative imaging for CVA and report of minimal sleep prior to admission - Worsening encephalopathy this morning, currently only responsive to painful stimuli - per RN, did briefly follow some commands after imaging, but inconsistently - Code stroke with tele neuro consult -> appreciate recs - Repeat CT head without acute intracranial abnormality, CTA head/neck with no  LVO - intracranial atherosclerosis including moderate basilar and severe proximal R pCA stenosis, mild cervical carotid artery atherosclerosis without significant stenosis, 3 mm L supraclinoid ICA aneurysm - MRI brain 2/14 without acute abnormality - EEG pending - neurology c/s, appreciate recs -> suspect encephalopathy -> metabolic vs hypertensive vs delirium -> follow repeat blood cx and metabolic workup (follow Z30, folate, TSH)  - continue heparin gtt, avoid cefepime - appreciate recs - [facility] - CT head on admission with no acute findings - MRI brain on admission with no acute findings - trazodone to help with sleep wake cycle - delirium precautions  1) Cholangitis  Biliary obstruction  Pancreatitic Adenocarcinoma -Gi consulted, appreciate recs -Plastic biliary stent placed on 2/4 for malignant bile duct stricture -LFT's stable today -started on cipro per GI -Hold eliquis and plavix in prepartation for repeat ERCP - at this point, plan for ERCP on Monday Feb 15th for removal of current stent and placement of metal biliary stent - will plan to hold off on this pending neurology evaluation, discussed with GI -recommended continuous fluids. -follow up with Dr. Benay Spice on 2/19  3) drug reaction to augmentin -added to allergies  4) HTN -continue home medication of lopressor, ace-I and hctz -BP elevated this morning, will work on lowering this pending repeat imaging   - hydral for SBP >180  6) hyperlipidemia -holding statin due to elevated transaminases   7) paroxysmal afib -rate controlled in sinus rhythm  -hold eliquis incase ERCP -continue beta blocker  -tele x 48 hours  8) CAD -continue home lopressor dosing -hold plavix incase ERCP -nitrostat prn  9) hypothyroidism -checking  TSH (wnl) -continue home synthroid dose  10) GERD -continue protonix   11) BPH -continue home flomax  # Constipation: pt s/p large bowel movement, continue bowel  regimen  # Urinary retention: s/p I/O cath, follow bladder scans  DVT prophylaxis: SCD Code Status: full Family Communication: none at bedside - daughter 2/14 Disposition Plan:  . Patient came from: home           . Anticipated d/c place: home . Barriers to d/c OR conditions which need to be met to effect [name] safe d/c: pending stent exchange by GI   Consultants:   GI  Procedures:  none   Antimicrobials:  Anti-infectives (From admission, onward)   Start     Dose/Rate Route Frequency Ordered Stop   09/21/19 1600  ciprofloxacin (CIPRO) IVPB 400 mg     400 mg 200 mL/hr over 60 Minutes Intravenous Every 12 hours 09/21/19 1547     09/21/19 1415  ceFEPIme (MAXIPIME) 2 g in sodium chloride 0.9 % 100 mL IVPB  Status:  Discontinued     2 g 200 mL/hr over 30 Minutes Intravenous  Once 09/21/19 1400 09/21/19 1547   09/21/19 1415  metroNIDAZOLE (FLAGYL) IVPB 500 mg  Status:  Discontinued     500 mg 100 mL/hr over 60 Minutes Intravenous  Once 09/21/19 1400 09/21/19 1547      Subjective: Unresponsive  Objective: Vitals:   09/24/19 0459 09/24/19 0726 09/24/19 0737 09/24/19 0843  BP: (!) 191/77 (!) 201/85 (!) 188/98   Pulse: (!) 55 62 73   Resp: 18 20 (!) 22   Temp: 98.2 F (36.8 C)   98.4 F (36.9 C)  TempSrc: Oral   Oral  SpO2: 96% 94% 96%   Weight:    79.8 kg  Height:    '5\' 11"'$  (1.803 m)    Intake/Output Summary (Last 24 hours) at 09/24/2019 0859 Last data filed at 09/24/2019 0136 Gross per 24 hour  Intake --  Output 275 ml  Net -275 ml   Filed Weights   09/22/19 2110 09/24/19 0843  Weight: 84.5 kg 79.8 kg    Examination:  General: unresponsive Cardiovascular: Heart sounds show Ronald Bird regular rate, and rhythm Lungs: Clear to auscultation bilaterally Abdomen: Soft, nontender, nondistended  Neurological: responsive only to painful stimuli. Does not follow commands.  Holds arms up when lifted, moves feet to irritative stimuli.  Unable to evaluate pupils as holding eyes  closed and eyes looking up. Skin: Warm and dry. No rashes or lesions. Extremities: No clubbing or cyanosis. No edema.   Data Reviewed: I have personally reviewed following labs and imaging studies  CBC: Recent Labs  Lab 09/20/19 0253 09/21/19 1305 09/22/19 0424 09/23/19 0500 09/24/19 0517  WBC 5.6 6.2 5.2 5.7 6.3  NEUTROABS 4.0 4.5 3.4 3.7 3.9  HGB 11.0* 12.6* 11.1* 11.1* 11.7*  HCT 32.4* 36.6* 32.9* 32.4* 33.6*  MCV 96.4 94.1 95.9 95.0 94.6  PLT 118* 152 156 157 448   Basic Metabolic Panel: Recent Labs  Lab 09/18/19 1201 09/18/19 1641 09/19/19 0315 09/19/19 0315 09/20/19 0253 09/21/19 1305 09/21/19 1330 09/22/19 0752 09/23/19 0500 09/24/19 0517  NA   < >  --  132*   < > 134* 134*  --  134* 134* 135  K   < >  --  4.1   < > 3.9 3.6  --  3.7 3.5 3.1*  CL   < >  --  104   < > 107 102  --  104  104 104  CO2   < >  --  22   < > 22 24  --  '22 22 23  '$ GLUCOSE   < >  --  106*   < > 117* 116*  --  109* 118* 129*  BUN   < >  --  16   < > 13 17  --  '15 19 18  '$ CREATININE   < >  --  0.81   < > 1.01 0.81  --  0.94 0.77 0.90  CALCIUM   < >  --  8.3*   < > 8.2* 8.9  --  8.6* 8.5* 8.7*  MG  --  1.6* 2.0  --   --   --  2.1  --  1.8 1.8  PHOS  --  3.8  --   --   --   --   --   --  3.5 3.2   < > = values in this interval not displayed.   GFR: Estimated Creatinine Clearance: 68.6 mL/min (by C-G formula based on SCr of 0.9 mg/dL). Liver Function Tests: Recent Labs  Lab 09/20/19 0253 09/21/19 1305 09/22/19 0752 09/23/19 0500 09/24/19 0517  AST 134* 143* 138* 106* 113*  ALT 183* 176* 159* 134* 142*  ALKPHOS 326* 403* 364* 352* 334*  BILITOT 1.0 2.0* 1.4* 1.1 0.9  PROT 5.7* 6.8 5.6* 5.9* 6.3*  ALBUMIN 3.0* 3.5 3.0* 3.2* 3.3*   Recent Labs  Lab 09/18/19 1201 09/21/19 1305  LIPASE 13 12   No results for input(s): AMMONIA in the last 168 hours. Coagulation Profile: Recent Labs  Lab 09/18/19 1201 09/19/19 0315 09/21/19 1330 09/22/19 0424  INR 1.4* 1.3* 1.1 1.1    Cardiac Enzymes: No results for input(s): CKTOTAL, CKMB, CKMBINDEX, TROPONINI in the last 168 hours. BNP (last 3 results) No results for input(s): PROBNP in the last 8760 hours. HbA1C: No results for input(s): HGBA1C in the last 72 hours. CBG: Recent Labs  Lab 09/18/19 1134 09/24/19 0729  GLUCAP 107* 108*   Lipid Profile: No results for input(s): CHOL, HDL, LDLCALC, TRIG, CHOLHDL, LDLDIRECT in the last 72 hours. Thyroid Function Tests: Recent Labs    09/22/19 0424  TSH 2.591   Anemia Panel: No results for input(s): VITAMINB12, FOLATE, FERRITIN, TIBC, IRON, RETICCTPCT in the last 72 hours. Sepsis Labs: Recent Labs  Lab 09/18/19 1201 09/21/19 1305  LATICACIDVEN 1.2 0.9    Recent Results (from the past 240 hour(s))  Urine culture     Status: Abnormal   Collection Time: 09/18/19 11:50 AM   Specimen: Urine, Clean Catch  Result Value Ref Range Status   Specimen Description   Final    URINE, CLEAN CATCH Performed at [facility], [place] [address], [place], [place] [address]    Special Requests   Final    NONE Performed at [facility], [place] [address], [place], [place] [address]    Culture MULTIPLE SPECIES PRESENT, SUGGEST RECOLLECTION (Joslyn Ramos)  Final   Report Status 09/19/2019 FINAL  Final  Blood culture (routine x 2)     Status: None   Collection Time: 09/18/19 12:01 PM   Specimen: BLOOD  Result Value Ref Range Status   Specimen Description   Final    BLOOD LEFT ANTECUBITAL Performed at [place] [address] [place], [place] [address]    Special Requests   Final    BOTTLES DRAWN AEROBIC AND ANAEROBIC Blood Culture adequate volume Performed at  [facility], [place] [address], [place], [place] [address]    Culture   Final    NO GROWTH 5 DAYS Performed at [facility], [place] [address], [place], [place] [address]    Report Status 09/23/2019 FINAL  Final  Respiratory Panel by RT PCR  (Flu Leira Regino&B, Covid) - Nasopharyngeal Swab     Status: None   Collection Time: 09/18/19 12:54 PM   Specimen: Nasopharyngeal Swab  Result Value Ref Range Status   SARS Coronavirus 2 by RT PCR NEGATIVE NEGATIVE Final    Comment: (NOTE) SARS-CoV-2 target nucleic acids are NOT DETECTED. The SARS-CoV-2 RNA is generally detectable in upper respiratoy specimens during the acute phase of infection. The lowest concentration of SARS-CoV-2 viral copies this assay can detect is 131 copies/mL. Kim Lauver negative result does not preclude SARS-Cov-2 infection and should not be used as the sole basis for treatment or other patient management decisions. Rithik Odea negative result may occur with  improper specimen collection/handling, submission of specimen other than nasopharyngeal swab, presence of viral mutation(s) within the areas targeted by this assay, and inadequate number of viral copies (<131 copies/mL). Tiffani Kadow negative result must be combined with clinical observations, patient history, and epidemiological information. The expected result is Negative. Fact Sheet for Patients:  PinkCheek.be Fact Sheet for Healthcare Providers:  GravelBags.it This test is not yet ap proved or cleared by the [place] FDA and  has been authorized for detection and/or diagnosis of SARS-CoV-2 by FDA under an Emergency Use Authorization (EUA). This EUA will remain  in effect (meaning this test can be used) for the duration of the COVID-19 declaration under Section 564(b)(1) of the Act, 21 U.S.C. section 360bbb-3(b)(1), unless the authorization is terminated or revoked sooner.    Influenza Gabbriella Presswood by PCR NEGATIVE NEGATIVE Final   Influenza B by PCR NEGATIVE NEGATIVE Final    Comment: (NOTE) The Xpert Xpress SARS-CoV-2/FLU/RSV assay is intended as an aid in  the diagnosis of influenza from Nasopharyngeal swab specimens and  should not be used as Crystle Carelli sole basis for treatment. Nasal  washings and  aspirates are unacceptable for Xpert Xpress SARS-CoV-2/FLU/RSV  testing. Fact Sheet for Patients: PinkCheek.be Fact Sheet for Healthcare Providers: GravelBags.it This test is not yet approved or cleared by the [place] FDA and  has been authorized for detection and/or diagnosis of SARS-CoV-2 by  FDA under an Emergency Use Authorization (EUA). This EUA will remain  in effect (meaning this test can be used) for the duration of the  Covid-19 declaration under Section 564(b)(1) of the Act, 21  U.S.C. section 360bbb-3(b)(1), unless the authorization is  terminated or revoked. Performed at [facility], [place] [address], [place], [place] [address]   Blood culture (routine x 2)     Status: None   Collection Time: 09/18/19  4:41 PM   Specimen: BLOOD  Result Value Ref Range Status   Specimen Description   Final    BLOOD RIGHT ANTECUBITAL Performed at [facility] [address] [place], [place] [address]    Special Requests   Final    BOTTLES DRAWN AEROBIC AND ANAEROBIC Blood Culture adequate volume Performed at [place] [address] [place], [place] [address]    Culture   Final    NO GROWTH 5 DAYS Performed at [facility], [place] [address], [place], [place] [address]    Report Status 09/23/2019 FINAL  Final  Urine culture  Status: None   Collection Time: 09/21/19  1:05 PM   Specimen: Urine, Clean Catch  Result Value Ref Range Status   Specimen Description   Final    URINE, CLEAN CATCH Performed at [facility], [place] [address], [place], [place] [address]    Special Requests   Final    NONE Performed at [facility], [place] [address], [place], [place] [address]    Culture   Final    NO GROWTH Performed at [facility], [place] [address] [place], [place] [address]    Report Status 09/23/2019  FINAL  Final         Radiology Studies: CT Code Stroke CTA Head W/WO contrast  Result Date: 09/24/2019 CLINICAL DATA:  Encephalopathy.  Found unresponsive. EXAM: CT ANGIOGRAPHY HEAD AND NECK TECHNIQUE: Multidetector CT imaging of the head and neck was performed using the standard protocol during bolus administration of intravenous contrast. Multiplanar CT image reconstructions and MIPs were obtained to evaluate the vascular anatomy. Carotid stenosis measurements (when applicable) are obtained utilizing NASCET criteria, using the distal internal carotid diameter as the denominator. CONTRAST:  14m OMNIPAQUE IOHEXOL 350 MG/ML SOLN COMPARISON:  None. FINDINGS: CTA NECK FINDINGS Aortic arch: Standard 3 vessel aortic arch with mild atherosclerotic plaque. No arch vessel origin stenosis. Right carotid system: Patent with mild calcified plaque at the carotid bifurcation. No evidence of significant stenosis or dissection. Left carotid system: Patent with mild calcified plaque at the carotid bifurcation. No evidence of significant stenosis or dissection. Vertebral arteries: Patent without evidence of significant stenosis or dissection. Strongly dominant left vertebral artery. Skeleton: No acute osseous abnormality or suspicious osseous lesion. Advanced disc degeneration from C4-5 to C6-7 and advanced right-sided facet arthrosis at C3-4. Potentially severe spinal stenosis at C4-5 and C5-6 and moderate spinal stenosis at C6-7. Moderate multilevel neural foraminal stenosis. Other neck: No evidence of cervical lymphadenopathy or mass. Upper chest: Clear lung apices. Review of the MIP images confirms the above findings CTA HEAD FINDINGS Anterior circulation: The internal carotid arteries are patent from skull base to carotid termini with calcified plaque resulting in mild right greater than left cavernous and proximal supraclinoid stenosis. There is Quentin Shorey 3 mm left supraclinoid ICA aneurysm in the posterior communicating  region. ACAs and MCAs are patent without evidence of proximal branch occlusion or significant proximal stenosis. Posterior circulation: The intracranial vertebral arteries are patent to the basilar with the left being strongly dominant. Left V4 segment atherosclerotic plaque results in at most mild stenosis. Patent PICA, AICA, and SCA origins are seen bilaterally. The basilar artery is patent with Xariah Silvernail moderate stenosis proximally. There is Judy Goodenow fetal origin of the right PCA. Both PCAs are patent with branch vessel irregular narrowing as well as Caiya Bettes severe stenosis of the distal posterior communicating artery/proximal P2 segment on the right. No aneurysm is identified. Venous sinuses: Patent. Anatomic variants: Fetal right PCA. Review of the MIP images confirms the above findings IMPRESSION: 1. No large vessel occlusion. 2. Intracranial atherosclerosis including moderate basilar and severe proximal right PCA stenoses. 3. Mild cervical carotid artery atherosclerosis without significant stenosis. 4. 3 mm left supraclinoid ICA aneurysm. Preliminary findings of no large vessel occlusion were communicated to Dr. CFayrene Helperat 8:16 am on 09/24/2019 by text page via the [facility]messaging system. Electronically Signed   By: ALogan BoresM.D.   On: 09/24/2019 08:33   CT Code Stroke CTA Neck W/WO contrast  Result Date: 09/24/2019 CLINICAL DATA:  Encephalopathy.  Found unresponsive. EXAM: CT ANGIOGRAPHY HEAD AND NECK TECHNIQUE: Multidetector CT imaging of the head and neck was performed using the standard protocol during bolus administration of intravenous contrast. Multiplanar CT image reconstructions and MIPs were obtained to evaluate the vascular anatomy. Carotid stenosis measurements (when applicable) are obtained utilizing NASCET criteria, using the distal internal carotid diameter as the denominator. CONTRAST:  24m OMNIPAQUE IOHEXOL 350 MG/ML SOLN COMPARISON:  None. FINDINGS: CTA NECK FINDINGS Aortic arch: Standard 3  vessel aortic arch with mild atherosclerotic plaque. No arch vessel origin stenosis. Right carotid system: Patent with mild calcified plaque at the carotid bifurcation. No evidence of significant stenosis or dissection. Left carotid system: Patent with mild calcified plaque at the carotid bifurcation. No evidence of significant stenosis or dissection. Vertebral arteries: Patent without evidence of significant stenosis or dissection. Strongly dominant left vertebral artery. Skeleton: No acute osseous abnormality or suspicious osseous lesion. Advanced disc degeneration from C4-5 to C6-7 and advanced right-sided facet arthrosis at C3-4. Potentially severe spinal stenosis at C4-5 and C5-6 and moderate spinal stenosis at C6-7. Moderate multilevel neural foraminal stenosis. Other neck: No evidence of cervical lymphadenopathy or mass. Upper chest: Clear lung apices. Review of the MIP images confirms the above findings CTA HEAD FINDINGS Anterior circulation: The internal carotid arteries are patent from skull base to carotid termini with calcified plaque resulting in mild right greater than left cavernous and proximal supraclinoid stenosis. There is Ziare Orrick 3 mm left supraclinoid ICA aneurysm in the posterior communicating region. ACAs and MCAs are patent without evidence of proximal branch occlusion or significant proximal stenosis. Posterior circulation: The intracranial vertebral arteries are patent to the basilar with the left being strongly dominant. Left V4 segment atherosclerotic plaque results in at most mild stenosis. Patent PICA, AICA, and SCA origins are seen bilaterally. The basilar artery is patent with Chauna Osoria moderate stenosis proximally. There is Finis Hendricksen fetal origin of the right PCA. Both PCAs are patent with branch vessel irregular narrowing as well as Antoino Westhoff severe stenosis of the distal posterior communicating artery/proximal P2 segment on the right. No aneurysm is identified. Venous sinuses: Patent. Anatomic variants: Fetal  right PCA. Review of the MIP images confirms the above findings IMPRESSION: 1. No large vessel occlusion. 2. Intracranial atherosclerosis including moderate basilar and severe proximal right PCA stenoses. 3. Mild cervical carotid artery atherosclerosis without significant stenosis. 4. 3 mm left supraclinoid ICA aneurysm. Preliminary findings of no large vessel occlusion were communicated to Dr. CFayrene Helperat 8:16 am on 09/24/2019 by text page via the [facility]messaging system. Electronically Signed   By: ALogan BoresM.D.   On: 09/24/2019 08:33   CT HEAD CODE STROKE WO CONTRAST  Result Date: 09/24/2019 CLINICAL DATA:  Code stroke.  Encephalopathy.  Found unresponsive. EXAM: CT HEAD WITHOUT CONTRAST TECHNIQUE: Contiguous axial images were obtained from the base of the skull through the vertex without intravenous contrast. COMPARISON:  Head CT and MRI 09/20/2009 FINDINGS: Brain: There is no evidence of acute infarct, intracranial hemorrhage, mass, midline shift, or extra-axial fluid collection. Confluent hypodensities in the cerebral white matter bilaterally are unchanged and nonspecific but compatible with severe chronic small vessel ischemic disease. Cerebral atrophy is mild-to-moderate for age. Vascular: Calcified atherosclerosis at the skull base. No hyperdense vessel. Skull: No fracture or suspicious osseous lesion. Sinuses/Orbits: Paranasal sinuses and mastoid air cells are clear. Bilateral cataract extraction. Other: None. ASPECTS ([facility] Early CT Score) Not scored with this history. IMPRESSION: 1. No evidence of acute intracranial abnormality. 2. Severe  chronic small vessel ischemic disease. These results were communicated to Dr. Fayrene Helper at 8:16 am on 09/24/2019 by text page via the [facility] messaging system. Electronically Signed   By: Logan Bores M.D.   On: 09/24/2019 08:17        Scheduled Meds: . bisacodyl  5 mg Oral Daily  . Chlorhexidine Gluconate Cloth  6 each Topical  Daily  . hydrochlorothiazide  12.5 mg Oral Daily  . levothyroxine  25 mcg Oral QAC breakfast  . lisinopril  20 mg Oral BID  . magnesium oxide  400 mg Oral Daily  . metoprolol tartrate  25 mg Oral BID  . pantoprazole  40 mg Oral QAC breakfast  . sodium chloride (PF)      . sodium chloride flush  3 mL Intravenous Q12H  . tamsulosin  0.4 mg Oral QHS  . traZODone  50 mg Oral QHS   Continuous Infusions: . sodium chloride    . sodium chloride 100 mL/hr at 09/24/19 0830  . ciprofloxacin 400 mg (09/24/19 0432)     LOS: 3 days    Time spent: over 30 min    Fayrene Helper, MD [facility]   To contact the attending provider between 7A-7P or the covering provider during after hours 7P-7A, please log into the web site [email or web address] and access using universal [place] password for that web site. If you do not have the password, please call the hospital operator.  09/24/2019, 8:59 AM

## 2019-09-24 NOTE — Consult Note (Addendum)
Requesting Physician: Dr. Florene Glen    Chief Complaint: Altered mental status  History obtained from: Patient and Chart    HPI:                                                                                                                                       Ronald Bird is a 84 y.o. male with past medical history significant for pancreatic cancer, stent placed status post biliary obstruction, atrial fibrillation on Eliquis, hypertension, coronary artery disease, hypothyroidism admitted on  09/21/19 for altered mental status/fatigue after being discharged on 09/18/2019 for biliary sepsis/acute cholangitis.  Patient speech is on and off and can find his words since 8:30 AM on 2/11. On arrival blood pressure was 171 x 88. Ammonia was normal, infectious work-up was normal. MRI brain was performed which showed no acute findings. GI plan to do ERCP, patient taken off Eliquis and Plavix.   Today morning, patient found unresponsive in his bed. Per hospitalist, he will forcibly closes eyes. As code stroke was called -stat CT angiogram was obtained which was negative for large vessel occlusion, did show a mid basilar stenosis which was moderate and severe right PCA stenosis. An MRI brain was repeated which is negative for acute stroke. Shortly after, patient's mentation improved and he was talking and having conversation with the nurse. Patient was planned to be transferred to [facility] for EEG after discussion with neurology.  Patient had hypotensive episode with bradycardia around 1 PM. Patient placed in Trendelenburg position, mentation improved and patient started to shout and pray to God. However this continued to fluctuate, neurology requested to come to [facility] long to evaluate the patient in initial concern of safety during transport.   Past Medical History:  Diagnosis Date  . BPH (benign prostatic hyperplasia)   . Cancer ([place])   . Coronary artery disease   . DJD (degenerative joint disease)   .  GERD (gastroesophageal reflux disease)   . History of colonic polyps   . Hypercholesteremia   . Hypertension   . Pancreatic adenocarcinoma ([place]) 09/18/2019    Past Surgical History:  Procedure Laterality Date  . BILIARY STENT PLACEMENT N/A 09/14/2019   Procedure: BILIARY STENT PLACEMENT;  Surgeon: Milus Banister, MD;  Location: [facility];  Service: Endoscopy;  Laterality: N/A;  . CATARACT EXTRACTION, BILATERAL  2012   In [facility]  . CORONARY ATHERECTOMY N/A 09/12/2018   Procedure: CORONARY ATHERECTOMY;  Surgeon: Jettie Booze, MD;  Location: [place] [facility] LAB;  Service: Cardiovascular;  Laterality: N/A;  mid LAD  . CORONARY BALLOON ANGIOPLASTY N/A 09/12/2018   Procedure: CORONARY BALLOON ANGIOPLASTY;  Surgeon: Jettie Booze, MD;  Location: [place] CV LAB;  Service: Cardiovascular;  Laterality: N/A;  diag 1  . CORONARY STENT INTERVENTION N/A 09/12/2018   Procedure: CORONARY STENT INTERVENTION;  Surgeon: Jettie Booze, MD;  Location: [place] CV LAB;  Service: Cardiovascular;  Laterality: N/A;  mid LAD  . ENDOSCOPIC RETROGRADE CHOLANGIOPANCREATOGRAPHY (ERCP) WITH PROPOFOL N/A 09/14/2019   Procedure: ENDOSCOPIC RETROGRADE CHOLANGIOPANCREATOGRAPHY (ERCP) WITH PROPOFOL;  Surgeon: Milus Banister, MD;  [facility];  Service: Endoscopy;  Laterality: N/A;  . ESOPHAGOGASTRODUODENOSCOPY (EGD) WITH PROPOFOL N/A 09/14/2019   Procedure: ESOPHAGOGASTRODUODENOSCOPY (EGD) WITH PROPOFOL;  Surgeon: Milus Banister, MD;  [facility];  Service: Endoscopy;  Laterality: N/A;  . EUS N/A 09/14/2019   Procedure: UPPER ENDOSCOPIC ULTRASOUND (EUS) RADIAL;  Surgeon: Milus Banister, MD;  Location[facility];  Service: Endoscopy;  Laterality: N/A;  CAT 2  . FINE NEEDLE ASPIRATION N/A 09/14/2019   Procedure: FINE NEEDLE ASPIRATION (FNA) LINEAR;  Surgeon: Milus Banister, MD;  [facility];  Service: Endoscopy;  Laterality: N/A;  . LEFT HEART CATH AND CORONARY  ANGIOGRAPHY N/A 09/09/2018   Procedure: LEFT HEART CATH AND CORONARY ANGIOGRAPHY;  Surgeon: Burnell Blanks, MD;  Location: [place] CV LAB;  Service: Cardiovascular;  Laterality: N/A;  . LEFT HEART CATH AND CORONARY ANGIOGRAPHY N/A 09/12/2018   Procedure: LEFT HEART CATH AND CORONARY ANGIOGRAPHY;  Surgeon: Jettie Booze, MD;  Location: [place] CV LAB;  Service: Cardiovascular;  Laterality: N/A;  . MOHS SURGERY  2012   Skin Cancer removed from nose by Moh's in   . SHOULDER SURGERY      Family History  Problem Relation Age of Onset  . Heart disease Father   . Stroke Father   . Hypertension Father   . Heart disease Mother   . Stroke Mother   . Hypertension Mother   . Atrial fibrillation Brother   . CAD Sister        Patient does not know details   Social History:  reports that he has never smoked. He has never used smokeless tobacco. He reports that he does not drink alcohol or use drugs.  Allergies:  Allergies  Allergen Reactions  . Augmentin [Amoxicillin-Pot Clavulanate] Rash    Did it involve swelling of the face/tongue/throat, SOB, or low BP? No Did it involve sudden or severe rash/hives, skin peeling, or any reaction on the inside of your mouth or nose? No Did you need to seek medical attention at a hospital or doctor's office? Yes When did it last happen?Feb 2021 If all above answers are "NO", may proceed with cephalosporin use.    Medications:                                                                                                                        I reviewed home medications  ROS:  Unable to review systems   Examination:                                                                                                      General: Appears well-developed and well-nourished.  Psych: Not  cooperative Eyes: No scleral injection HENT: No OP obstrucion Head: Normocephalic.  Cardiovascular: Normal rate and regular rhythm.  Respiratory: Effort normal and breath sounds normal to anterior ascultation GI: Soft.  No distension. There is no tenderness.  Skin: WDI    Neurological Examination Mental Status: Somnolent, forcibly closes his eyes. Does not follow any commands. Cranial Nerves: Pupils equal and reactive, no forced gaze deviation. Face appears symmetric. Tongue is midline. Motor: UE: No drift in either extremity LE: Withdraws briskly to noxious stimulus bilaterally Tone and bulk:normal tone throughout; no atrophy noted Sensory: Withdraws to noxious stimulus Deep Tendon Reflexes: 2+ and symmetric throughout Plantars: Right: downgoing   Left: downgoing Cerebellar: Unable to assess, no gross ataxia seen Gait: normal gait and station     Lab Results: Basic Metabolic Panel: Recent Labs  Lab 09/18/19 1201 09/18/19 1641 09/19/19 0315 09/20/19 0253 09/21/19 1305 09/21/19 1305 09/21/19 1330 09/22/19 0752 09/22/19 0752 09/23/19 0500 09/24/19 0517 09/24/19 0843  NA   < >  --  132*   < > 134*  --   --  134*  --  134* 135 133*  K   < >  --  4.1   < > 3.6  --   --  3.7  --  3.5 3.1* 3.1*  CL   < >  --  104   < > 102  --   --  104  --  104 104 102  CO2   < >  --  22   < > 24  --   --  22  --  22 23 21*  GLUCOSE   < >  --  106*   < > 116*  --   --  109*  --  118* 129* 120*  BUN   < >  --  16   < > 17  --   --  15  --  19 18 17   CREATININE   < >  --  0.81   < > 0.81  --   --  0.94  --  0.77 0.90 0.90  CALCIUM   < >  --  8.3*   < > 8.9   < >  --  8.6*   < > 8.5* 8.7* 8.4*  MG  --  1.6* 2.0  --   --   --  2.1  --   --  1.8 1.8  --   PHOS  --  3.8  --   --   --   --   --   --   --  3.5 3.2  --    < > = values in this interval not displayed.    CBC: Recent Labs  Lab 09/20/19 0253 09/20/19 0253 09/21/19 1305 09/22/19 0424 09/23/19 0500 09/24/19 [id number]  09/24/19 0843  WBC 5.6   < > 6.2 5.2 5.7 6.3 7.1  NEUTROABS 4.0  --  4.5 3.4 3.7 3.9  --   HGB 11.0*   < > 12.6* 11.1* 11.1* 11.7* 12.5*  HCT 32.4*   < > 36.6* 32.9* 32.4* 33.6* 36.4*  MCV 96.4   < > 94.1 95.9 95.0 94.6 94.5  PLT 118*   < > 152 156 157 187 200   < > = values in this interval not displayed.    Coagulation Studies: Recent Labs    09/22/19 0424 09/24/19 0843  LABPROT 14.1 13.0  INR 1.1 1.0    Imaging: CT Code Stroke CTA Head W/WO contrast  Result Date: 09/24/2019 CLINICAL DATA:  Encephalopathy.  Found unresponsive. EXAM: CT ANGIOGRAPHY HEAD AND NECK TECHNIQUE: Multidetector CT imaging of the head and neck was performed using the standard protocol during bolus administration of intravenous contrast. Multiplanar CT image reconstructions and MIPs were obtained to evaluate the vascular anatomy. Carotid stenosis measurements (when applicable) are obtained utilizing NASCET criteria, using the distal internal carotid diameter as the denominator. CONTRAST:  63mL OMNIPAQUE IOHEXOL 350 MG/ML SOLN COMPARISON:  None. FINDINGS: CTA NECK FINDINGS Aortic arch: Standard 3 vessel aortic arch with mild atherosclerotic plaque. No arch vessel origin stenosis. Right carotid system: Patent with mild calcified plaque at the carotid bifurcation. No evidence of significant stenosis or dissection. Left carotid system: Patent with mild calcified plaque at the carotid bifurcation. No evidence of significant stenosis or dissection. Vertebral arteries: Patent without evidence of significant stenosis or dissection. Strongly dominant left vertebral artery. Skeleton: No acute osseous abnormality or suspicious osseous lesion. Advanced disc degeneration from C4-5 to C6-7 and advanced right-sided facet arthrosis at C3-4. Potentially severe spinal stenosis at C4-5 and C5-6 and moderate spinal stenosis at C6-7. Moderate multilevel neural foraminal stenosis. Other neck: No evidence of cervical lymphadenopathy or mass.  Upper chest: Clear lung apices. Review of the MIP images confirms the above findings CTA HEAD FINDINGS Anterior circulation: The internal carotid arteries are patent from skull base to carotid termini with calcified plaque resulting in mild right greater than left cavernous and proximal supraclinoid stenosis. There is a 3 mm left supraclinoid ICA aneurysm in the posterior communicating region. ACAs and MCAs are patent without evidence of proximal branch occlusion or significant proximal stenosis. Posterior circulation: The intracranial vertebral arteries are patent to the basilar with the left being strongly dominant. Left V4 segment atherosclerotic plaque results in at most mild stenosis. Patent PICA, AICA, and SCA origins are seen bilaterally. The basilar artery is patent with a moderate stenosis proximally. There is a fetal origin of the right PCA. Both PCAs are patent with branch vessel irregular narrowing as well as a severe stenosis of the distal posterior communicating artery/proximal P2 segment on the right. No aneurysm is identified. Venous sinuses: Patent. Anatomic variants: Fetal right PCA. Review of the MIP images confirms the above findings IMPRESSION: 1. No large vessel occlusion. 2. Intracranial atherosclerosis including moderate basilar and severe proximal right PCA stenoses. 3. Mild cervical carotid artery atherosclerosis without significant stenosis. 4. 3 mm left supraclinoid ICA aneurysm. Preliminary findings of no large vessel occlusion were communicated to Dr. Fayrene Helper at 8:16 am on 09/24/2019 by text page via the [facility] messaging system. Electronically Signed   By: Logan Bores M.D.   On: 09/24/2019 08:33   DG Abd 1 View  Result Date: 09/24/2019 CLINICAL DATA:  Abdominal discomfort. EXAM: ABDOMEN - 1 VIEW COMPARISON:  None.  FINDINGS: Large amount of stool in the LEFT colon, measuring approximately 9 cm diameter. Mildly prominent small-bowel loops in the central abdomen. No evidence of  free intraperitoneal air or abnormal fluid collection is seen. No acute appearing osseous abnormality within the visualized portion of the abdomen or pelvis. IMPRESSION: 1. Large amount of stool in the LEFT colon, measuring approximately 9 cm diameter. 2. Mildly prominent small-bowel loops in the central abdomen, raising the possibility of ileus or obstruction. Electronically Signed   By: Franki Cabot M.D.   On: 09/24/2019 15:03   CT Code Stroke CTA Neck W/WO contrast  Result Date: 09/24/2019 CLINICAL DATA:  Encephalopathy.  Found unresponsive. EXAM: CT ANGIOGRAPHY HEAD AND NECK TECHNIQUE: Multidetector CT imaging of the head and neck was performed using the standard protocol during bolus administration of intravenous contrast. Multiplanar CT image reconstructions and MIPs were obtained to evaluate the vascular anatomy. Carotid stenosis measurements (when applicable) are obtained utilizing NASCET criteria, using the distal internal carotid diameter as the denominator. CONTRAST:  18mL OMNIPAQUE IOHEXOL 350 MG/ML SOLN COMPARISON:  None. FINDINGS: CTA NECK FINDINGS Aortic arch: Standard 3 vessel aortic arch with mild atherosclerotic plaque. No arch vessel origin stenosis. Right carotid system: Patent with mild calcified plaque at the carotid bifurcation. No evidence of significant stenosis or dissection. Left carotid system: Patent with mild calcified plaque at the carotid bifurcation. No evidence of significant stenosis or dissection. Vertebral arteries: Patent without evidence of significant stenosis or dissection. Strongly dominant left vertebral artery. Skeleton: No acute osseous abnormality or suspicious osseous lesion. Advanced disc degeneration from C4-5 to C6-7 and advanced right-sided facet arthrosis at C3-4. Potentially severe spinal stenosis at C4-5 and C5-6 and moderate spinal stenosis at C6-7. Moderate multilevel neural foraminal stenosis. Other neck: No evidence of cervical lymphadenopathy or mass.  Upper chest: Clear lung apices. Review of the MIP images confirms the above findings CTA HEAD FINDINGS Anterior circulation: The internal carotid arteries are patent from skull base to carotid termini with calcified plaque resulting in mild right greater than left cavernous and proximal supraclinoid stenosis. There is a 3 mm left supraclinoid ICA aneurysm in the posterior communicating region. ACAs and MCAs are patent without evidence of proximal branch occlusion or significant proximal stenosis. Posterior circulation: The intracranial vertebral arteries are patent to the basilar with the left being strongly dominant. Left V4 segment atherosclerotic plaque results in at most mild stenosis. Patent PICA, AICA, and SCA origins are seen bilaterally. The basilar artery is patent with a moderate stenosis proximally. There is a fetal origin of the right PCA. Both PCAs are patent with branch vessel irregular narrowing as well as a severe stenosis of the distal posterior communicating artery/proximal P2 segment on the right. No aneurysm is identified. Venous sinuses: Patent. Anatomic variants: Fetal right PCA. Review of the MIP images confirms the above findings IMPRESSION: 1. No large vessel occlusion. 2. Intracranial atherosclerosis including moderate basilar and severe proximal right PCA stenoses. 3. Mild cervical carotid artery atherosclerosis without significant stenosis. 4. 3 mm left supraclinoid ICA aneurysm. Preliminary findings of no large vessel occlusion were communicated to Dr. Fayrene Helper at 8:16 am on 09/24/2019 by text page via the [facility] messaging system. Electronically Signed   By: Logan Bores M.D.   On: 09/24/2019 08:33   MR BRAIN WO CONTRAST  Result Date: 09/24/2019 CLINICAL DATA:  Encephalopathy. EXAM: MRI HEAD WITHOUT CONTRAST TECHNIQUE: Multiplanar, multiecho pulse sequences of the brain and surrounding structures were obtained without intravenous contrast. COMPARISON:  Head CT  09/24/2019 and MRI  09/21/2019 FINDINGS: Brain: There is no evidence of acute infarct, intracranial hemorrhage, mass, midline shift, or extra-axial fluid collection. Cerebral atrophy is mild-to-moderate for age. Patchy and confluent T2 hyperintensities in the cerebral white matter bilaterally are unchanged from the recent prior MRI and are nonspecific but compatible with severe chronic small vessel ischemic disease. Vascular: Major intracranial vascular flow voids are preserved. Skull and upper cervical spine: No suspicious marrow lesion. C3-4 disc herniation with spinal stenosis, incompletely evaluated but similar to the recent prior MRI. Sinuses/Orbits: Bilateral cataract extraction. Paranasal sinuses and mastoid air cells are clear. Other: None. IMPRESSION: 1. No acute intracranial abnormality. 2. Severe chronic small vessel ischemic disease. Electronically Signed   By: Logan Bores M.D.   On: 09/24/2019 10:59   CT HEAD CODE STROKE WO CONTRAST  Result Date: 09/24/2019 CLINICAL DATA:  Code stroke.  Encephalopathy.  Found unresponsive. EXAM: CT HEAD WITHOUT CONTRAST TECHNIQUE: Contiguous axial images were obtained from the base of the skull through the vertex without intravenous contrast. COMPARISON:  Head CT and MRI 09/20/2009 FINDINGS: Brain: There is no evidence of acute infarct, intracranial hemorrhage, mass, midline shift, or extra-axial fluid collection. Confluent hypodensities in the cerebral white matter bilaterally are unchanged and nonspecific but compatible with severe chronic small vessel ischemic disease. Cerebral atrophy is mild-to-moderate for age. Vascular: Calcified atherosclerosis at the skull base. No hyperdense vessel. Skull: No fracture or suspicious osseous lesion. Sinuses/Orbits: Paranasal sinuses and mastoid air cells are clear. Bilateral cataract extraction. Other: None. ASPECTS [facility] Stroke Program Early CT Score) Not scored with this history. IMPRESSION: 1. No evidence of acute intracranial  abnormality. 2. Severe chronic small vessel ischemic disease. These results were communicated to Dr. Fayrene Helper at 8:16 am on 09/24/2019 by text page via the [facility] messaging system. Electronically Signed   By: Logan Bores M.D.   On: 09/24/2019 08:17     I have reviewed the above imaging : MRI brain negative for acute process.   ASSESSMENT AND PLAN  84 y.o. male with past medical history significant for pancreatic cancer, stent placed status post biliary obstruction, atrial fibrillation on Eliquis, hypertension, coronary artery disease, hypothyroidism admitted on  09/21/19 for altered mental status/fatigue after being discharged on 09/18/2019 for biliary sepsis/acute cholangitis.  Patient had speech abnormality on arrival, MRI brain was negative. Had sudden worsening of mental status and not responding-code stroke called : MRI negative for acute stroke, CTA shows mid basilar stenosis that is moderate.  Severe right PCA stenosis. Has been maximally mental status since.  On my assessment, patient lying in bed. When attempted to arouse-forcibly closes his eyes. Tries to forcibly close mouth. Upon raising both arms, continues to keep it upwards for more than 30 seconds without any drift. Withdraws briskly in bilateral lower extremities. No facial droop seen.   Suspect presentation likely delirium. Other considerations does include seizure with postictal state versus recurrent TIA. Discussed with Dr. Arthur Holms presentation and the fact that he has A. fib and was taken off Eliquis for ERCP, recommended heparin drip. Also if patient has strokelike symptoms, can obtain CT perfusion to see if right PCA stenosis and symptomatic. Recommended transfer the patient to [facility] if stable, as we cannot obtain continuous EEG if needed head was normal.   Metabolic encephalopathy Hypertensive encephalopathy Delirium  Recommendations Repeat metabolic work-up including blood cultures Routine EEG for now-low  suspicion of nonconvulsive status epilepticus  Continue heparin drip, Eliquis and Plavix held for ERCP Currently on Cipro for  antibiotics, avoid cefepime  Neurology will continue to follow.   Barbarita Hutmacher [facility] Pager Number [phone]

## 2019-09-25 ENCOUNTER — Encounter (HOSPITAL_COMMUNITY)
Admission: EM | Disposition: A | Discharge status: Home Health | Payer: Self-pay | Source: Home / Self Care | Attending: Family Medicine

## 2019-09-25 ENCOUNTER — Inpatient Hospital Stay (HOSPITAL_COMMUNITY): Payer: Medicare Other

## 2019-09-25 LAB — BASIC METABOLIC PANEL
Anion gap: 11 (ref 5–15)
BUN: 16 mg/dL (ref 8–23)
CO2: 21 mmol/L — ABNORMAL LOW (ref 22–32)
Calcium: 8.6 mg/dL — ABNORMAL LOW (ref 8.9–10.3)
Chloride: 103 mmol/L (ref 98–111)
Creatinine, Ser: 0.75 mg/dL (ref 0.61–1.24)
GFR calc Af Amer: >60 mL/min (ref >60)
GFR calc non Af Amer: >60 mL/min (ref >60)
Glucose, Bld: 112 mg/dL — ABNORMAL HIGH (ref 70–99)
Potassium: 3.9 mmol/L (ref 3.5–5.1)
Sodium: 135 mmol/L (ref 135–145)

## 2019-09-25 LAB — GLUCOSE, CAPILLARY: Glucose-Capillary: 106 mg/dL — ABNORMAL HIGH (ref 70–99)

## 2019-09-25 LAB — HEPARIN LEVEL (UNFRACTIONATED): Heparin Unfractionated: 0.26 IU/mL — ABNORMAL LOW (ref 0.30–0.70)

## 2019-09-25 SURGERY — ERCP, WITH INTERVENTION IF INDICATED
Anesthesia: General

## 2019-09-25 MED ORDER — QUETIAPINE FUMARATE 25 MG PO TABS
25.0000 mg | ORAL_TABLET | Freq: Every day | ORAL | Status: DC
  Administered 2019-09-26 – 2019-09-28 (×3): 25 mg via ORAL
  Filled 2019-09-25 (×3): qty 1

## 2019-09-25 MED ORDER — LORAZEPAM 2 MG/ML IJ SOLN
0.5000 mg | Freq: Four times a day (QID) | INTRAMUSCULAR | Status: DC | PRN
  Administered 2019-09-25 – 2019-09-27 (×5): 0.5 mg via INTRAVENOUS
  Filled 2019-09-25 (×5): qty 1

## 2019-09-25 MED ORDER — HYDRALAZINE HCL 20 MG/ML IJ SOLN
5.0000 mg | Freq: Four times a day (QID) | INTRAMUSCULAR | Status: DC | PRN
  Administered 2019-09-25 – 2019-09-27 (×3): 5 mg via INTRAVENOUS
  Filled 2019-09-25 (×3): qty 1

## 2019-09-25 MED ORDER — SODIUM CHLORIDE 0.9 % IV SOLN: INTRAVENOUS | Status: DC

## 2019-09-25 MED ORDER — LEVOTHYROXINE SODIUM 100 MCG/5ML IV SOLN
12.5000 ug | Freq: Every day | INTRAVENOUS | Status: DC
  Administered 2019-09-26 – 2019-09-29 (×3): 12.5 ug via INTRAVENOUS
  Filled 2019-09-25 (×5): qty 5

## 2019-09-25 MED ORDER — LORAZEPAM 2 MG/ML IJ SOLN
0.5000 mg | Freq: Once | INTRAMUSCULAR | Status: AC
  Administered 2019-09-25: 0.5 mg via INTRAVENOUS
  Filled 2019-09-25: qty 1

## 2019-09-25 MED ORDER — SODIUM CHLORIDE 0.9 % IV SOLN
2.0000 g | INTRAVENOUS | Status: DC
  Administered 2019-09-26 – 2019-09-29 (×4): 2 g via INTRAVENOUS
  Filled 2019-09-25: qty 20
  Filled 2019-09-25 (×2): qty 2
  Filled 2019-09-25: qty 20
  Filled 2019-09-25: qty 2

## 2019-09-25 NOTE — Progress Notes (Signed)
Iniciated soft  4 point wrist restraints on patient due to patient verbal, physical aggression, pulling on IV lines, and unsafe to self. Order obtained by MD and daughter updated.

## 2019-09-25 NOTE — Procedures (Signed)
Patient Name: Ronald Bird  MRN: [id number]  Epilepsy Attending: Lora Havens  Referring Physician/Provider: Dr. Fayrene Helper Date: 09/25/2019 Duration: 25.05 minutes  Patient history: 84 year old male with history of pancreatic cancer presented with altered mental status.  EEG to evaluate for seizures.  Level of alertness: Awake  AEDs during EEG study: Lorazepam  Technical aspects: This EEG study was done with scalp electrodes positioned according to the 10-20 International system of electrode placement. Electrical activity was acquired at a sampling rate of 500Hz  and reviewed with a high frequency filter of 70Hz  and a low frequency filter of 1Hz . EEG data were recorded continuously and digitally stored.   Description: During awake state, no clear posterior dominant was seen.  EEG showed continuous generalized 3 to 6 Hz theta-delta slowing admixed with an excessive amount of 15 to 18 Hz, 2-3 uV beta activity with irregular morphology distributed symmetrically and diffusely.  Physiologic photic driving was not seen during photic stimulation.  Hyperventilation was not performed.  Abnormality -Continuous slow, generalized - Excessive beta, generalized  IMPRESSION: This study is suggestive of moderate diffuse encephalopathy, nonspecific etiology. The excessive beta activity seen in the background is most likely due to the effect of benzodiazepine and is a benign EEG pattern. No seizures or epileptiform discharges were seen throughout the recording.  Ronald Bird

## 2019-09-25 NOTE — Progress Notes (Addendum)
SLP Cancellation Note  Patient Details Name: Ronald Bird MRN: [id number] DOB: [birth year]   Cancelled treatment:       Reason Eval/Treat Not Completed: Patient at procedure or test/unavailable SLE order acknowledged. MD with patient. ST will re-attempt as schedule allows.   Addendum: SLP returned 1:47pm. New BSE order seen and acknowledged. RN reports patient recently given Ativan. Patient sleeping, evaluation deferred. ST will continue efforts.   [name], M.Ed., CCC-SLP Speech Therapy Acute Rehabilitation [phone]: Acute Rehab office [phone] - pager    [name] 09/25/2019, 11:36 AM

## 2019-09-25 NOTE — Progress Notes (Signed)
EEG complete - results pending 

## 2019-09-25 NOTE — Consult Note (Signed)
   [facility] CM Inpatient Consult   09/25/2019  Ronald Bird 11/15/1935 [id number]   Patient screened for high risk score for unplanned readmission and for less than 7 days readmission hospitalization. Patient is in the [place] Medicare plan with [place] [employer] a benefit for services.  Review of patient's medical record reveals patient is currently admitted with altered mental status.  Chart briefly reviewed.  Primary Care Provider is listed as Christa See, MD at [facility] in [place], this office is listed to provide the follow up transition of care needs.  Pharmacy is: [facility] is listed  Plan:  Continue to follow progress and disposition to assess for post hospital care management needs with [facility].    Please place a [facility] Care Management consult as appropriate and for questions contact:   Natividad Brood, RN BSN [facility] Liaison  [phone] business mobile phone Toll free office [phone]  Fax number: [phone] [email or web address]

## 2019-09-25 NOTE — Progress Notes (Signed)
Ronald Bird for heparin Indication: atrial fibrillation  Allergies  Allergen Reactions  . Augmentin [Amoxicillin-Pot Clavulanate] Rash    Did it involve swelling of the face/tongue/throat, SOB, or low BP? No Did it involve sudden or severe rash/hives, skin peeling, or any reaction on the inside of your mouth or nose? No Did you need to seek medical attention at a hospital or doctor's office? Yes When did it last happen?Feb 2021 If all above answers are "NO", may proceed with cephalosporin use.    Patient Measurements: Height: 5\' 11"  (180.3 cm) Weight: 175 lb 14.8 oz (79.8 kg) IBW/kg (Calculated) : 75.3 Heparin Dosing Weight: n/a. TBW = 80 kg  Vital Signs: Temp: 98.4 F (36.9 C) (02/15 0911) Temp Source: Oral (02/15 0911) BP: 174/79 (02/15 0911) Pulse Rate: 68 (02/15 0911)  Labs: Recent Labs    09/23/19 0500 09/23/19 0500 09/24/19 0517 09/24/19 0843 09/24/19 1509 09/24/19 1605 09/24/19 1814 09/25/19 1115  HGB 11.1*   < > 11.7* 12.5*  --   --   --   --   HCT 32.4*  --  33.6* 36.4*  --   --   --   --   PLT 157  --  187 200  --   --   --   --   APTT  --   --   --  34  --   --   --   --   LABPROT  --   --   --  13.0  --   --   --   --   INR  --   --   --  1.0  --   --   --   --   HEPARINUNFRC  --   --   --   --   --  <0.10*  --  0.26*  CREATININE 0.77   < > 0.90 0.90  --   --   --  0.75  CKTOTAL  --   --   --  39*  --   --   --   --   TROPONINIHS  --   --   --   --  8  --  8  --    < > = values in this interval not displayed.    Estimated Creatinine Clearance: 77.1 mL/min (by C-G formula based on SCr of 0.75 mg/dL).   Medical History: Past Medical History:  Diagnosis Date  . BPH (benign prostatic hyperplasia)   . Cancer ([place])   . Coronary artery disease   . DJD (degenerative joint disease)   . GERD (gastroesophageal reflux disease)   . History of colonic polyps   . Hypercholesteremia   . Hypertension   . Pancreatic  adenocarcinoma ([place]) 09/18/2019    Medications:  Apixaban 5 mg PO BID home medication for atrial fibrillation. LD 09/21/19.   Assessment: Pt is a 83yoM admitted on 2/11 with a biliary obstruction. PMH significant for pancreatic cancer s/p biliary stenting on 09/14/19. Pt discharged from hospital on 2/10, but returned on 2/11 with itching and speech difficulty. MRI on 2/11 showed no acute intracranial abnormality.  Apixaban has been on hold during admission for possible ERCP with stent exchange. Code stroke with teleneurology consult called on 2/14 AM. CT head today shows no evidence of hemorrhage, no acute intracranial abnormality. MRI also shows no acute intracranial abnormality. Pharmacy consulted to initiate heparin drip for atrial fibrillation, will dose using CVA protocol dosing at this time  per discussion with MD.  Significant Events: -2/11 MRI: No acute intracranial abnormality. CT: negative for acute intracranial abnormality -2/14 Code stroke. CT negative, MRI negative  Today, 09/25/19 Heparin level 0.26  Goal of Therapy:  Heparin level 0.3-0.5 units/ml Monitor platelets by anticoagulation protocol: Yes   Plan:  Increase heparin to 1100 units / hr Follow up AM labs  Tad Moore, PharmD 09/25/2019,12:22 PM

## 2019-09-25 NOTE — Progress Notes (Signed)
Late Entry:  Patients daughter Virl Cagey  notified of pt change in condition & behavior and  Non-violence restraint order. Given the unit Number to daughter to call for an update as needed.

## 2019-09-25 NOTE — Progress Notes (Signed)
Late entry: arrived to pt room the nurses where transferring to another bed. Will try back later
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
## 2019-09-25 NOTE — Progress Notes (Signed)
Patient refusing to take pills, patient refusing to swallow. MD notified. Pending NPO orders and swallow eval.

## 2019-09-25 NOTE — Progress Notes (Signed)
Pt was found almost out of bed  Confused not following command ,pulled out IV line and  Blood noted on  bed linens. Assisted back to bed with 3 persons assist  Calmed down and bil hand mitten applied.  Pt cleaned and made comfortable in bed. Noted pt started singing. When offered po medications pt was not able to swallow  ,A  half teaspoon of  apple sauce given as a  trial before given the medication had to be  suctioned out from the mouth.Pt. was not following command to swallow..  Pt. kept NPO and Team MD and neurologist on call  made aware.   BP slightly high with SBP in 190's. MD made aware. RN will continue to monitor pt closely. Fall and Aspiration  precautions maintain,

## 2019-09-25 NOTE — Progress Notes (Signed)
NEUROLOGY PROGRESS NOTE    Subjective: He is very distractible.  Making inappropriate comments to male staff.  Able to follow commands.  Does not know where he is.    I had a discussion with the daughter who had seen him this morning.  She mentioned that he is very religious, she clearly sees that he is having delusional thoughts and possibly some hallucinations.  She did mention that she has noticed some short-term decline in memory over the last couple months.  She also stated when he was discharged from the hospital just recently, on the way home he became very angry and not his normal self.  The next morning upon wakening he regretted the way he behaved toward her and was crying.  At this point, seeing his demeanor, delusional thoughts she is okay with starting Seroquel to see if this could in fact help his mood.  Exam: Vitals:   09/25/19 0500 09/25/19 0709  BP: 139/80 (!) 149/85  Pulse: (!) 57 (!) 55  Resp: 16 17  Temp: 98.1 F (36.7 C) 98.4 F (36.9 C)  SpO2: 97% 97%    Physical Exam  Constitutional: Appears well-developed and well-nourished.  Psych: Very confused and making inappropriate comments to staff Eyes: No scleral injection HENT: No OP obstrucion Head: Normocephalic.  Cardiovascular: Palpable radial pulse bilaterally Respiratory: Effort normal, non-labored breathing GI: Soft.  No distension. There is no tenderness.  Skin: WDI   Neuro:  Mental Status: Confused, talking nonsensical, able to follow simple commands. Cranial Nerves: II:  Visual fields grossly normal,  III,IV, VI: ptosis not present, extra-ocular motions intact bilaterally pupils equal, round, reactive to light and accommodation V,VII: smile symmetric, facial light touch sensation normal bilaterally VIII: hearing normal bilaterally XI: bilateral shoulder shrug XII: midline tongue extension Motor: Right : Upper extremity   5/5    Left:     Upper extremity   5/5  Lower extremity   5/5     Lower  extremity   5/5 Tone and bulk:normal tone throughout; no atrophy noted Sensory: Intact throughout Deep Tendon Reflexes: 2+ bilateral upper extremities would not relax to obtain lower extremities    Medications:  Scheduled: . Chlorhexidine Gluconate Cloth  6 each Topical Daily  . hydrochlorothiazide  12.5 mg Oral Daily  . levothyroxine  25 mcg Oral QAC breakfast  . lisinopril  20 mg Oral BID  . metoprolol tartrate  25 mg Oral BID  . pantoprazole  40 mg Oral QAC breakfast  . polyethylene glycol  17 g Oral BID  . senna-docusate  2 tablet Oral QHS  . tamsulosin  0.4 mg Oral QHS  . traZODone  50 mg Oral QHS   Continuous: . [START ON 09/26/2019] cefTRIAXone (ROCEPHIN)  IV    . heparin 1,000 Units/hr (09/24/19 2247)    Pertinent Labs/Diagnostics: Ammonia-30 [id number] TSH-3.460 CMP-shows BUN of 18, creatinine 0.90, AST 113, ALT 142, potassium 3.1 CBC-White blood cell count 6.3, platelets 187 Urinalysis-negative Urine culture-negative Blood cultures pending  CT Code Stroke CTA Head W/WO contrast  Result Date: 09/24/2019 IMPRESSION: 1. No large vessel occlusion. 2. Intracranial atherosclerosis including moderate basilar and severe proximal right PCA stenoses. 3. Mild cervical carotid artery atherosclerosis without significant stenosis. 4. 3 mm left supraclinoid ICA aneurysm. Preliminary findings of no large vessel occlusion were communicated to Dr. Fayrene Helper at 8:16 am on 09/24/2019 by text page via the [facility] messaging system. Electronically Signed   By: Logan Bores M.D.   On: 09/24/2019 08:33  CT Code Stroke CTA Neck W/WO contrast  Result Date: 09/24/2019 IMPRESSION: 1. No large vessel occlusion. 2. Intracranial atherosclerosis including moderate basilar and severe proximal right PCA stenoses. 3. Mild cervical carotid artery atherosclerosis without significant stenosis. 4. 3 mm left supraclinoid ICA aneurysm. Preliminary findings of no large vessel occlusion were communicated to  Dr. Fayrene Helper at 8:16 am on 09/24/2019 by text page via the [facility] messaging system. Electronically Signed   By: Logan Bores M.D.   On: 09/24/2019 08:33   MR BRAIN WO CONTRAST  Result Date: 09/24/2019 IMPRESSION: 1. No acute intracranial abnormality. 2. Severe chronic small vessel ischemic disease. Electronically Signed   By: Logan Bores M.D.   On: 09/24/2019 10:59    EEG done and awaiting final reading  Etta Quill PA-C [facility] [phone]  I have seen the patient reviewed the above note.  He claims to be God, and indicates that if I not remove my mask he will burn it off of my face.  He indicates frequently that we are meeting for my judgment, and will not answer simple questions except with trying them around such as "where are you?"  Twitches responses "do you know where you are?"  Assessment:  84 year old male with history significant for pancreatic cancer, stent placement status post biliary obstruction.  2/11 2021 noted to be altered mental status.  MRI negative for acute stroke, CTA shows mild basilar stenosis that is moderate.    Delusions of grandeur can accompany delirious states, but are slightly unusual.  I do wonder about underlying neuropsychiatric illness such as mania coupled with neurocognitive decline. Either way, I think scheduled seroquel would be appropriate.   Impression: -Delirium --Underlying neurocognitive decline  Recommendations: -Awaiting final reading on EEG -Continue to evaluate for any metabolic abnormalities  -Start Seroquel-spoke with daughter and she is in agreement.  Roland Rack, MD [facility] [phone]  If 7pm- 7am, please page neurology on call as listed in [place].    09/25/2019, 9:09 AM
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
## 2019-09-25 NOTE — Progress Notes (Signed)
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 glycol  17 g Oral BID   QUEtiapine  25 mg Oral Q2000   senna-docusate  2 tablet Oral QHS  tamsulosin  0.4 mg Oral QHS   traZODone  50 mg Oral QHS   Continuous Infusions:  sodium chloride 75 mL/hr at 09/25/19 1303   [START ON 09/26/2019] cefTRIAXone (ROCEPHIN)  IV     heparin 1,100 Units/hr (09/25/19 1225)     LOS: 4 days    Time spent: 35 minutes.     Elmarie Shiley, MD [facility]   If 7PM-7AM, please contact night-coverage [email or web address]  09/25/2019, 4:15 PM

## 2019-09-25 NOTE — Progress Notes (Addendum)
Charge nurse is to perform all NIH scale assessments.
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
## 2019-09-26 LAB — CBC
HCT: 34.3 % — ABNORMAL LOW (ref 39.0–52.0)
Hemoglobin: 12.2 g/dL — ABNORMAL LOW (ref 13.0–17.0)
MCH: 32.7 pg (ref 26.0–34.0)
MCHC: 35.6 g/dL (ref 30.0–36.0)
MCV: 92 fL (ref 80.0–100.0)
Platelets: 231 10*3/uL (ref 150–400)
RBC: 3.73 MIL/uL — ABNORMAL LOW (ref 4.22–5.81)
RDW: 13.1 % (ref 11.5–15.5)
WBC: 7.5 10*3/uL (ref 4.0–10.5)
nRBC: 0 % (ref 0.0–0.2)

## 2019-09-26 LAB — COMPREHENSIVE METABOLIC PANEL
ALT: 148 U/L — ABNORMAL HIGH (ref 0–44)
AST: 103 U/L — ABNORMAL HIGH (ref 15–41)
Albumin: 3 g/dL — ABNORMAL LOW (ref 3.5–5.0)
Alkaline Phosphatase: 315 U/L — ABNORMAL HIGH (ref 38–126)
Anion gap: 9 (ref 5–15)
BUN: 12 mg/dL (ref 8–23)
CO2: 20 mmol/L — ABNORMAL LOW (ref 22–32)
Calcium: 8.4 mg/dL — ABNORMAL LOW (ref 8.9–10.3)
Chloride: 105 mmol/L (ref 98–111)
Creatinine, Ser: 0.86 mg/dL (ref 0.61–1.24)
GFR calc Af Amer: >60 mL/min (ref >60)
GFR calc non Af Amer: >60 mL/min (ref >60)
Glucose, Bld: 101 mg/dL — ABNORMAL HIGH (ref 70–99)
Potassium: 3.4 mmol/L — ABNORMAL LOW (ref 3.5–5.1)
Sodium: 134 mmol/L — ABNORMAL LOW (ref 135–145)
Total Bilirubin: 1.3 mg/dL — ABNORMAL HIGH (ref 0.3–1.2)
Total Protein: 5.9 g/dL — ABNORMAL LOW (ref 6.5–8.1)

## 2019-09-26 LAB — HEPARIN LEVEL (UNFRACTIONATED): Heparin Unfractionated: 0.31 IU/mL (ref 0.30–0.70)

## 2019-09-26 MED ORDER — POTASSIUM CHLORIDE 20 MEQ PO PACK
40.0000 meq | PACK | Freq: Once | ORAL | Status: AC
  Administered 2019-09-26: 40 meq via ORAL
  Filled 2019-09-26: qty 2

## 2019-09-26 NOTE — Progress Notes (Addendum)
Daily Rounding Note  09/26/2019, 8:33 AM  LOS: 5 days   SUBJECTIVE:   Chief complaint: Obstructing pancreatic cancer.    Remains confused with expressive aphasia.  Less agitated but remains in soft restraints.  No abdominal pain.  Was made n.p.o. after an incident of observed dysphagia.  Speech pathology currently in the room and feels he can   OBJECTIVE:         Vital signs in last 24 hours:    Temp:  [97.5 F (36.4 C)-98.8 F (37.1 C)] 97.9 F (36.6 C) (02/16 0759) Pulse Rate:  [61-90] 86 (02/16 0759) Resp:  [16-20] 20 (02/16 0759) BP: (130-174)/(46-94) 152/87 (02/16 0759) SpO2:  [97 %-100 %] 97 % (02/16 0759) Last BM Date: 09/24/19 Filed Weights   09/22/19 2110 09/24/19 0843  Weight: 84.5 kg 79.8 kg   General: Alert.  Expressive aphasia.  Moves all 4 limbs. Heart: RRR Chest: No labored breathing, no cough.  Lungs clear bilaterally. Abdomen: Not tender or distended.  No masses, HSM, bruits, hernias.  Active bowel sounds. Extremities: No CCE. Neuro/Psych: Expressive aphasia, gets frustrated when he cannot find the words but frequently answers questions with non sequitur answers.  Does follow commands.  Intake/Output from previous day: 02/15 0701 - 02/16 0700 In: 1267 [I.V.:1267] Out: 1400 [Urine:1400]  Intake/Output this shift: No intake/output data recorded.  Lab Results: Recent Labs    09/24/19 0517 09/24/19 0843 09/26/19 0241  WBC 6.3 7.1 7.5  HGB 11.7* 12.5* 12.2*  HCT 33.6* 36.4* 34.3*  PLT 187 200 231   BMET Recent Labs    09/24/19 0843 09/25/19 1115 09/26/19 0241  NA 133* 135 134*  K 3.1* 3.9 3.4*  CL 102 103 105  CO2 21* 21* 20*  GLUCOSE 120* 112* 101*  BUN 17 16 12   CREATININE 0.90 0.75 0.86  CALCIUM 8.4* 8.6* 8.4*   LFT Recent Labs    09/24/19 0517 09/26/19 0241  PROT 6.3* 5.9*  ALBUMIN 3.3* 3.0*  AST 113* 103*  ALT 142* 148*  ALKPHOS 334* 315*  BILITOT 0.9 1.3*    PT/INR Recent Labs    09/24/19 0843  LABPROT 13.0  INR 1.0   Hepatitis Panel No results for input(s): HEPBSAG, HCVAB, HEPAIGM, HEPBIGM in the last 72 hours.  Studies/Results: EEG  Result Date: 09/25/2019 Lora Havens, MD     09/25/2019  1:48 PM Patient Name: TALLIE MARITATO MRN: [id number] Epilepsy Attending: Lora Havens Referring Physician/Provider: Dr. Fayrene Helper Date: 09/25/2019 Duration: 25.05 minutes Patient history: 84 year old male with history of pancreatic cancer presented with altered mental status.  EEG to evaluate for seizures. Level of alertness: Awake AEDs during EEG study: Lorazepam Technical aspects: This EEG study was done with scalp electrodes positioned according to the 10-20 International system of electrode placement. Electrical activity was acquired at a sampling rate of 500Hz  and reviewed with a high frequency filter of 70Hz  and a low frequency filter of 1Hz . EEG data were recorded continuously and digitally stored. Description: During awake state, no clear posterior dominant was seen.  EEG showed continuous generalized 3 to 6 Hz theta-delta slowing admixed with an excessive amount of 15 to 18 Hz, 2-3 uV beta activity with irregular morphology distributed symmetrically and diffusely.  Physiologic photic driving was not seen during photic stimulation.  Hyperventilation was not performed. Abnormality -Continuous slow, generalized - Excessive beta, generalized IMPRESSION: This study is suggestive of moderate diffuse encephalopathy, nonspecific etiology. The excessive beta  activity seen in the background is most likely due to the effect of benzodiazepine and is a benign EEG pattern. No seizures or epileptiform discharges were seen throughout the recording. Lora Havens   DG Abd 1 View  Result Date: 09/24/2019 CLINICAL DATA:  Abdominal discomfort. EXAM: ABDOMEN - 1 VIEW COMPARISON:  None. FINDINGS: Large amount of stool in the LEFT colon, measuring  approximately 9 cm diameter. Mildly prominent small-bowel loops in the central abdomen. No evidence of free intraperitoneal air or abnormal fluid collection is seen. No acute appearing osseous abnormality within the visualized portion of the abdomen or pelvis. IMPRESSION: 1. Large amount of stool in the LEFT colon, measuring approximately 9 cm diameter. 2. Mildly prominent small-bowel loops in the central abdomen, raising the possibility of ileus or obstruction. Electronically Signed   By: Franki Cabot M.D.   On: 09/24/2019 15:03   MR BRAIN WO CONTRAST  Result Date: 09/24/2019 CLINICAL DATA:  Encephalopathy. EXAM: MRI HEAD WITHOUT CONTRAST TECHNIQUE: Multiplanar, multiecho pulse sequences of the brain and surrounding structures were obtained without intravenous contrast. COMPARISON:  Head CT 09/24/2019 and MRI 09/21/2019 FINDINGS: Brain: There is no evidence of acute infarct, intracranial hemorrhage, mass, midline shift, or extra-axial fluid collection. Cerebral atrophy is mild-to-moderate for age. Patchy and confluent T2 hyperintensities in the cerebral white matter bilaterally are unchanged from the recent prior MRI and are nonspecific but compatible with severe chronic small vessel ischemic disease. Vascular: Major intracranial vascular flow voids are preserved. Skull and upper cervical spine: No suspicious marrow lesion. C3-4 disc herniation with spinal stenosis, incompletely evaluated but similar to the recent prior MRI. Sinuses/Orbits: Bilateral cataract extraction. Paranasal sinuses and mastoid air cells are clear. Other: None. IMPRESSION: 1. No acute intracranial abnormality. 2. Severe chronic small vessel ischemic disease. Electronically Signed   By: Logan Bores M.D.   On: 09/24/2019 10:59   DG Abd Portable 2V  Result Date: 09/25/2019 CLINICAL DATA:  Constipation. EXAM: PORTABLE ABDOMEN - 2 VIEW COMPARISON:  09/24/2019 FINDINGS: Diffuse gaseous distention of small bowel is similar to prior. No  substantial stool volume evident. Biliary stent device again noted. Degenerative changes are evident in the lumbar spine. IMPRESSION: Prominent left colonic stool burden seen previously has resolved in the interval. Electronically Signed   By: Misty Stanley M.D.   On: 09/25/2019 14:47    ASSESMENT:   *    Pancreatic adenocarcinoma w malignant stricture.  ERCP, plastic temporary biliary stent placed 10/12/2019 Cholangitis, treated as inpatient 2/8 through 2/10. LFTs generally on improving, declining slope though T bili rising.  No fevers, normal WBCs.  *    Mental status changes.  MR brain 2/10, 2/14, CT head 2/14: Severe, chronic, small vessel ischemia. Neuro specialist attributing to behavioral health issues and has no issues with sedating patient for procedures.  *    Paroxysmal atrial fibrillation.  CAD.  Home Eliquis and Plavix on hold.  IV heparin in place.  Last Plavix, Eliquis > 1 week ago.   *    Hypokalemia.  K3.4.   PLAN   *     ERCP planned for 2/17 noon.  Will replace temporary, plastic biliary stent w permanent metal stent.  *   Stop IV heparin 5 hours before procedure.  *    Defer repletion/correction potassium to Dr. Tyrell Antonio.    *   Discussed the ERCP, risks, benefits with his daughter Stanton Kidney, phone [phone].  She understands and is agreeable to proceed.    Azucena Freed  09/26/2019, 8:33 AM Phone [phone]   ________________________________________________________________________  Velora Heckler GI MD note:  I personally examined the patient, reviewed the data and agree with the assessment and plan described above.  HE's actually a bit more clear mentally this morning.  I am planning on ERCP tomorrow to remove the previous plastic stent and place a permanent metal one.    Owens Loffler, MD [facility] Pager [phone]

## 2019-09-26 NOTE — Progress Notes (Signed)
NEUROLOGY PROGRESS NOTE   Subjective: Much improved. Able to state month, follow commands but stil confused and talking about nonsensical things.   Exam: Vitals:   09/26/19 0352 09/26/19 0759  BP: (!) 170/94 (!) 152/87  Pulse: 90 86  Resp: 20 20  Temp: 98.3 F (36.8 C) 97.9 F (36.6 C)  SpO2: 97% 97%    Physical Exam  Constitutional: Appears well-developed and well-nourished.  Psych: Affect appropriate to situation Eyes: No scleral injection HENT: No OP obstrucion Head: Normocephalic.  Cardiovascular:palpable.  Respiratory: Effort normal, non-labored breathing GI: Soft.  No distension. There is no tenderness.  Skin: WDI   Neuro:  Mental Status: Alert, oriented to hospital and February but still confused and talking about chest and random people.  Speech fluent without evidence of aphasia.  Able to follow simple commands without difficulty. Cranial Nerves: II:  Visual fields grossly normal,  III,IV, VI: ptosis not present, extra-ocular motions intact bilaterally pupils equal, round, reactive to light and accommodation V,VII: smile symmetric, facial light touch sensation normal bilaterally VIII: hearing normal bilaterally Motor: Moving all extremities with good strength Sensory: Pinprick and light touch intact throughout, bilaterally    Medications:  Scheduled: . Chlorhexidine Gluconate Cloth  6 each Topical Daily  . hydrochlorothiazide  12.5 mg Oral Daily  . levothyroxine  12.5 mcg Intravenous Daily  . lisinopril  20 mg Oral BID  . metoprolol tartrate  25 mg Oral BID  . pantoprazole  40 mg Oral QAC breakfast  . polyethylene glycol  17 g Oral BID  . QUEtiapine  25 mg Oral Q2000  . senna-docusate  2 tablet Oral QHS  . tamsulosin  0.4 mg Oral QHS   Continuous: . sodium chloride 75 mL/hr at 09/26/19 0226  . cefTRIAXone (ROCEPHIN)  IV 2 g (09/26/19 0539)  . heparin 1,100 Units/hr (09/25/19 1225)   [id number], Glycerin (Adult), hydrALAZINE, ibuprofen,  LORazepam, nitroGLYCERIN, ondansetron **OR** ondansetron (ZOFRAN) IV  Pertinent Labs/Diagnostics:   EEG  Result Date: 09/25/2019  IMPRESSION: This study is suggestive of moderate diffuse encephalopathy, nonspecific etiology. The excessive beta activity seen in the background is most likely due to the effect of benzodiazepine and is a benign EEG pattern. No seizures or epileptiform discharges were seen throughout the recording. Priyanka Barbra Sarks     Impression: - delirium -underlying cognitive decline  Recommendations: -Continue Seroquel - good sleep wake scheduale -from neurology stand point ok of ERCP tomorrow  Etta Quill PA-C [facility] [phone]  09/26/2019, 10:56 AM

## 2019-09-26 NOTE — Evaluation (Signed)
Speech Language Pathology Evaluation Patient Details Name: Ronald Bird MRN: [id number] DOB: 1936/07/21 Today's Date: 09/26/2019 Time: [id number] SLP Time Calculation (min) (ACUTE ONLY): 31 min  Problem List:  Patient Active Problem List   Diagnosis Date Noted  . Dysarthria 09/21/2019  . Confusion 09/21/2019  . Hypothyroidism 09/21/2019  . Drug reaction 09/21/2019  . Biliary sepsis   . Acute cholangitis 09/18/2019  . Hypokalemia 09/18/2019  . Hypomagnesemia 09/18/2019  . Pancreatic adenocarcinoma ([place]) 09/18/2019  . Biliary obstruction   . Pancreatic lesion   . Coronary artery disease 09/23/2018  . Unstable angina ([place]) 09/08/2018  . Paroxysmal atrial fibrillation ([place]) 08/30/2017  . Ascending aortic aneurysm ([place]) 08/30/2017  . Palpitations 03/29/2017  . Weakness 12/30/2010  . Fatigue 12/30/2010  . HYPERCHOLESTEROLEMIA 07/03/2008  . COLONIC POLYPS 05/15/2008  . Essential hypertension 05/15/2008  . GERD 05/15/2008  . DEGENERATIVE JOINT DISEASE 05/15/2008  . BENIGN PROSTATIC HYPERTROPHY, HX OF 05/15/2008   Past Medical History:  Past Medical History:  Diagnosis Date  . BPH (benign prostatic hyperplasia)   . Cancer ([place])   . Coronary artery disease   . DJD (degenerative joint disease)   . GERD (gastroesophageal reflux disease)   . History of colonic polyps   . Hypercholesteremia   . Hypertension   . Pancreatic adenocarcinoma ([place]) 09/18/2019   Past Surgical History:  Past Surgical History:  Procedure Laterality Date  . BILIARY STENT PLACEMENT N/A 09/14/2019   Procedure: BILIARY STENT PLACEMENT;  Surgeon: Milus Banister, MD;  Location: [facility];  Service: Endoscopy;  Laterality: N/A;  . CATARACT EXTRACTION, BILATERAL  2012   In [facility]  . CORONARY ATHERECTOMY N/A 09/12/2018   Procedure: CORONARY ATHERECTOMY;  Surgeon: Jettie Booze, MD;  Location: [place] [facility];  Service: Cardiovascular;  Laterality: N/A;  mid LAD  . CORONARY BALLOON  ANGIOPLASTY N/A 09/12/2018   Procedure: CORONARY BALLOON ANGIOPLASTY;  Surgeon: Jettie Booze, MD;  Location: [place] [facility];  Service: Cardiovascular;  Laterality: N/A;  diag 1  . CORONARY STENT INTERVENTION N/A 09/12/2018   Procedure: CORONARY STENT INTERVENTION;  Surgeon: Jettie Booze, MD;  Location: [place] [facility];  Service: Cardiovascular;  Laterality: N/A;  mid LAD  . ENDOSCOPIC RETROGRADE CHOLANGIOPANCREATOGRAPHY (ERCP) WITH PROPOFOL N/A 09/14/2019   Procedure: ENDOSCOPIC RETROGRADE CHOLANGIOPANCREATOGRAPHY (ERCP) WITH PROPOFOL;  Surgeon: Milus Banister, MD;  Location: [facility];  Service: Endoscopy;  Laterality: N/A;  . ESOPHAGOGASTRODUODENOSCOPY (EGD) WITH PROPOFOL N/A 09/14/2019   Procedure: ESOPHAGOGASTRODUODENOSCOPY (EGD) WITH PROPOFOL;  Surgeon: Milus Banister, MD;  Location: [facility];  Service: Endoscopy;  Laterality: N/A;  . EUS N/A 09/14/2019   Procedure: UPPER ENDOSCOPIC ULTRASOUND (EUS) RADIAL;  Surgeon: Milus Banister, MD;  Location: [facility];  Service: Endoscopy;  Laterality: N/A;  CAT 2  . FINE NEEDLE ASPIRATION N/A 09/14/2019   Procedure: FINE NEEDLE ASPIRATION (FNA) LINEAR;  Surgeon: Milus Banister, MD;  Location: [facility];  Service: Endoscopy;  Laterality: N/A;  . LEFT HEART CATH AND CORONARY ANGIOGRAPHY N/A 09/09/2018   Procedure: LEFT HEART CATH AND CORONARY ANGIOGRAPHY;  Surgeon: Burnell Blanks, MD;  Location: [place] [facility];  Service: Cardiovascular;  Laterality: N/A;  . LEFT HEART CATH AND CORONARY ANGIOGRAPHY N/A 09/12/2018   Procedure: LEFT HEART CATH AND CORONARY ANGIOGRAPHY;  Surgeon: Jettie Booze, MD;  Location: [place] [facility];  Service: Cardiovascular;  Laterality: N/A;  . MOHS SURGERY  2012   Skin Cancer removed from nose by Moh's in  [place]  . SHOULDER SURGERY     HPI:  pt is an 84 yo male adm to [facility] with obstructed pancreatic cancer- newly diagnosed this year. Pt is s/p biliary stent 2/4- is scheduled  for ERCP tomorrow.  Pt presented with word finding deficits and decreased short term memory problems.  His MRI was negative for acute findings.  A rash had developed on his hands and chest after augmentin intake.  Pt EEG c/w moderate diffuse encephalopathy.  Neuro questioned underlying neuropsych dx such as mania coupled with neurocognitive deficits.  Speech/swallow evaluation ordered. Daughter had reported to MD progressive memory deficits in the last few month.   Assessment / Plan / Recommendation Clinical Impression  Currently pt is presenting with severe cognitive linguistic deficits impacting his attention, problem solving with both expressive and receptive language deficits.  He was oriented to name and current state but not to situation nor specific location.  Automatic speech tasks were completed with mod cues overall - suspect more due to inattention.  Perseveration regarding the lift in the room observed as pt wanted SLP to "pull the string"and demonstrated impaired ability to transfer attention to other tasks.  Islands of fluent speech/language apparent but did not pertain to situation approx 80% of opportunities.  Basic yes/no questions or verbal choices will help mitigate pt's communication.  No dysarthria apparent at this time.    Hopeful for improvement if pt is demonstrating delirium to functional level for basic communication.    SLP Assessment  SLP Recommendation/Assessment: Patient needs continued Speech Lanaguage Pathology Services SLP Visit Diagnosis: Cognitive communication deficit (R41.841)    Follow Up Recommendations  Skilled Nursing facility    Frequency and Duration min 2x/week  1 week      SLP Evaluation Cognition  Overall Cognitive Status: Impaired/Different from baseline Orientation Level: Oriented to person(oriented to state) Attention: Focused;Sustained Focused Attention: Appears intact Sustained Attention: Impaired Awareness: Impaired(pt trying to take off  his mitt (one off the dominant hand) despite cues needed for safety) Behaviors: Restless;Impulsive Safety/Judgment: Impaired       Comprehension  Auditory Comprehension Overall Auditory Comprehension: Impaired Yes/No Questions: Impaired Basic Biographical Questions: 0-25% accurate Basic Immediate Environment Questions: 0-24% accurate Commands: Impaired One Step Basic Commands: 0-24% accurate Other Conversation Comments: followed one step directions inconsistently Interfering Components: Attention EffectiveTechniques: Visual/Gestural cues Visual Recognition/Discrimination Discrimination: (pt with decreased sustained attention, difficult to assess) Reading Comprehension Reading Status: Not tested    Expression Expression Primary Mode of Expression: Verbal Verbal Expression Overall Verbal Expression: Impaired Automatic Speech: Counting(counting 1-10 with mod cues for last 3 numbers suspected due to decr attention) Repetition: Impaired Level of Impairment: Word level Naming: Not tested(pt did not identify items on his table, session modified to his needs given his agitation during hospital coarse) Pragmatics: Impairment Impairments: Turn Taking;Eye contact Interfering Components: Attention Other Verbal Expression Comments: pt noted to perseverate on lift in his room therefore it was covered by RN in hopes to distract him, [place] of fluent speech noted "Let me go". I can hang it" and he was able to state his wife's name to PA for GI Written Expression Dominant Hand: Right(pt states both hands dominant, ? comprehension to information) Written Expression: Not tested   Oral / Motor  Oral Motor/Sensory Function Overall Oral Motor/Sensory Function: Within functional limits(pt decreased ability to follow directions) Motor Speech Respiration: Within functional limits Resonance: Within functional limits Articulation: Within functional limitis Intelligibility: Intelligible Motor  Planning: Impaired Motor Speech Errors: Groping for words;Inconsistent  [place], Clista Rainford Ann 09/26/2019, 10:13 AM  Kathleen Lime, MS [place] Office [phone]

## 2019-09-26 NOTE — Progress Notes (Signed)
PROGRESS NOTE    Ronald Bird  [id number] DOB: 08-09-1936 DOA: 09/21/2019 PCP: Emmaline Kluver, MD   Brief Narrative: 84 year old with past medical history significant for pancreatic cancer with recent a stent placed status post biliary obstruction on 12//2021, hyperlipidemia, A. fib, hypertension, CAD, hypothyroidism, BPH who was discharged from hospital 09-20-2019.  Patient was admitted on 09/18/2019 for biliary sepsis acute cholangitis.  He was a started on oral Augmentin.  He had elevated liver function test that were trending down on discharge and his statins was held.  Per daughter patient was not feeling well the day prior to readmission, was off.  He developed a rash in his hands arms and chest after taking Augmentin.  He presented to the ED on 09-21-2019 with altered mental status, word finding difficulties.  Since his readmission GI was reconsulted and he was placed on ciprofloxacin for cholangitis with plan for a stent exchange on 12/15.  He had an MRI on presentation for his aphasia, which was negative for stroke. On 2/14, patient was worse again, becoming unresponsive at times with waxing and waning encephalopathy.  Neurology was consulted and plan was to transfer to [facility] for EEG and additional neurology evaluation.  Of note patient had an episode of bradycardia and hypotension and unresponsiveness that improved with trend [place] position.  This was thought to be related to vasovagal response.     Assessment & Plan:   Active Problems:   HYPERCHOLESTEROLEMIA   Essential hypertension   Paroxysmal atrial fibrillation (HCC)   Coronary artery disease   Biliary obstruction   Pancreatic adenocarcinoma (HCC)   Dysarthria   Confusion   Hypothyroidism   Drug reaction   1-Acute metabolic encephalopathy, word finding difficulty, delirium Delirium, multifactorial; infectious process cholangitis, antibiotics (Cipro)  MRI negative for Stroke. EEG negative for seizure.    PRN IV ativan ordered.  B 12, TSH , Ammonia level normal.  Follow Blood culture. No growth to date.  Patient is alert and oriented x2, he is able to follow commands.  Improved today.  Still with some delusions. Continue with Seroquel  2-Vasovagal episode, hypotension bradycardia: Resolved  Troponin negative.   3-Abdominal pain secondary to constipation and acute urinary retention: KUB; fecal impaction resolved.  Urinating so far.  Continue with bowel regimen.    4-Cholangitis, Biliary Obstruction, Pancreatic Adenocarcinoma;  Ciprofloxacin change to ceftriaxone to avoid worsening delirium.  Needs stent exchange to metal during this admission.  Plan for stent exchange on 2/17.  5-Drug reaction Augmentin; resolved.   6-paroxysmal A fib;  On BB, Heparin Gtt.  Holding eliquis for GI procedure.   HTN; PRN hydralazine.  Continue with hydrochlorothiazide, lisinopril, metoprolol.  BPH; continue with Flomax.   Hypokalemia: Replete orally.  Estimated body mass index is 24.54 kg/m as calculated from the following:   Height as of this encounter: 5\' 11"  (1.803 m).   Weight as of this encounter: 79.8 kg.   DVT prophylaxis: heparin Code Status: DNR after discussion with daughter Family Communication: Daughter 2-15 Disposition Plan:  Patient is from: Home Anticipated d/c date: to be determine, needs PT evaluation  Barriers to d/c or necessity for inpatient status: worsening delirium, agitated not stable to be discharge. He will need ERCP was stent exchange.   Consultants:   Neurology  GI  Procedures:   EEG; negative for seizure  Antimicrobials:  Ceftriaxone 2-15  Subjective: Patient is alert, oriented x2.  He was able to tell me his name, age, location. Expressing some delusional thoughts  Objective: Vitals:   09/26/19 0033 09/26/19 0352 09/26/19 0759 09/26/19 1226  BP: (!) 155/83 (!) 170/94 (!) 152/87 (!) 171/98  Pulse: 80 90 86 74  Resp: 20 20 20 20   Temp:  98.4 F (36.9 C) 98.3 F (36.8 C) 97.9 F (36.6 C) 98.5 F (36.9 C)  TempSrc: Oral Oral Oral Oral  SpO2: 97% 97% 97% 98%  Weight:      Height:        Intake/Output Summary (Last 24 hours) at 09/26/2019 1352 Last data filed at 09/26/2019 0331 Gross per 24 hour  Intake 1267.03 ml  Output 900 ml  Net 367.03 ml   Filed Weights   09/22/19 2110 09/24/19 0843  Weight: 84.5 kg 79.8 kg    Examination:  General exam: Alert, no acute distress following commands Respiratory system: Clear to auscultation Cardiovascular system: S1, S2 regular rhythm or rate Gastrointestinal system: Bowel sounds present, soft nontender nondistended Central nervous system: Alert and oriented x2, following commands, calm Extremities: No edema     Data Reviewed: I have personally reviewed following labs and imaging studies  CBC: Recent Labs  Lab 09/20/19 0253 09/20/19 0253 09/21/19 1305 09/21/19 1305 09/22/19 0424 09/23/19 0500 09/24/19 0517 09/24/19 0843 09/26/19 0241  WBC 5.6   < > 6.2   < > 5.2 5.7 6.3 7.1 7.5  NEUTROABS 4.0  --  4.5  --  3.4 3.7 3.9  --   --   HGB 11.0*   < > 12.6*   < > 11.1* 11.1* 11.7* 12.5* 12.2*  HCT 32.4*   < > 36.6*   < > 32.9* 32.4* 33.6* 36.4* 34.3*  MCV 96.4   < > 94.1   < > 95.9 95.0 94.6 94.5 92.0  PLT 118*   < > 152   < > 156 157 187 200 231   < > = values in this interval not displayed.   Basic Metabolic Panel: Recent Labs  Lab 09/21/19 1330 09/22/19 0752 09/23/19 0500 09/24/19 0517 09/24/19 0843 09/25/19 1115 09/26/19 0241  NA  --    < > 134* 135 133* 135 134*  K  --    < > 3.5 3.1* 3.1* 3.9 3.4*  CL  --    < > 104 104 102 103 105  CO2  --    < > 22 23 21* 21* 20*  GLUCOSE  --    < > 118* 129* 120* 112* 101*  BUN  --    < > 19 18 17 16 12   CREATININE  --    < > 0.77 0.90 0.90 0.75 0.86  CALCIUM  --    < > 8.5* 8.7* 8.4* 8.6* 8.4*  MG 2.1  --  1.8 1.8  --   --   --   PHOS  --   --  3.5 3.2  --   --   --    < > = values in this interval not  displayed.   GFR: Estimated Creatinine Clearance: 71.7 mL/min (by C-G formula based on SCr of 0.86 mg/dL). Liver Function Tests: Recent Labs  Lab 09/21/19 1305 09/22/19 0752 09/23/19 0500 09/24/19 0517 09/26/19 0241  AST 143* 138* 106* 113* 103*  ALT 176* 159* 134* 142* 148*  ALKPHOS 403* 364* 352* 334* 315*  BILITOT 2.0* 1.4* 1.1 0.9 1.3*  PROT 6.8 5.6* 5.9* 6.3* 5.9*  ALBUMIN 3.5 3.0* 3.2* 3.3* 3.0*   Recent Labs  Lab 09/21/19 1305 09/24/19 1509  LIPASE 12 13  Recent Labs  Lab 09/24/19 0959  AMMONIA 30   Coagulation Profile: Recent Labs  Lab 09/21/19 1330 09/22/19 0424 09/24/19 0843  INR 1.1 1.1 1.0   Cardiac Enzymes: Recent Labs  Lab 09/24/19 0843  CKTOTAL 39*   BNP (last 3 results) No results for input(s): PROBNP in the last 8760 hours. HbA1C: No results for input(s): HGBA1C in the last 72 hours. CBG: Recent Labs  Lab 09/24/19 0729 09/24/19 1407 09/24/19 2011 09/25/19 0323  GLUCAP 108* 110* 135* 106*   Lipid Profile: No results for input(s): CHOL, HDL, LDLCALC, TRIG, CHOLHDL, LDLDIRECT in the last 72 hours. Thyroid Function Tests: Recent Labs    09/24/19 1940  TSH 3.460   Anemia Panel: Recent Labs    09/24/19 1940  VITAMINB12 1,424*  FOLATE 30.1   Sepsis Labs: Recent Labs  Lab 09/21/19 1305 09/24/19 1509 09/24/19 1814  LATICACIDVEN 0.9 1.1 0.9    Recent Results (from the past 240 hour(s))  Urine culture     Status: Abnormal   Collection Time: 09/18/19 11:50 AM   Specimen: Urine, Clean Catch  Result Value Ref Range Status   Specimen Description   Final    URINE, CLEAN CATCH Performed at [facility], [place] [address] [place], [place] [address]    Special Requests   Final    NONE Performed at [facility], [place] [address] [place], [place] [address]    Culture MULTIPLE SPECIES PRESENT, SUGGEST RECOLLECTION (A)  Final   Report Status 09/19/2019 FINAL  Final  Blood culture (routine x  2)     Status: None   Collection Time: 09/18/19 12:01 PM   Specimen: BLOOD  Result Value Ref Range Status   Specimen Description   Final    BLOOD LEFT ANTECUBITAL Performed at [facility] [address] [place], [place] [address]    Special Requests   Final    BOTTLES DRAWN AEROBIC AND ANAEROBIC Blood Culture adequate volume Performed at [place] [address] [place], [place] [address]    Culture   Final    NO GROWTH 5 DAYS Performed at [facility], [place] [address] [place], [place] [address]    Report Status 09/23/2019 FINAL  Final  Respiratory Panel by RT PCR (Flu A&B, Covid) - Nasopharyngeal Swab     Status: None   Collection Time: 09/18/19 12:54 PM   Specimen: Nasopharyngeal Swab  Result Value Ref Range Status   SARS Coronavirus 2 by RT PCR NEGATIVE NEGATIVE Final    Comment: (NOTE) SARS-CoV-2 target nucleic acids are NOT DETECTED. The SARS-CoV-2 RNA is generally detectable in upper respiratoy specimens during the acute phase of infection. The lowest concentration of SARS-CoV-2 viral copies this assay can detect is 131 copies/mL. A negative result does not preclude SARS-Cov-2 infection and should not be used as the sole basis for treatment or other patient management decisions. A negative result may occur with  improper specimen collection/handling, submission of specimen other than nasopharyngeal swab, presence of viral mutation(s) within the areas targeted by this assay, and inadequate number of viral copies (<131 copies/mL). A negative result must be combined with clinical observations, patient history, and epidemiological information. The expected result is Negative. Fact Sheet for Patients:  PinkCheek.be Fact Sheet for Healthcare Providers:  GravelBags.it This test is not yet ap proved or cleared by the [place] FDA and  has been authorized for  detection and/or diagnosis of SARS-CoV-2 by FDA under an  Emergency Use Authorization (EUA). This EUA will remain  in effect (meaning this test can be used) for the duration of the COVID-19 declaration under Section 564(b)(1) of the Act, 21 U.S.C. section 360bbb-3(b)(1), unless the authorization is terminated or revoked sooner.    Influenza A by PCR NEGATIVE NEGATIVE Final   Influenza B by PCR NEGATIVE NEGATIVE Final    Comment: (NOTE) The Xpert Xpress SARS-CoV-2/FLU/RSV assay is intended as an aid in  the diagnosis of influenza from Nasopharyngeal swab specimens and  should not be used as a sole basis for treatment. Nasal washings and  aspirates are unacceptable for Xpert Xpress SARS-CoV-2/FLU/RSV  testing. Fact Sheet for Patients: PinkCheek.be Fact Sheet for Healthcare Providers: GravelBags.it This test is not yet approved or cleared by the [place] FDA and  has been authorized for detection and/or diagnosis of SARS-CoV-2 by  FDA under an Emergency Use Authorization (EUA). This EUA will remain  in effect (meaning this test can be used) for the duration of the  Covid-19 declaration under Section 564(b)(1) of the Act, 21  U.S.C. section 360bbb-3(b)(1), unless the authorization is  terminated or revoked. Performed at [facility], [place] [address] [place], [place] [address]   Blood culture (routine x 2)     Status: None   Collection Time: 09/18/19  4:41 PM   Specimen: BLOOD  Result Value Ref Range Status   Specimen Description   Final    BLOOD RIGHT ANTECUBITAL Performed at [place] [address] [place], [place] [address]    Special Requests   Final    BOTTLES DRAWN AEROBIC AND ANAEROBIC Blood Culture adequate volume Performed at [place] [address], [place], [place] [address]    Culture   Final    NO GROWTH 5 DAYS Performed at [facility], [place] [address], [place], [place] [address]    Report Status 09/23/2019 FINAL  Final  Urine culture     Status: None   Collection Time: 09/21/19  1:05 PM   Specimen: Urine, Clean Catch  Result Value Ref Range Status   Specimen Description   Final    URINE, CLEAN CATCH Performed at [facility], [place] [address] [place], [place] [address]    Special Requests   Final    NONE Performed at [facility], [place] [address], [place], [place] [address]    Culture   Final    NO GROWTH Performed at [facility], [place] [address] [place],  [address]    Report Status 09/23/2019 FINAL  Final  MRSA PCR Screening     Status: None   Collection Time: 09/24/19  8:19 AM   Specimen: Nasal Mucosa; Nasopharyngeal  Result Value Ref Range Status   MRSA by PCR NEGATIVE NEGATIVE Final    Comment:        The GeneXpert MRSA Assay (FDA approved for NASAL specimens only), is one component of a comprehensive MRSA colonization surveillance program. It is not intended to diagnose MRSA infection nor to guide or monitor treatment for MRSA infections. Performed at [facility], [place] [address], [place],  [address]   Culture, blood (routine x 2)     Status: None (Preliminary result)   Collection Time: 09/24/19  7:26 PM   Specimen: BLOOD  Result Value Ref Range Status   Specimen Description   Final    BLOOD LEFT ARM Performed at [facility],  [place] [address] [place], [place] [address]    Special Requests   Final    BOTTLES DRAWN AEROBIC AND ANAEROBIC Blood Culture adequate volume Performed at [place] [address], [place], [place] [address]    Culture   Final    NO GROWTH 1 DAY Performed at [facility], [place] [address], [place], [place] [address]    Report Status PENDING  Incomplete  Culture, blood (routine x 2)     Status: None (Preliminary result)   Collection Time:  09/24/19  7:26 PM   Specimen: BLOOD  Result Value Ref Range Status   Specimen Description   Final    BLOOD RIGHT ARM Performed at [place] [address] [place], [place] [address]    Special Requests   Final    BOTTLES DRAWN AEROBIC AND ANAEROBIC Blood Culture adequate volume Performed at [place] [address] [place], [place] [address]    Culture   Final    NO GROWTH 1 DAY Performed at [facility], [place] [address], [place], [place] [address]    Report Status PENDING  Incomplete         Radiology Studies: EEG  Result Date: 09/25/2019 Lora Havens, MD     09/25/2019  1:48 PM Patient Name: Ronald Bird MRN: [id number] Epilepsy Attending: Lora Havens Referring Physician/Provider: Dr. Fayrene Helper Date: 09/25/2019 Duration: 25.05 minutes Patient history: 84 year old male with history of pancreatic cancer presented with altered mental status.  EEG to evaluate for seizures. Level of alertness: Awake AEDs during EEG study: Lorazepam Technical aspects: This EEG study was done with scalp electrodes positioned according to the 10-20 International system of electrode placement. Electrical activity was acquired at a sampling rate of 500Hz  and reviewed with a high frequency filter of 70Hz  and a low frequency filter of 1Hz . EEG data were recorded continuously and digitally stored. Description: During awake state, no clear posterior dominant was seen.  EEG showed continuous generalized 3 to 6 Hz theta-delta slowing admixed with an excessive amount of 15 to 18 Hz, 2-3 uV beta activity with irregular morphology distributed symmetrically and diffusely.  Physiologic photic driving was not seen during photic stimulation.  Hyperventilation was not performed. Abnormality -Continuous slow, generalized - Excessive beta, generalized IMPRESSION: This study is suggestive of moderate diffuse encephalopathy, nonspecific etiology. The excessive beta  activity seen in the background is most likely due to the effect of benzodiazepine and is a benign EEG pattern. No seizures or epileptiform discharges were seen throughout the recording. Lora Havens   DG Abd 1 View  Result Date: 09/24/2019 CLINICAL DATA:  Abdominal discomfort. EXAM: ABDOMEN - 1 VIEW COMPARISON:  None. FINDINGS: Large amount of stool in the LEFT colon, measuring approximately 9 cm diameter. Mildly prominent small-bowel loops in the central abdomen. No evidence of free intraperitoneal air or abnormal fluid collection is seen. No acute appearing osseous abnormality within the visualized portion of the abdomen or pelvis. IMPRESSION: 1. Large amount of stool in the LEFT colon, measuring approximately 9 cm diameter. 2. Mildly prominent small-bowel loops in the central abdomen, raising the possibility of ileus or obstruction. Electronically Signed   By: Franki Cabot M.D.   On: 09/24/2019 15:03   DG Abd Portable 2V  Result Date: 09/25/2019 CLINICAL DATA:  Constipation. EXAM: PORTABLE ABDOMEN - 2 VIEW COMPARISON:  09/24/2019 FINDINGS: Diffuse gaseous distention of small bowel is similar to prior. No substantial stool  volume evident. Biliary stent device again noted. Degenerative changes are evident in the lumbar spine. IMPRESSION: Prominent left colonic stool burden seen previously has resolved in the interval. Electronically Signed   By: Misty Stanley M.D.   On: 09/25/2019 14:47        Scheduled Meds:  Chlorhexidine Gluconate Cloth  6 each Topical Daily   hydrochlorothiazide  12.5 mg Oral Daily   levothyroxine  12.5 mcg Intravenous Daily   lisinopril  20 mg Oral BID   metoprolol tartrate  25 mg Oral BID   pantoprazole  40 mg Oral QAC breakfast   polyethylene glycol  17 g Oral BID   QUEtiapine  25 mg Oral Q2000   senna-docusate  2 tablet Oral QHS   tamsulosin  0.4 mg Oral QHS   Continuous Infusions:  sodium chloride 75 mL/hr at 09/26/19 0226   cefTRIAXone  (ROCEPHIN)  IV 2 g (09/26/19 0539)   heparin 1,100 Units/hr (09/25/19 1225)     LOS: 5 days    Time spent: 35 minutes.     Elmarie Shiley, MD [facility]   If 7PM-7AM, please contact night-coverage [email or web address]  09/26/2019, 1:52 PM

## 2019-09-26 NOTE — Plan of Care (Signed)
  Problem: Pain Managment: Goal: General experience of comfort will improve Outcome: Progressing   Problem: Safety: Goal: Ability to remain free from injury will improve Outcome: Progressing   Problem: Education: Goal: Knowledge of disease or condition will improve Outcome: Progressing Goal: Knowledge of secondary prevention will improve Outcome: Progressing Goal: Knowledge of patient specific risk factors addressed and post discharge goals established will improve Outcome: Progressing   Problem: Coping: Goal: Will verbalize positive feelings about self Outcome: Progressing Goal: Will identify appropriate support needs Outcome: Progressing   Problem: Health Behavior/Discharge Planning: Goal: Ability to manage health-related needs will improve Outcome: Progressing   Problem: Self-Care: Goal: Ability to participate in self-care as condition permits will improve Outcome: Progressing   Problem: Nutrition: Goal: Risk of aspiration will decrease Outcome: Progressing

## 2019-09-26 NOTE — TOC Initial Note (Signed)
Transition of Care [facility]) - Initial/Assessment Note    Patient Details  Name: Ronald Bird MRN: [id number] Date of Birth: 01/10/1936  Transition of [facility] CM/SW Contact:    Pollie Friar, RN Phone Number: 09/26/2019, 1:50 PM  Clinical Narrative:                 CM met with the patients daughter. She states the patient has required some assistance at home in the recent past but this has really increased since his cancer diagnosis.  CM has asked MD for PT/OT eval.  Daughter wanting SNF if recommended by PT/OT. The family is not able to provide the needed assistance at home. Daughter asking for a SNF near her in [place]. She is going to talk with friends and let CM know her choices. She next would like a facility in [place] and then [place].  TOC following.  Expected Discharge Plan: Skilled Nursing Facility Barriers to Discharge: Continued Medical Work up   Patient Goals and CMS Choice   CMS Medicare.gov Compare Post Acute Care list provided to:: Patient Represenative (must comment) Choice offered to / list presented to : Adult Children  Expected Discharge Plan and Services Expected Discharge Plan: Skilled Nursing Facility In-house Referral: Clinical Social Work Discharge Planning Services: CM Consult Post Acute Care Choice: [place] Living arrangements for the past 2 months: [place] Expected Discharge Date: (unknown)                                    Prior Living Arrangements/Services Living arrangements for the past 2 months: Single Family Home Lives with:: Self          Need for Family Participation in Patient Care: Yes (Comment) Care giver support system in place?: No (comment)   Criminal Activity/Legal Involvement Pertinent to Current Situation/Hospitalization: No - Comment as needed  Activities of Daily Living Home Assistive Devices/Equipment: Eyeglasses, Cane (specify quad or straight), Walker (specify  type)(single point cane, front wheeled walker) ADL Screening (condition at time of admission) Patient's cognitive ability adequate to safely complete daily activities?: Yes Is the patient deaf or have difficulty hearing?: No Does the patient have difficulty seeing, even when wearing glasses/contacts?: No Does the patient have difficulty concentrating, remembering, or making decisions?: Yes Patient able to express need for assistance with ADLs?: Yes Does the patient have difficulty dressing or bathing?: No Independently performs ADLs?: Yes (appropriate for developmental age) Does the patient have difficulty walking or climbing stairs?: No Weakness of Legs: Both Weakness of Arms/Hands: None  Permission Sought/Granted                  Emotional Assessment       Orientation: : Oriented to Self      Admission diagnosis:  Confusion [R41.0] Patient Active Problem List   Diagnosis Date Noted  . Dysarthria 09/21/2019  . Confusion 09/21/2019  . Hypothyroidism 09/21/2019  . Drug reaction 09/21/2019  . Biliary sepsis   . Acute cholangitis 09/18/2019  . Hypokalemia 09/18/2019  . Hypomagnesemia 09/18/2019  . Pancreatic adenocarcinoma ([place]) 09/18/2019  . Biliary obstruction   . Pancreatic lesion   . Coronary artery disease 09/23/2018  . Unstable angina ([place]) 09/08/2018  . Paroxysmal atrial fibrillation ([place]) 08/30/2017  . Ascending aortic aneurysm ([place]) 08/30/2017  . Palpitations 03/29/2017  . Weakness 12/30/2010  . Fatigue 12/30/2010  . HYPERCHOLESTEROLEMIA 07/03/2008  . COLONIC POLYPS  05/15/2008  . Essential hypertension 05/15/2008  . GERD 05/15/2008  . DEGENERATIVE JOINT DISEASE 05/15/2008  . BENIGN PROSTATIC HYPERTROPHY, HX OF 05/15/2008   PCP:  Street, Sharon [place], MD Pharmacy:   [facility] [place] [facility] [place] [address] Phone: [phone] Fax: [phone]     Social Determinants of Health (SDOH) Interventions     Readmission Risk Interventions No flowsheet data found.

## 2019-09-26 NOTE — Evaluation (Signed)
Physical Therapy Evaluation Patient Details Name: Ronald Bird MRN: [id number] DOB: March 29, 1936 Today's Date: 09/26/2019   History of Present Illness  Pt is an 84 yo male admitted to [facility] with obstructed pancreatic cancer- newly diagnosed this year. Pt is s/p biliary stent 2/4- is scheduled for ERCP 09/27/19.  Pt presented with word finding and short term memory deficits.  His MRI was negative for acute findings.  A rash had developed on his hands and chest after augmentin intake.  Pt EEG c/w moderate diffuse encephalopathy.  Clinical Impression  Pt admitted with above diagnosis. Pt presents very confused with some pieces of accurate information and much other info added in. Min A to sit at EOB and then stand. Plan was to practice sit<>stand only due to lines but pt not following commands at that point and tried to ambulate with RW away from bed. Max tactile cueing to back to bed and return to sitting. Pt not lacking in strength but is lacking in coordination and cognitive function making it very unsafe for him to return home alone. He will need 24 hr supervision in his current cognitive condition.  Pt currently with functional limitations due to the deficits listed below (see PT Problem List). Pt will benefit from skilled PT to increase their independence and safety with mobility to allow discharge to the venue listed below.       Follow Up Recommendations SNF;Supervision/Assistance - 24 hour    Equipment Recommendations  None recommended by PT    Recommendations for Other Services       Precautions / Restrictions Precautions Precautions: Fall Restrictions Weight Bearing Restrictions: No      Mobility  Bed Mobility Overal bed mobility: Needs Assistance Bed Mobility: Sit to Supine;Supine to Sit     Supine to sit: Min assist Sit to supine: Mod assist   General bed mobility comments: tactile cues to initiate. Pt with sufficient power to come to EOB but some physical confusion with  task. At first sat with feet hovering over floor. Mod A for LE's back into bed, again not due to strength deficit but difficulty with sequencing and problem solving  Transfers Overall transfer level: Needs assistance Equipment used: Rolling walker (2 wheeled) Transfers: Sit to/from Stand Sit to Stand: Min assist         General transfer comment: min A to steady and for hand placement. Pt had difficulty sequencing task. Performed several times from bed  Ambulation/Gait Ambulation/Gait assistance: Min assist Gait Distance (Feet): 2 Feet Assistive device: Rolling walker (2 wheeled) Gait Pattern/deviations: Step-through pattern;Decreased stride length     General Gait Details: plan was not to ambulate due to pt being connected to IV pole and male purewick but pt began to ambulate despite vc's to stop. Needed physical redirection to back to bed and have a seat  Stairs            Wheelchair Mobility    Modified Rankin (Stroke Patients Only)       Balance Overall balance assessment: Needs assistance Sitting-balance support: No upper extremity supported;Feet supported Sitting balance-Leahy Scale: Good     Standing balance support: Bilateral upper extremity supported Standing balance-Leahy Scale: Fair                               Pertinent Vitals/Pain Pain Assessment: Faces Faces Pain Scale: No hurt    Home Living Family/patient expects to be discharged to:: Private residence Living  Arrangements: Alone               Additional Comments: per chart looks like pt is from home alone. Pt cannot give any accurate info due to AMS    Prior Function Level of Independence: Independent         Comments: seems that he was independent but not positive     Hand Dominance        Extremity/Trunk Assessment   Upper Extremity Assessment Upper Extremity Assessment: Overall WFL for tasks assessed    Lower Extremity Assessment Lower Extremity  Assessment: Overall WFL for tasks assessed    Cervical / Trunk Assessment Cervical / Trunk Assessment: Normal  Communication   Communication: Expressive difficulties(word finding difficulties)  Cognition Arousal/Alertness: Awake/alert Behavior During Therapy: (labile) Overall Cognitive Status: Impaired/Different from baseline Area of Impairment: Orientation;Attention;Memory;Following commands;Safety/judgement;Awareness;Problem solving                 Orientation Level: Disoriented to;Situation;Time;Place Current Attention Level: Focused Memory: Decreased short-term memory Following Commands: Follows one step commands inconsistently Safety/Judgement: Decreased awareness of deficits;Decreased awareness of safety Awareness: Intellectual Problem Solving: Difficulty sequencing;Requires verbal cues;Requires tactile cues General Comments: pt intermittently laughing and sometimes crying. Can tell me he's at [facility] but he doesn't know what type of place that is. Very concerned about getting his "reimbursement" and how much everything will cost. Does remember that he's having a procedure tomorrow but cannot give me any recent history from PTA.       General Comments      Exercises     Assessment/Plan    PT Assessment Patient needs continued PT services  PT Problem List Decreased balance;Decreased mobility;Decreased coordination;Decreased cognition;Decreased safety awareness;Decreased knowledge of precautions       PT Treatment Interventions DME instruction;Gait training;Functional mobility training;Therapeutic activities;Therapeutic exercise;Balance training;Patient/family education;Cognitive remediation    PT Goals (Current goals can be found in the Care Plan section)  Acute Rehab PT Goals Patient Stated Goal: get out of here PT Goal Formulation: Patient unable to participate in goal setting Time For Goal Achievement: 10/10/19 Potential to Achieve Goals: Fair     Frequency Min 2X/week   Barriers to discharge Decreased caregiver support      Co-evaluation               AM-PAC PT "6 Clicks" Mobility  Outcome Measure Help needed turning from your back to your side while in a flat bed without using bedrails?: A Little Help needed moving from lying on your back to sitting on the side of a flat bed without using bedrails?: A Little Help needed moving to and from a bed to a chair (including a wheelchair)?: A Little Help needed standing up from a chair using your arms (e.g., wheelchair or bedside chair)?: A Little Help needed to walk in hospital room?: A Lot Help needed climbing 3-5 steps with a railing? : A Lot 6 Click Score: 16    End of Session Equipment Utilized During Treatment: Gait belt Activity Tolerance: Treatment limited secondary to agitation Patient left: in bed;with bed alarm set;with call bell/phone within reach Nurse Communication: Mobility status PT Visit Diagnosis: Unsteadiness on feet (R26.81);Apraxia (R48.2)    Time: 1510-1536 PT Time Calculation (min) (ACUTE ONLY): 26 min   Charges:   PT Evaluation $PT Eval Moderate Complexity: 1 Mod PT Treatments $Therapeutic Activity: 8-22 mins        Ronald Bird, PT  Acute Rehab Services  Pager [phone] Office [place]  Ronald Bird 09/26/2019, 4:13 PM

## 2019-09-26 NOTE — H&P (View-Only) (Signed)
Daily Rounding Note  09/26/2019, 8:33 AM  LOS: 5 days   SUBJECTIVE:   Chief complaint: Obstructing pancreatic cancer.    Remains confused with expressive aphasia.  Less agitated but remains in soft restraints.  No abdominal pain.  Was made n.p.o. after an incident of observed dysphagia.  Speech pathology currently in the room and feels he can   OBJECTIVE:         Vital signs in last 24 hours:    Temp:  [97.5 F (36.4 C)-98.8 F (37.1 C)] 97.9 F (36.6 C) (02/16 0759) Pulse Rate:  [61-90] 86 (02/16 0759) Resp:  [16-20] 20 (02/16 0759) BP: (130-174)/(46-94) 152/87 (02/16 0759) SpO2:  [97 %-100 %] 97 % (02/16 0759) Last BM Date: 09/24/19 Filed Weights   09/22/19 2110 09/24/19 0843  Weight: 84.5 kg 79.8 kg   General: Alert.  Expressive aphasia.  Moves all 4 limbs. Heart: RRR Chest: No labored breathing, no cough.  Lungs clear bilaterally. Abdomen: Not tender or distended.  No masses, HSM, bruits, hernias.  Active bowel sounds. Extremities: No CCE. Neuro/Psych: Expressive aphasia, gets frustrated when he cannot find the words but frequently answers questions with non sequitur answers.  Does follow commands.  Intake/Output from previous day: 02/15 0701 - 02/16 0700 In: 1267 [I.V.:1267] Out: 1400 [Urine:1400]  Intake/Output this shift: No intake/output data recorded.  Lab Results: Recent Labs    09/24/19 0517 09/24/19 0843 09/26/19 0241  WBC 6.3 7.1 7.5  HGB 11.7* 12.5* 12.2*  HCT 33.6* 36.4* 34.3*  PLT 187 200 231   BMET Recent Labs    09/24/19 0843 09/25/19 1115 09/26/19 0241  NA 133* 135 134*  K 3.1* 3.9 3.4*  CL 102 103 105  CO2 21* 21* 20*  GLUCOSE 120* 112* 101*  BUN 17 16 12   CREATININE 0.90 0.75 0.86  CALCIUM 8.4* 8.6* 8.4*   LFT Recent Labs    09/24/19 0517 09/26/19 0241  PROT 6.3* 5.9*  ALBUMIN 3.3* 3.0*  AST 113* 103*  ALT 142* 148*  ALKPHOS 334* 315*  BILITOT 0.9 1.3*    PT/INR Recent Labs    09/24/19 0843  LABPROT 13.0  INR 1.0   Hepatitis Panel No results for input(s): HEPBSAG, HCVAB, HEPAIGM, HEPBIGM in the last 72 hours.  Studies/Results: EEG  Result Date: 09/25/2019 Lora Havens, MD     09/25/2019  1:48 PM Patient Name: Ronald Bird MRN: [id number] Epilepsy Attending: Lora Havens Referring Physician/Provider: Dr. Fayrene Helper Date: 09/25/2019 Duration: 25.05 minutes Patient history: 84 year old male with history of pancreatic cancer presented with altered mental status.  EEG to evaluate for seizures. Level of alertness: Awake AEDs during EEG study: Lorazepam Technical aspects: This EEG study was done with scalp electrodes positioned according to the 10-20 International system of electrode placement. Electrical activity was acquired at a sampling rate of 500Hz  and reviewed with a high frequency filter of 70Hz  and a low frequency filter of 1Hz . EEG data were recorded continuously and digitally stored. Description: During awake state, no clear posterior dominant was seen.  EEG showed continuous generalized 3 to 6 Hz theta-delta slowing admixed with an excessive amount of 15 to 18 Hz, 2-3 uV beta activity with irregular morphology distributed symmetrically and diffusely.  Physiologic photic driving was not seen during photic stimulation.  Hyperventilation was not performed. Abnormality -Continuous slow, generalized - Excessive beta, generalized IMPRESSION: This study is suggestive of moderate diffuse encephalopathy, nonspecific etiology. The excessive beta  activity seen in the background is most likely due to the effect of benzodiazepine and is a benign EEG pattern. No seizures or epileptiform discharges were seen throughout the recording. Lora Havens   DG Abd 1 View  Result Date: 09/24/2019 CLINICAL DATA:  Abdominal discomfort. EXAM: ABDOMEN - 1 VIEW COMPARISON:  None. FINDINGS: Large amount of stool in the LEFT colon, measuring  approximately 9 cm diameter. Mildly prominent small-bowel loops in the central abdomen. No evidence of free intraperitoneal air or abnormal fluid collection is seen. No acute appearing osseous abnormality within the visualized portion of the abdomen or pelvis. IMPRESSION: 1. Large amount of stool in the LEFT colon, measuring approximately 9 cm diameter. 2. Mildly prominent small-bowel loops in the central abdomen, raising the possibility of ileus or obstruction. Electronically Signed   By: Franki Cabot M.D.   On: 09/24/2019 15:03   MR BRAIN WO CONTRAST  Result Date: 09/24/2019 CLINICAL DATA:  Encephalopathy. EXAM: MRI HEAD WITHOUT CONTRAST TECHNIQUE: Multiplanar, multiecho pulse sequences of the brain and surrounding structures were obtained without intravenous contrast. COMPARISON:  Head CT 09/24/2019 and MRI 09/21/2019 FINDINGS: Brain: There is no evidence of acute infarct, intracranial hemorrhage, mass, midline shift, or extra-axial fluid collection. Cerebral atrophy is mild-to-moderate for age. Patchy and confluent T2 hyperintensities in the cerebral white matter bilaterally are unchanged from the recent prior MRI and are nonspecific but compatible with severe chronic small vessel ischemic disease. Vascular: Major intracranial vascular flow voids are preserved. Skull and upper cervical spine: No suspicious marrow lesion. C3-4 disc herniation with spinal stenosis, incompletely evaluated but similar to the recent prior MRI. Sinuses/Orbits: Bilateral cataract extraction. Paranasal sinuses and mastoid air cells are clear. Other: None. IMPRESSION: 1. No acute intracranial abnormality. 2. Severe chronic small vessel ischemic disease. Electronically Signed   By: Logan Bores M.D.   On: 09/24/2019 10:59   DG Abd Portable 2V  Result Date: 09/25/2019 CLINICAL DATA:  Constipation. EXAM: PORTABLE ABDOMEN - 2 VIEW COMPARISON:  09/24/2019 FINDINGS: Diffuse gaseous distention of small bowel is similar to prior. No  substantial stool volume evident. Biliary stent device again noted. Degenerative changes are evident in the lumbar spine. IMPRESSION: Prominent left colonic stool burden seen previously has resolved in the interval. Electronically Signed   By: Misty Stanley M.D.   On: 09/25/2019 14:47    ASSESMENT:   *    Pancreatic adenocarcinoma w malignant stricture.  ERCP, plastic temporary biliary stent placed 10/12/2019 Cholangitis, treated as inpatient 2/8 through 2/10. LFTs generally on improving, declining slope though T bili rising.  No fevers, normal WBCs.  *    Mental status changes.  MR brain 2/10, 2/14, CT head 2/14: Severe, chronic, small vessel ischemia. Neuro specialist attributing to behavioral health issues and has no issues with sedating patient for procedures.  *    Paroxysmal atrial fibrillation.  CAD.  Home Eliquis and Plavix on hold.  IV heparin in place.  Last Plavix, Eliquis > 1 week ago.   *    Hypokalemia.  K3.4.   PLAN   *     ERCP planned for 2/17 noon.  Will replace temporary, plastic biliary stent w permanent metal stent.  *   Stop IV heparin 5 hours before procedure.  *    Defer repletion/correction potassium to Dr. Tyrell Antonio.    *   Discussed the ERCP, risks, benefits with his daughter Stanton Kidney, phone [phone].  She understands and is agreeable to proceed.    Azucena Freed  09/26/2019, 8:33 AM Phone [phone]   ________________________________________________________________________  Velora Heckler GI MD note:  I personally examined the patient, reviewed the data and agree with the assessment and plan described above.  HE's actually a bit more clear mentally this morning.  I am planning on ERCP tomorrow to remove the previous plastic stent and place a permanent metal one.    Owens Loffler, MD [facility] Pager [phone]

## 2019-09-26 NOTE — Evaluation (Signed)
Clinical/Bedside Swallow Evaluation Patient Details  Name: Ronald Bird MRN: [id number] Date of Birth: 28-Oct-1935  Today's Date: 09/26/2019 Time: SLP Start Time (ACUTE ONLY): 0902 SLP Stop Time (ACUTE ONLY): 0925 SLP Time Calculation (min) (ACUTE ONLY): 23 min  Past Medical History:  Past Medical History:  Diagnosis Date  . BPH (benign prostatic hyperplasia)   . Cancer ([place])   . Coronary artery disease   . DJD (degenerative joint disease)   . GERD (gastroesophageal reflux disease)   . History of colonic polyps   . Hypercholesteremia   . Hypertension   . Pancreatic adenocarcinoma ([place]) 09/18/2019   Past Surgical History:  Past Surgical History:  Procedure Laterality Date  . BILIARY STENT PLACEMENT N/A 09/14/2019   Procedure: BILIARY STENT PLACEMENT;  Surgeon: Milus Banister, MD;  Location: [facility];  Service: Endoscopy;  Laterality: N/A;  . CATARACT EXTRACTION, BILATERAL  2012   In [facility]  . CORONARY ATHERECTOMY N/A 09/12/2018   Procedure: CORONARY ATHERECTOMY;  Surgeon: Jettie Booze, MD;  Location: [place] [facility];  Service: Cardiovascular;  Laterality: N/A;  mid LAD  . CORONARY BALLOON ANGIOPLASTY N/A 09/12/2018   Procedure: CORONARY BALLOON ANGIOPLASTY;  Surgeon: Jettie Booze, MD;  Location: [place] [facility];  Service: Cardiovascular;  Laterality: N/A;  diag 1  . CORONARY STENT INTERVENTION N/A 09/12/2018   Procedure: CORONARY STENT INTERVENTION;  Surgeon: Jettie Booze, MD;  Location: [place] [facility] LAB;  Service: Cardiovascular;  Laterality: N/A;  mid LAD  . ENDOSCOPIC RETROGRADE CHOLANGIOPANCREATOGRAPHY (ERCP) WITH PROPOFOL N/A 09/14/2019   Procedure: ENDOSCOPIC RETROGRADE CHOLANGIOPANCREATOGRAPHY (ERCP) WITH PROPOFOL;  Surgeon: Milus Banister, MD;  Location: [facility];  Service: Endoscopy;  Laterality: N/A;  . ESOPHAGOGASTRODUODENOSCOPY (EGD) WITH PROPOFOL N/A 09/14/2019   Procedure: ESOPHAGOGASTRODUODENOSCOPY (EGD) WITH PROPOFOL;   Surgeon: Milus Banister, MD;  Location: [facility];  Service: Endoscopy;  Laterality: N/A;  . EUS N/A 09/14/2019   Procedure: UPPER ENDOSCOPIC ULTRASOUND (EUS) RADIAL;  Surgeon: Milus Banister, MD;  Location: [facility];  Service: Endoscopy;  Laterality: N/A;  CAT 2  . FINE NEEDLE ASPIRATION N/A 09/14/2019   Procedure: FINE NEEDLE ASPIRATION (FNA) LINEAR;  Surgeon: Milus Banister, MD;  Location: [facility];  Service: Endoscopy;  Laterality: N/A;  . LEFT HEART CATH AND CORONARY ANGIOGRAPHY N/A 09/09/2018   Procedure: LEFT HEART CATH AND CORONARY ANGIOGRAPHY;  Surgeon: Burnell Blanks, MD;  Location: [place] [facility];  Service: Cardiovascular;  Laterality: N/A;  . LEFT HEART CATH AND CORONARY ANGIOGRAPHY N/A 09/12/2018   Procedure: LEFT HEART CATH AND CORONARY ANGIOGRAPHY;  Surgeon: Jettie Booze, MD;  Location: [place] [facility];  Service: Cardiovascular;  Laterality: N/A;  . MOHS SURGERY  2012   Skin Cancer removed from nose by Moh's in [place]  . SHOULDER SURGERY     HPI:  pt is an 84 yo male adm to [facility] with obstructed pancreatic cancer- newly diagnosed this year. Pt is s/p biliary stent 2/4- is scheduled for ERCP tomorrow.  Pt presented with word finding deficits and decreased short term memory problems.  His MRI was negative for acute findings.  A rash had developed on his hands and chest after augmentin intake.  Pt EEG c/w moderate diffuse encephalopathy.  Neuro questioned underlying neuropsych dx such as mania coupled with neurocognitive deficits.  Speech/swallow evaluation ordered. Daughter had reported to MD progressive memory deficits in the last few month.   Assessment / Plan / Recommendation Clinical Impression  Pt  presents with cognitive based dysphagia c/b decreased awareness to boluses - even when self feeding.  He is easily distracted and ceases masticating which will increase his aspiration with solids.  Pt took liquids to faciliate oral clearance of solids  resulting in presumed premature loss of liquid into airway before the swallow resulting in explosive coughing and spewing boluses.  Do not recommend he have solids requirng mastication at this time due to his cognitive based deficits. Will follow for readiness for dietary advancement.  Suspect liquid intake will be optimal for pt at this time. Recommend pt feed himself to imrpove his attention to task. SLP Visit Diagnosis: Dysphagia, oral phase (R13.11)(cognitive based)    Aspiration Risk  Moderate aspiration risk    Diet Recommendation Dysphagia 1 (Puree);Thin liquid   Liquid Administration via: Straw;Cup Medication Administration: (as tolerated - try with applesauc3e) Supervision: Patient able to self feed Compensations: Slow rate;Small sips/bites Postural Changes: Seated upright at 90 degrees;Remain upright for at least 30 minutes after po intake    Other  Recommendations Oral Care Recommendations: Oral care BID(dentures out and cleaned nightly)   Follow up Recommendations Skilled Nursing facility      Frequency and Duration min 2x/week  1 week       Prognosis Prognosis for Safe Diet Advancement: Fair Barriers to Reach Goals: Cognitive deficits      Swallow Study   General Date of Onset: 09/26/19 HPI: pt is an 84 yo male adm to [facility] with obstructed pancreatic cancer- newly diagnosed this year. Pt is s/p biliary stent 2/4- is scheduled for ERCP tomorrow.  Pt presented with word finding deficits and decreased short term memory problems.  His MRI was negative for acute findings.  A rash had developed on his hands and chest after augmentin intake.  Pt EEG c/w moderate diffuse encephalopathy.  Neuro questioned underlying neuropsych dx such as mania coupled with neurocognitive deficits.  Speech/swallow evaluation ordered. Daughter had reported to MD progressive memory deficits in the last few month. Type of Study: Bedside Swallow Evaluation Diet Prior to this Study: NPO Temperature  Spikes Noted: No Respiratory Status: Room air History of Recent Intubation: No Behavior/Cognition: Alert;Doesn't follow directions Oral Cavity Assessment: Dry Oral Care Completed by SLP: Yes(provided pt with toothbrush to brush is gums -) Oral Cavity - Dentition: Dentures, top;Dentures, bottom(SlP cleaned and pt placed in mouth) Self-Feeding Abilities: Able to feed self Patient Positioning: Upright in bed Baseline Vocal Quality: Normal Volitional Cough: Cognitively unable to elicit Volitional Swallow: Unable to elicit    Oral/Motor/Sensory Function Overall Oral Motor/Sensory Function: Within functional limits(pt decreased ability to follow directions)   Ice Chips Ice chips: Not tested   Thin Liquid Thin Liquid: Impaired Oral Phase Impairments: Poor awareness of bolus Oral Phase Functional Implications: Oral holding Other Comments: cognitive based deficits resulted in pt orally holding and inhaling juice with cracker bolus retained in oral cavity resulting in explosive coughing and juice/cracker spraying all over his face, episode occured x1 of approx 12 boluses    Nectar Thick Nectar Thick Liquid: Not tested   Honey Thick Honey Thick Liquid: Not tested   Puree Puree: Within functional limits Presentation: Spoon;Self Fed Oral Phase Impairments: Poor awareness of bolus   Solid     Solid: Impaired Presentation: Self Fed Oral Phase Impairments: Poor awareness of bolus Oral Phase Functional Implications: Oral residue      Macario Golds 09/26/2019,10:26 AM   Kathleen Lime, MS [place] Office [phone]

## 2019-09-26 NOTE — Progress Notes (Addendum)
[place] for Heparin Indication: atrial fibrillation  Allergies  Allergen Reactions  . Augmentin [Amoxicillin-Pot Clavulanate] Rash    Did it involve swelling of the face/tongue/throat, SOB, or low BP? No Did it involve sudden or severe rash/hives, skin peeling, or any reaction on the inside of your mouth or nose? No Did you need to seek medical attention at a hospital or doctor's office? Yes When did it last happen?Feb 2021 If all above answers are "NO", may proceed with cephalosporin use.    Patient Measurements: Height: 5\' 11"  (180.3 cm) Weight: 175 lb 14.8 oz (79.8 kg) IBW/kg (Calculated) : 75.3 Heparin Dosing Weight: n/a. TBW = 80 kg  Vital Signs: Temp: 98.4 F (36.9 C) (02/16 0033) Temp Source: Oral (02/16 0033) BP: 155/83 (02/16 0033) Pulse Rate: 80 (02/16 0033)  Labs: Recent Labs    09/24/19 0517 09/24/19 0517 09/24/19 0843 09/24/19 1509 09/24/19 1605 09/24/19 1814 09/25/19 1115 09/26/19 0241  HGB 11.7*   < > 12.5*  --   --   --   --  12.2*  HCT 33.6*  --  36.4*  --   --   --   --  34.3*  PLT 187  --  200  --   --   --   --  231  APTT  --   --  34  --   --   --   --   --   LABPROT  --   --  13.0  --   --   --   --   --   INR  --   --  1.0  --   --   --   --   --   HEPARINUNFRC  --   --   --   --  <0.10*  --  0.26* 0.31  CREATININE 0.90  --  0.90  --   --   --  0.75  --   CKTOTAL  --   --  39*  --   --   --   --   --   TROPONINIHS  --   --   --  8  --  8  --   --    < > = values in this interval not displayed.    Estimated Creatinine Clearance: 77.1 mL/min (by C-G formula based on SCr of 0.75 mg/dL).   Medical History: Past Medical History:  Diagnosis Date  . BPH (benign prostatic hyperplasia)   . Cancer ([place])   . Coronary artery disease   . DJD (degenerative joint disease)   . GERD (gastroesophageal reflux disease)   . History of colonic polyps   . Hypercholesteremia   . Hypertension   . Pancreatic  adenocarcinoma ([place]) 09/18/2019    Medications:  Apixaban 5 mg PO BID home medication for atrial fibrillation. LD 09/21/19.   Assessment: Pt is a 83yoM admitted on 2/11 with a biliary obstruction. PMH significant for pancreatic cancer s/p biliary stenting on 09/14/19. Pt discharged from hospital on 2/10, but returned on 2/11 with itching and speech difficulty. MRI on 2/11 showed no acute intracranial abnormality.  Apixaban has been on hold during admission for possible ERCP with stent exchange. Code stroke with teleneurology consult called on 2/14 AM. CT head today shows no evidence of hemorrhage, no acute intracranial abnormality. MRI also shows no acute intracranial abnormality. Pharmacy consulted to initiate heparin drip for atrial fibrillation, will dose using CVA protocol dosing at this time per  discussion with MD.  Significant Events: -2/11 MRI: No acute intracranial abnormality. CT: negative for acute intracranial abnormality -2/14 Code stroke. CT negative, MRI negative  2/16 AM update:  Heparin level therapeutic this AM  Goal of Therapy:  Heparin level 0.3-0.5 units/ml Monitor platelets by anticoagulation protocol: Yes   Plan:  Cont heparin at 1100 units/hr Follow up AM labs  Thank you Anette Guarneri, PharmD

## 2019-09-27 ENCOUNTER — Inpatient Hospital Stay (HOSPITAL_COMMUNITY): Payer: Medicare Other | Admitting: Anesthesiology

## 2019-09-27 ENCOUNTER — Other Ambulatory Visit: Payer: Self-pay

## 2019-09-27 ENCOUNTER — Telehealth: Payer: Self-pay | Admitting: Oncology

## 2019-09-27 ENCOUNTER — Encounter (HOSPITAL_COMMUNITY)
Admission: EM | Disposition: A | Discharge status: Home Health | Payer: Self-pay | Source: Home / Self Care | Attending: Family Medicine

## 2019-09-27 ENCOUNTER — Inpatient Hospital Stay (HOSPITAL_COMMUNITY): Payer: Medicare Other

## 2019-09-27 ENCOUNTER — Encounter (HOSPITAL_COMMUNITY): Payer: Self-pay | Admitting: Family Medicine

## 2019-09-27 DIAGNOSIS — C801 Malignant (primary) neoplasm, unspecified: Secondary | ICD-10-CM

## 2019-09-27 HISTORY — PX: BILIARY STENT PLACEMENT: SHX5538

## 2019-09-27 HISTORY — PX: ENDOSCOPIC RETROGRADE CHOLANGIOPANCREATOGRAPHY (ERCP) WITH PROPOFOL: SHX5810

## 2019-09-27 HISTORY — PX: ESOPHAGOGASTRODUODENOSCOPY (EGD) WITH PROPOFOL: SHX5813

## 2019-09-27 HISTORY — PX: STENT REMOVAL: SHX6421

## 2019-09-27 LAB — CBC
HCT: 34.4 % — ABNORMAL LOW (ref 39.0–52.0)
Hemoglobin: 12 g/dL — ABNORMAL LOW (ref 13.0–17.0)
MCH: 33.1 pg (ref 26.0–34.0)
MCHC: 34.9 g/dL (ref 30.0–36.0)
MCV: 95 fL (ref 80.0–100.0)
Platelets: 202 10*3/uL (ref 150–400)
RBC: 3.62 MIL/uL — ABNORMAL LOW (ref 4.22–5.81)
RDW: 13.1 % (ref 11.5–15.5)
WBC: 6.3 10*3/uL (ref 4.0–10.5)
nRBC: 0 % (ref 0.0–0.2)

## 2019-09-27 LAB — COMPREHENSIVE METABOLIC PANEL
ALT: 114 U/L — ABNORMAL HIGH (ref 0–44)
AST: 79 U/L — ABNORMAL HIGH (ref 15–41)
Albumin: 2.8 g/dL — ABNORMAL LOW (ref 3.5–5.0)
Alkaline Phosphatase: 263 U/L — ABNORMAL HIGH (ref 38–126)
Anion gap: 12 (ref 5–15)
BUN: 13 mg/dL (ref 8–23)
CO2: 21 mmol/L — ABNORMAL LOW (ref 22–32)
Calcium: 8.3 mg/dL — ABNORMAL LOW (ref 8.9–10.3)
Chloride: 102 mmol/L (ref 98–111)
Creatinine, Ser: 0.71 mg/dL (ref 0.61–1.24)
GFR calc Af Amer: >60 mL/min (ref >60)
GFR calc non Af Amer: >60 mL/min (ref >60)
Glucose, Bld: 99 mg/dL (ref 70–99)
Potassium: 3.4 mmol/L — ABNORMAL LOW (ref 3.5–5.1)
Sodium: 135 mmol/L (ref 135–145)
Total Bilirubin: 0.9 mg/dL (ref 0.3–1.2)
Total Protein: 5.9 g/dL — ABNORMAL LOW (ref 6.5–8.1)

## 2019-09-27 LAB — HEPARIN LEVEL (UNFRACTIONATED): Heparin Unfractionated: 0.3 IU/mL (ref 0.30–0.70)

## 2019-09-27 SURGERY — ENDOSCOPIC RETROGRADE CHOLANGIOPANCREATOGRAPHY (ERCP) WITH PROPOFOL
Anesthesia: General

## 2019-09-27 MED ORDER — ALBUMIN HUMAN 5 % IV SOLN: INTRAVENOUS | Status: DC | PRN

## 2019-09-27 MED ORDER — FENTANYL CITRATE (PF) 100 MCG/2ML IJ SOLN
INTRAMUSCULAR | Status: DC | PRN
  Administered 2019-09-27: 100 ug via INTRAVENOUS

## 2019-09-27 MED ORDER — SODIUM CHLORIDE 0.9 % IV SOLN: INTRAVENOUS | Status: DC

## 2019-09-27 MED ORDER — ROCURONIUM BROMIDE 10 MG/ML (PF) SYRINGE
PREFILLED_SYRINGE | INTRAVENOUS | Status: DC | PRN
  Administered 2019-09-27: 50 mg via INTRAVENOUS

## 2019-09-27 MED ORDER — INDOMETHACIN 50 MG RE SUPP
RECTAL | Status: AC
  Filled 2019-09-27: qty 2

## 2019-09-27 MED ORDER — PHENYLEPHRINE HCL-NACL 10-0.9 MG/250ML-% IV SOLN
INTRAVENOUS | Status: DC | PRN
  Administered 2019-09-27: 50 ug/min via INTRAVENOUS

## 2019-09-27 MED ORDER — METOPROLOL TARTRATE 5 MG/5ML IV SOLN
5.0000 mg | Freq: Once | INTRAVENOUS | Status: AC
  Administered 2019-09-27: 5 mg via INTRAVENOUS
  Filled 2019-09-27: qty 5

## 2019-09-27 MED ORDER — SODIUM CHLORIDE 0.9 % IV SOLN
INTRAVENOUS | Status: DC | PRN
  Administered 2019-09-27: 10 mL

## 2019-09-27 MED ORDER — PHENYLEPHRINE 40 MCG/ML (10ML) SYRINGE FOR IV PUSH (FOR BLOOD PRESSURE SUPPORT)
PREFILLED_SYRINGE | INTRAVENOUS | Status: DC | PRN
  Administered 2019-09-27 (×2): 80 ug via INTRAVENOUS

## 2019-09-27 MED ORDER — GLUCAGON HCL RDNA (DIAGNOSTIC) 1 MG IJ SOLR
INTRAMUSCULAR | Status: AC
  Filled 2019-09-27: qty 1

## 2019-09-27 MED ORDER — SUGAMMADEX SODIUM 200 MG/2ML IV SOLN
INTRAVENOUS | Status: DC | PRN
  Administered 2019-09-27: 200 mg via INTRAVENOUS

## 2019-09-27 MED ORDER — DEXAMETHASONE SODIUM PHOSPHATE 10 MG/ML IJ SOLN
INTRAMUSCULAR | Status: DC | PRN
  Administered 2019-09-27: 4 mg via INTRAVENOUS

## 2019-09-27 MED ORDER — CIPROFLOXACIN IN D5W 400 MG/200ML IV SOLN
INTRAVENOUS | Status: AC
  Filled 2019-09-27: qty 200

## 2019-09-27 MED ORDER — INDOMETHACIN 50 MG RE SUPP
RECTAL | Status: DC | PRN
  Administered 2019-09-27: 50 mg via RECTAL

## 2019-09-27 MED ORDER — POTASSIUM CHLORIDE CRYS ER 20 MEQ PO TBCR
20.0000 meq | EXTENDED_RELEASE_TABLET | Freq: Once | ORAL | Status: AC
  Administered 2019-09-27: 20 meq via ORAL
  Filled 2019-09-27: qty 1

## 2019-09-27 MED ORDER — LIDOCAINE 2% (20 MG/ML) 5 ML SYRINGE
INTRAMUSCULAR | Status: DC | PRN
  Administered 2019-09-27: 60 mg via INTRAVENOUS

## 2019-09-27 MED ORDER — LACTATED RINGERS IV SOLN: INTRAVENOUS | Status: DC | PRN

## 2019-09-27 MED ORDER — PROPOFOL 10 MG/ML IV BOLUS
INTRAVENOUS | Status: DC | PRN
  Administered 2019-09-27: 70 mg via INTRAVENOUS

## 2019-09-27 MED ORDER — INDOMETHACIN 50 MG RE SUPP
100.0000 mg | Freq: Once | RECTAL | Status: AC
  Administered 2019-09-27: 100 mg via RECTAL
  Filled 2019-09-27: qty 2

## 2019-09-27 NOTE — Anesthesia Procedure Notes (Signed)
Procedure Name: Intubation Date/Time: 09/27/2019 12:39 PM Performed by: Leonor Liv, CRNA Pre-anesthesia Checklist: Patient identified, Emergency Drugs available, Suction available and Patient being monitored Patient Re-evaluated:Patient Re-evaluated prior to induction Oxygen Delivery Method: Circle System Utilized Preoxygenation: Pre-oxygenation with 100% oxygen Induction Type: IV induction Ventilation: Mask ventilation without difficulty Laryngoscope Size: Mac and 4 Grade View: Grade I Tube type: Oral Tube size: 7.5 mm Number of attempts: 1 Airway Equipment and Method: Stylet and Oral airway Placement Confirmation: ETT inserted through vocal cords under direct vision,  positive ETCO2 and breath sounds checked- equal and bilateral Secured at: 22 cm Tube secured with: Tape Dental Injury: Teeth and Oropharynx as per pre-operative assessment

## 2019-09-27 NOTE — TOC Progression Note (Addendum)
Transition of Care [facility]) - Progression Note    Patient Details  Name: RAHSHAWN PRADO MRN: [id number] Date of Birth: [birth year]  Transition of [facility]) CM/SW Contact  Pollie Friar, RN Phone Number: 09/27/2019, 2:16 PM  Clinical Narrative:    CM spoke to patients daughter about her choice for SNF. She is asking that he be faxed out to the following facilities in order:  Veatrice Kells at [facility] #:  [phone] Fax: [place]: [place] [facility] [place] #:  [phone] Fax: [phone]  [facility] #:  [phone] Fax:    Bernestine Amass of [place] through the [facility] has faxed his information for review to #1 and #2. CM has left a message for #3.  Awaiting facilities to review to see if they can offer a bed.  TOC following.    Expected Discharge Plan: [place] Barriers to Discharge: Continued Medical Work up  Expected Discharge Plan and Services Expected Discharge Plan: [place] In-house Referral: Clinical Social Work Discharge Planning Services: CM Consult Post Acute Care Choice: [place] arrangements for the past 2 months: Single Family Home Expected Discharge Date: (unknown)                                     Social Determinants of Health (SDOH) Interventions    Readmission Risk Interventions No flowsheet data found.

## 2019-09-27 NOTE — Progress Notes (Signed)
Ronald Bird for Heparin Indication: atrial fibrillation  Allergies  Allergen Reactions  . Augmentin [Amoxicillin-Pot Clavulanate] Rash    Did it involve swelling of the face/tongue/throat, SOB, or low BP? No Did it involve sudden or severe rash/hives, skin peeling, or any reaction on the inside of your mouth or nose? No Did you need to seek medical attention at a hospital or doctor's office? Yes When did it last happen?Feb 2021 If all above answers are "NO", may proceed with cephalosporin use.    Patient Measurements: Height: 5\' 11"  (180.3 cm) Weight: 175 lb 14.8 oz (79.8 kg) IBW/kg (Calculated) : 75.3 Heparin Dosing Weight: n/a. TBW = 80 kg  Vital Signs: Temp: 98.9 F (37.2 C) (02/17 1330) Temp Source: Axillary (02/17 1330) BP: 159/80 (02/17 1350) Pulse Rate: 86 (02/17 1350)  Labs: Recent Labs    09/24/19 1509 09/24/19 1605 09/24/19 1814 09/25/19 1115 09/26/19 0241 09/27/19 0245 09/27/19 0957  HGB  --   --   --   --  12.2* 12.0*  --   HCT  --   --   --   --  34.3* 34.4*  --   PLT  --   --   --   --  231 202  --   HEPARINUNFRC  --    < >  --  0.26* 0.31 0.30  --   CREATININE  --   --   --  0.75 0.86  --  0.71  TROPONINIHS 8  --  8  --   --   --   --    < > = values in this interval not displayed.    Estimated Creatinine Clearance: 77.1 mL/min (by C-G formula based on SCr of 0.71 mg/dL).    Medications:  Apixaban 5 mg PO BID home medication for atrial fibrillation. LD 09/21/19.   Assessment: Pt is a 83yoM admitted on 2/11 with a biliary obstruction. PMH significant for pancreatic cancer s/p biliary stenting on 09/14/19. Pt discharged from hospital on 2/10, but returned on 2/11 with itching and speech difficulty. MRI on 2/11 showed no acute intracranial abnormality.  Apixaban has been on hold during admission for possible ERCP with stent exchange, Heparin held for ERCP procedure   Goal of Therapy:  Heparin level 0.3-0.5  units/ml Monitor platelets by anticoagulation protocol: Yes   Plan:  Heparin stopped Resume Eliquis in AM?    Thank you Anette Guarneri, PharmD

## 2019-09-27 NOTE — Transfer of Care (Signed)
Immediate Anesthesia Transfer of Care Note  Patient: Ronald Bird  Procedure(s) Performed: ENDOSCOPIC RETROGRADE CHOLANGIOPANCREATOGRAPHY (ERCP) WITH PROPOFOL (N/A ) STENT REMOVAL BILIARY STENT PLACEMENT ESOPHAGOGASTRODUODENOSCOPY (EGD) WITH PROPOFOL (N/A )  Patient Location: PACU  Anesthesia Type:General  Level of Consciousness: awake and alert   Airway & Oxygen Therapy: Patient Spontanous Breathing and Patient connected to nasal cannula oxygen  Post-op Assessment: Report given to RN and Post -op Vital signs reviewed and stable  Post vital signs: Reviewed and stable  Last Vitals:  Vitals Value Taken Time  BP 114/94 09/27/19 1430  Temp 37.5 C 09/27/19 1430  Pulse 157 09/27/19 1430  Resp 18 09/27/19 1430  SpO2 97 % 09/27/19 1430    Last Pain:  Vitals:   09/27/19 1430  TempSrc: Oral  PainSc:          Complications: No apparent anesthesia complications

## 2019-09-27 NOTE — Interval H&P Note (Signed)
History and Physical Interval Note:  09/27/2019 11:25 AM  Ronald Bird  has presented today for surgery, with the diagnosis of Pancreatic cancer.  Biliary obstruction.  Biliary stent in place..  The various methods of treatment have been discussed with the patient and family. After consideration of risks, benefits and other options for treatment, the patient has consented to  Procedure(s): ENDOSCOPIC RETROGRADE CHOLANGIOPANCREATOGRAPHY (ERCP) WITH PROPOFOL (N/A) as a surgical intervention.  The patient's history has been reviewed, patient examined, no change in status, stable for surgery.  I have reviewed the patient's chart and labs.  Questions were answered to the patient's satisfaction.     Milus Banister

## 2019-09-27 NOTE — Progress Notes (Signed)
Patients BP 171/65 after morning PO blood pressure meds administered. PRN hydralazine given per order/ ENDO personnel here to take patient for ERCP via stretcher. Consent signed by daughter Ronald Bird [facility]). Patient is DNR, verified by daughter but no DNR order in chart. MD notified for signed order.

## 2019-09-27 NOTE — Progress Notes (Signed)
NEUROLOGY PROGRESS NOTE  Subjective: Patient is very sleepy at this point.  No complaints.  Follow some commands  Exam: Vitals:   09/27/19 0415 09/27/19 0752  BP: (!) 173/66 (!) 182/80  Pulse: (!) 59 (!) 59  Resp: 20 20  Temp: 98.6 F (37 C) 98.7 F (37.1 C)  SpO2: 96% 97%    Physical Exam  Constitutional: Appears well-developed and well-nourished.  Eyes: No scleral injection HENT: No OP obstrucion Head: Normocephalic.  Cardiovascular: Normal rate and regular rhythm.  Respiratory: Effort normal, non-labored breathing GI: Soft.  No distension. There is no tenderness.  Skin: WDI   Neuro:  Mental Status: Alert, hypophonic but able to understand.  Follow some commands such as counting my fingers, showing me his thumb, holding arms up after some help Cranial Nerves: II:  Visual fields grossly normal,  III,IV, VI: ptosis not present, extra-ocular motions intact bilaterally pupils equal, round, reactive to light and accommodation V,VII: smile symmetric, facial light touch sensation normal bilaterally VIII: hearing normal bilaterally XII: midline tongue extension Motor: Moving all extremities antigravity especially to noxious stimuli Sensory: Pinprick and light touch intact throughout, bilaterally   Medications:  Scheduled: . Chlorhexidine Gluconate Cloth  6 each Topical Daily  . hydrochlorothiazide  12.5 mg Oral Daily  . levothyroxine  12.5 mcg Intravenous Daily  . lisinopril  20 mg Oral BID  . metoprolol tartrate  25 mg Oral BID  . pantoprazole  40 mg Oral QAC breakfast  . polyethylene glycol  17 g Oral BID  . QUEtiapine  25 mg Oral Q2000  . senna-docusate  2 tablet Oral QHS  . tamsulosin  0.4 mg Oral QHS   Continuous: . sodium chloride 75 mL/hr at 09/26/19 0226  . cefTRIAXone (ROCEPHIN)  IV 2 g (09/27/19 0559)    Pertinent Labs/Diagnostics: As of 09/26/2019 Sodium 134 Potassium 3.4 BUN 12 Creatinine 0.86 AST 103 ALT 148 Alk phos  315     Impression: -Delirium -Underlying cognitive decline  Recommendations: -Continue Seroquel as he is much calmer on this -Continue good sleep-wake schedule -Scheduled for ERCP today  Etta Quill PA-C [facility] [phone]   09/27/2019, 9:09 AM

## 2019-09-27 NOTE — Evaluation (Signed)
Occupational Therapy Evaluation Patient Details Name: Ronald Bird MRN: [id number] DOB: 09-Feb-1936 Today's Date: 09/27/2019    History of Present Illness Pt is an 84 yo male admitted to [facility] with obstructed pancreatic cancer- newly diagnosed this year. Pt is s/p biliary stent 2/4- is scheduled for ERCP 09/27/19.  Pt presented with word finding and short term memory deficits.  His MRI was negative for acute findings.  A rash had developed on his hands and chest after augmentin intake.  Pt EEG c/w moderate diffuse encephalopathy.   Clinical Impression   Pt admitted with above diagnoses, presenting with cognitive deficits that limit ability to safely engage in BADL. Unsure of PLOF due to pt AMS and not able to report at this time. When asked his orientation, pt states that he is "on the moon". He is also stating other unintelligible statements with difficulty engaging in meaningful tasks at this time. He was able to sit EOB with mod A. Once EOB, pt donning socks with max A and multimodal cueing to process and problem solve. Given his current status, recommend pt d/c to SNF for 24/7 care. Will continue to follow per POC listed below.    Follow Up Recommendations  SNF;Supervision/Assistance - 24 hour    Equipment Recommendations  3 in 1 bedside commode    Recommendations for Other Services       Precautions / Restrictions Precautions Precautions: Fall Restrictions Weight Bearing Restrictions: No      Mobility Bed Mobility Overal bed mobility: Needs Assistance Bed Mobility: Sit to Supine;Supine to Sit     Supine to sit: Mod assist Sit to supine: Mod assist   General bed mobility comments: multimodal cueing needed to initiate transfer. Assist to pull up at trunk to edge. Once back to bed, assist needed at BLEs to guid back into bed and for appropriate positioning. Limitations appeared to be due to processing rather than strength  Transfers Overall transfer level: Needs  assistance Equipment used: 1 person hand held assist Transfers: Sit to/from Stand Sit to Stand: Min assist         General transfer comment: min A to rise and steady, unsafe to continue standing due to current mental state. Continued session EOB with BADL engagement    Balance Overall balance assessment: Needs assistance Sitting-balance support: No upper extremity supported;Feet supported Sitting balance-Leahy Scale: Fair Sitting balance - Comments: difficulty with sitting balance challenge                                   ADL either performed or assessed with clinical judgement   ADL Overall ADL's : Needs assistance/impaired Eating/Feeding: NPO Eating/Feeding Details (indicate cue type and reason): for procedure Grooming: Set up;Cueing for safety;Cueing for sequencing   Upper Body Bathing: Set up;Cueing for safety;Cueing for sequencing   Lower Body Bathing: Maximal assistance;Sitting/lateral leans;Sit to/from stand;Cueing for safety;Cueing for sequencing   Upper Body Dressing : Set up;Cueing for safety;Cueing for sequencing   Lower Body Dressing: Maximal assistance;Sitting/lateral leans;Sit to/from stand;Cueing for safety;Cueing for sequencing Lower Body Dressing Details (indicate cue type and reason): to don socks, pt needing max A cues to attend to task and problem solve. As well as physical assist to cross legs and maintain this position as well as sitting balance Toilet Transfer: Moderate assistance;Cueing for sequencing;Cueing for safety   Toileting- Clothing Manipulation and Hygiene: Maximal assistance;Cueing for safety;Cueing for sequencing  General ADL Comments: pt largely limited by cognitive deficits and safe engagement in BADLs     Vision   Additional Comments: difficult to assess due to impaired cognition, but overal functional for assessment     Perception     Praxis      Pertinent Vitals/Pain Pain Assessment: Faces Faces Pain  Scale: Hurts little more Pain Location: neck and HA Pain Descriptors / Indicators: Aching Pain Intervention(s): Monitored during session;Repositioned     Hand Dominance     Extremity/Trunk Assessment Upper Extremity Assessment Upper Extremity Assessment: Difficult to assess due to impaired cognition   Lower Extremity Assessment Lower Extremity Assessment: Defer to PT evaluation       Communication Communication Communication: Expressive difficulties   Cognition Arousal/Alertness: Awake/alert Behavior During Therapy: Flat affect Overall Cognitive Status: Impaired/Different from baseline Area of Impairment: Orientation;Attention;Memory;Following commands;Safety/judgement;Awareness;Problem solving                 Orientation Level: Disoriented to;Situation;Time;Place Current Attention Level: Focused Memory: Decreased short-term memory Following Commands: Follows one step commands inconsistently Safety/Judgement: Decreased awareness of deficits;Decreased awareness of safety Awareness: Intellectual Problem Solving: Difficulty sequencing;Requires verbal cues;Requires tactile cues General Comments: pt very disoriented this date, stating he is "walking on the moon". At times making unintelligble statements and difficulty even with yes/no questions   General Comments       Exercises     Shoulder Instructions      Home Living Family/patient expects to be discharged to:: Unsure                                 Additional Comments: unsure of PLOF due to AMS and pt limited ability to report information at this time. Per chart looks as ifhe was home alone and mostly independent but not confirmed      Prior Functioning/Environment                   OT Problem List: Decreased strength;Decreased knowledge of use of DME or AE;Decreased coordination;Decreased knowledge of precautions;Decreased activity tolerance;Decreased cognition;Impaired balance (sitting  and/or standing);Decreased safety awareness      OT Treatment/Interventions: Self-care/ADL training;Therapeutic exercise;Patient/family education;Neuromuscular education;Balance training;Energy conservation;Therapeutic activities;DME and/or AE instruction;Cognitive remediation/compensation    OT Goals(Current goals can be found in the care plan section) Acute Rehab OT Goals OT Goal Formulation: Patient unable to participate in goal setting Time For Goal Achievement: 10/11/19 Potential to Achieve Goals: Good  OT Frequency: Min 2X/week   Barriers to D/C:            Co-evaluation              AM-PAC OT "6 Clicks" Daily Activity     Outcome Measure Help from another person eating meals?: Total(NPO for procedure) Help from another person taking care of personal grooming?: A Little Help from another person toileting, which includes using toliet, bedpan, or urinal?: A Lot Help from another person bathing (including washing, rinsing, drying)?: A Lot Help from another person to put on and taking off regular upper body clothing?: A Little Help from another person to put on and taking off regular lower body clothing?: A Lot 6 Click Score: 13   End of Session Nurse Communication: Mobility status  Activity Tolerance: Patient tolerated treatment well Patient left: in bed;with call bell/phone within reach;with bed alarm set  OT Visit Diagnosis: Unsteadiness on feet (R26.81);Other abnormalities of gait and mobility (R26.89);Other symptoms and signs  involving cognitive function                Time: [id number] OT Time Calculation (min): 17 min Charges:  OT General Charges $OT Visit: 1 Visit OT Evaluation $OT Eval Moderate Complexity: 1 Mod  Zenovia Jarred, MSOT, OTR/L Acute Rehabilitation Services [facility] Office Number: [phone]  Zenovia Jarred 09/27/2019, 12:38 PM

## 2019-09-27 NOTE — Anesthesia Postprocedure Evaluation (Signed)
Anesthesia Post Note  Patient: Ronald Bird  Procedure(s) Performed: ENDOSCOPIC RETROGRADE CHOLANGIOPANCREATOGRAPHY (ERCP) WITH PROPOFOL (N/A ) STENT REMOVAL BILIARY STENT PLACEMENT ESOPHAGOGASTRODUODENOSCOPY (EGD) WITH PROPOFOL (N/A )     Patient location during evaluation: Endoscopy Anesthesia Type: General Level of consciousness: confused Pain management: pain level controlled Vital Signs Assessment: post-procedure vital signs reviewed and stable Respiratory status: spontaneous breathing, nonlabored ventilation and respiratory function stable Cardiovascular status: blood pressure returned to baseline and stable Postop Assessment: no apparent nausea or vomiting Anesthetic complications: no    Last Vitals:  Vitals:   09/27/19 1340 09/27/19 1345  BP: (!) 166/78   Pulse: 87 85  Resp: 19 19  Temp:    SpO2: 99% 98%    Last Pain:  Vitals:   09/27/19 1330  TempSrc: Axillary  PainSc:                  Kimbria Camposano,W. EDMOND

## 2019-09-27 NOTE — Progress Notes (Signed)
Patient returned from ERCP via stretcher. Patient alert to self, talkative. Patient HR 140-160, AFIB. MD notified. Dose of IV metoprolol ordered and administered. BP 109/70. Will continue to monitor

## 2019-09-27 NOTE — NC FL2 (Addendum)
[place] LEVEL OF CARE SCREENING TOOL     IDENTIFICATION  Patient Name: Ronald Bird Birthdate: 1936-03-27 Sex: male Admission Date (Current Location): 09/21/2019  [facility] and [place] Number:  [place] and Address:         Provider Number: [id number]  Attending Physician Name and Address:  Harold Hedge, MD  Relative Name and Phone Number:       Current Level of Care: Hospital Recommended Level of Care: [place] Prior Approval Number:    Date Approved/Denied:   PASRR Number: [id number]  Discharge Plan: SNF    Current Diagnoses: Patient Active Problem List   Diagnosis Date Noted  . Dysarthria 09/21/2019  . Confusion 09/21/2019  . Hypothyroidism 09/21/2019  . Drug reaction 09/21/2019  . Biliary sepsis   . Acute cholangitis 09/18/2019  . Hypokalemia 09/18/2019  . Hypomagnesemia 09/18/2019  . Pancreatic adenocarcinoma ([place]) 09/18/2019  . Biliary obstruction   . Pancreatic lesion   . Coronary artery disease 09/23/2018  . Unstable angina ([place]) 09/08/2018  . Paroxysmal atrial fibrillation ([place]) 08/30/2017  . Ascending aortic aneurysm ([place]) 08/30/2017  . Palpitations 03/29/2017  . Weakness 12/30/2010  . Fatigue 12/30/2010  . HYPERCHOLESTEROLEMIA 07/03/2008  . COLONIC POLYPS 05/15/2008  . Essential hypertension 05/15/2008  . GERD 05/15/2008  . DEGENERATIVE JOINT DISEASE 05/15/2008  . BENIGN PROSTATIC HYPERTROPHY, HX OF 05/15/2008    Orientation RESPIRATION BLADDER Height & Weight     Self  Normal Incontinent Weight: 175 lb 14.8 oz (79.8 kg) Height:  5\' 11"  (180.3 cm)  BEHAVIORAL SYMPTOMS/MOOD NEUROLOGICAL BOWEL NUTRITION STATUS      Incontinent Diet(see discharge summery)  AMBULATORY STATUS COMMUNICATION OF NEEDS Skin   Total Care Verbally Normal                       Personal Care Assistance Level of Assistance  Dressing, Feeding, Bathing Bathing Assistance: Maximum assistance Feeding assistance:  Maximum assistance Dressing Assistance: Maximum assistance     Functional Limitations Info  Speech(expressive aphasia)     Speech Info: Impaired(expressive aphasia)    SPECIAL CARE FACTORS FREQUENCY  PT (By licensed PT), OT (By licensed OT), Speech therapy     PT Frequency: 5 times a week OT Frequency: 5 times a week     Speech Therapy Frequency: 5 times a week      Contractures Contractures Info: Not present    Additional Factors Info  Code Status, Allergies, Psychotropic Code Status Info: DNR Allergies Info: Augmentin Psychotropic Info: Seroquel 25mg          Current Medications (09/27/2019):  This is the current hospital active medication list Current Facility-Administered Medications  Medication Dose Route Frequency Provider Last Rate Last Admin  . 0.9 %  sodium chloride infusion   Intravenous Continuous Regalado, Belkys A, MD 75 mL/hr at 09/26/19 0226 New Bag at 09/26/19 0226  . acetaminophen (TYLENOL) tablet 500 mg  500 mg Oral Q6H PRN Elodia Florence., MD      . cefTRIAXone (ROCEPHIN) 2 g in sodium chloride 0.9 % 100 mL IVPB  2 g Intravenous Q24H Regalado, Belkys A, MD 200 mL/hr at 09/27/19 0559 2 g at 09/27/19 0559  . Chlorhexidine Gluconate Cloth 2 % PADS 6 each  6 each Topical Daily Elodia Florence., MD   6 each at 09/27/19 1007  . Glycerin (Adult) 2.1 g suppository 1 suppository  1 suppository Rectal Daily PRN Elodia Florence., MD      .  hydrALAZINE (APRESOLINE) injection 5 mg  5 mg Intravenous Q6H PRN Regalado, Belkys A, MD   5 mg at 09/27/19 1037  . hydrochlorothiazide (MICROZIDE) capsule 12.5 mg  12.5 mg Oral Daily Elodia Florence., MD   12.5 mg at 09/27/19 1006  . levothyroxine (SYNTHROID, LEVOTHROID) injection 12.5 mcg  12.5 mcg Intravenous Daily Kirby-Graham, Karsten Fells, NP   12.5 mcg at 09/26/19 0540  . lisinopril (ZESTRIL) tablet 20 mg  20 mg Oral BID Elodia Florence., MD   20 mg at 09/27/19 1006  . LORazepam (ATIVAN) injection  0.5 mg  0.5 mg Intravenous Q6H PRN Regalado, Belkys A, MD   0.5 mg at 09/27/19 0700  . metoprolol tartrate (LOPRESSOR) tablet 25 mg  25 mg Oral BID Elodia Florence., MD   25 mg at 09/27/19 1006  . nitroGLYCERIN (NITROSTAT) SL tablet 0.4 mg  0.4 mg Sublingual Q5 min PRN Elodia Florence., MD      . ondansetron [facility]) tablet 4 mg  4 mg Oral Q6H PRN Elodia Florence., MD      . pantoprazole (PROTONIX) EC tablet 40 mg  40 mg Oral QAC breakfast Elodia Florence., MD   40 mg at 09/26/19 1114  . polyethylene glycol (MIRALAX / GLYCOLAX) packet 17 g  17 g Oral BID Elodia Florence., MD   17 g at 09/26/19 1114  . QUEtiapine (SEROQUEL) tablet 25 mg  25 mg Oral Q2000 Greta Doom, MD   25 mg at 09/26/19 1959  . senna-docusate (Senokot-S) tablet 2 tablet  2 tablet Oral QHS Elodia Florence., MD   2 tablet at 09/26/19 2139  . tamsulosin (FLOMAX) capsule 0.4 mg  0.4 mg Oral QHS Elodia Florence., MD   0.4 mg at 09/26/19 2140     Discharge Medications: Please see discharge summary for a list of discharge medications.  Relevant Imaging Results:  Relevant Lab Results:   Additional Information SSN# [id number]  Emeterio Reeve, [place]

## 2019-09-27 NOTE — Op Note (Signed)
[facility] Patient Name: Ronald Bird Procedure Date : 09/27/2019 MRN: [id number] Attending MD: Milus Banister , MD Date of Birth: Oct 31, 1935 CSN: [id number] Age: 84 Admit Type: Inpatient Procedure:                ERCP Indications:              Malignant stricture of the common bile duct Providers:                Milus Banister, MD, Carlyn Reichert, RN, Marguerita Merles, Technician Referring MD:              Medicines:                General Anesthesia, Indomethacin 100 mg PR,                            cephtriaxone in hospital Complications:            No immediate complications. Estimated Blood Loss:     Estimated blood loss: none. Procedure:                Pre-Anesthesia Assessment:                           - Prior to the procedure, a History and Physical                            was performed, and patient medications and                            allergies were reviewed. The patient's tolerance of                            previous anesthesia was also reviewed. The risks                            and benefits of the procedure and the sedation                            options and risks were discussed with the patient.                            All questions were answered, and informed consent                            was obtained. Prior Anticoagulants: The patient has                            taken heparin, last dose was 1 day prior to                            procedure. ASA Grade Assessment: IV - A patient  with severe systemic disease that is a constant                            threat to life. After reviewing the risks and                            benefits, the patient was deemed in satisfactory                            condition to undergo the procedure.                           After obtaining informed consent, the scope was                            passed under direct vision.  Throughout the                            procedure, the patient's blood pressure, pulse, and                            oxygen saturations were monitored continuously. The                            [id number] Olympus duodenoscope was                            introduced through the mouth, and used to inject                            contrast into and used to inject contrast into the                            bile duct. The ERCP was accomplished without                            difficulty. The patient tolerated the procedure                            well. Scope In: Scope Out: Findings:      The duodenoscope (with new Olympus disposable tip) was advanced to the       region of the major papilla without detailed examination of the UGI       tract. The previously placed plastic biliary stent was extending about       2-72mm into the duodenum lumen. There was some tissue within the stent       lumen but no obvious biodebris. I used a snare to removed the plastic       stent and then cannulated the bile duct using a 44Autotome over a .035       hydrawire and injected dye. Contrast confirmed a 1cm long distal bile       duct stricture. The most distal 5-13mm of the bile duct was normal. The       CBD, CHD, right and left hepatic ducts  were all dilated. Niether the       cystic duct or gallbladder opacified. There was no purulence. I then       placed a 6cm long, 77mm diameter uncovered metal stent in good position       bridging the bile duct stricture. The most distal 1cm of the stent       extended into the duodenal lumen. The main pancreatic duct was never       injected with dye or cannulated with the wire.      Following ERCP I passed a standard adult gastroscope to the duodenal       bulb and there was no evidence of mucosal trauma in the visualized UGI       tract. Impression:               - The previously placed plastic biliary stent was                             removed and a 6cm long 57mm diameter UNcovered                            metal stent was placed across the known malignant                            distal bile duct stricture in good position. Recommendation:           - Follow LFTs and follow him clinically.                           - Given failure of the cystic duct and gallbladder                            to opacify I do have some concern about                            cholecystitis now. Should consider HIDA scan if his                            mental status fails to completely normalize in the                            next 12-24 hours.                           - I discussed the case with his daughter on the                            phone Procedure Code(s):        --- Professional ---                           [id number], Endoscopic retrograde                            cholangiopancreatography (ERCP); with removal and  exchange of stent(s), biliary or pancreatic duct,                            including pre- and post-dilation and guide wire                            passage, when performed, including sphincterotomy,                            when performed, each stent exchanged Diagnosis Code(s):        --- Professional ---                           K83.1, Obstruction of bile duct CPT copyright 2019 [employer]. All rights reserved. The codes documented in this report are preliminary and upon coder review may  be revised to meet current compliance requirements. Milus Banister, MD 09/27/2019 1:31:22 PM This report has been signed electronically. Number of Addenda: 0

## 2019-09-27 NOTE — Anesthesia Preprocedure Evaluation (Addendum)
Anesthesia Evaluation  Patient identified by MRN, date of birth, ID band Patient awake    Reviewed: Allergy & Precautions, H&P , NPO status , Patient's Chart, lab work & pertinent test results  Airway Mallampati: III  TM Distance: >3 FB Neck ROM: Full    Dental no notable dental hx. (+) Edentulous Upper, Edentulous Lower, Dental Advisory Given   Pulmonary neg pulmonary ROS,    Pulmonary exam normal breath sounds clear to auscultation       Cardiovascular hypertension, Pt. on medications and Pt. on home beta blockers + CAD and + Cardiac Stents  negative cardio ROS   Rhythm:Regular Rate:Normal     Neuro/Psych Dementia negative neurological ROS     GI/Hepatic Neg liver ROS, GERD  Medicated and Controlled,  Endo/Other  negative endocrine ROSHypothyroidism Pancreatic CA  Renal/GU negative Renal ROS  negative genitourinary   Musculoskeletal  (+) Arthritis , Osteoarthritis,    Abdominal   Peds  Hematology negative hematology ROS (+)   Anesthesia Other Findings   Reproductive/Obstetrics negative OB ROS                            Anesthesia Physical Anesthesia Plan  ASA: III  Anesthesia Plan: General   Post-op Pain Management:    Induction: Intravenous  PONV Risk Score and Plan: 2 and Ondansetron and Dexamethasone  Airway Management Planned: Oral ETT  Additional Equipment:   Intra-op Plan:   Post-operative Plan: Extubation in OR  Informed Consent: I have reviewed the patients History and Physical, chart, labs and discussed the procedure including the risks, benefits and alternatives for the proposed anesthesia with the patient or authorized representative who has indicated his/her understanding and acceptance.   Patient has DNR.  Discussed DNR with power of attorney and Continue DNR.   Dental advisory given  Plan Discussed with: CRNA  Anesthesia Plan Comments:         Anesthesia Quick Evaluation

## 2019-09-27 NOTE — Progress Notes (Signed)
Patient alert to self only. Resting in bed, bed alarm on. Room air. No complaints at this time.   IV is infusing IVF with no site complications. Condom catheter in place with urine output. Cath care to be completed. Patient dry at this time.   Call light in reach. Will continue to monitor.    1007: Patient CHG bath complete/peri care. Urine output documented. Bladder scan complete;112 ml scanned. Gown changed.

## 2019-09-27 NOTE — Telephone Encounter (Signed)
Per 2/17 sch msg. Left VM with appt date and time.  

## 2019-09-27 NOTE — Progress Notes (Signed)
PROGRESS NOTE    Ronald Bird    Code Status: DNR  KR:[id number] DOB: Feb 15, 1936 DOA: 09/21/2019 LOS: 6 days  PCP: Street, Sharon Mt, MD CC:  Chief Complaint  Patient presents with  . Altered Mental Status       Hospital Summary  84 year old with past medical history significant for pancreatic cancer with recent a stent placed status post biliary obstruction on 12//2021, hyperlipidemia, A. fib, hypertension, CAD, hypothyroidism, BPH who was discharged from hospital 09-20-2019.  Patient was admitted on 09/18/2019 for biliary sepsis acute cholangitis.  He was a started on oral Augmentin.  He had elevated liver function test that were trending down on discharge and his statins was held.  Per daughter patient was not feeling well the day prior to readmission, was off.  He developed a rash in his hands arms and chest after taking Augmentin.  He presented to the ED on 09-21-2019 with altered mental status, word finding difficulties.  Since his readmission GI was reconsulted and he was placed on ciprofloxacin for cholangitis with plan for a stent exchange on 12/15.  He had an MRI on presentation for his aphasia, which was negative for stroke. On 2/14, patient was worse again, becoming unresponsive at times with waxing and waning encephalopathy.  Neurology was consulted and plan was to transfer to [facility] for EEG and additional neurology evaluation.  Of note patient had an episode of bradycardia and hypotension and unresponsiveness that improved with trend [place] position.  This was thought to be related to vasovagal response.  2/17:  ERCP with stent replacement.  A. fib with RVR post ERCP with improvement after beta-blocker given.  A & P   Active Problems:   HYPERCHOLESTEROLEMIA   Essential hypertension   Paroxysmal atrial fibrillation (HCC)   Coronary artery disease   Biliary obstruction due to cancer [facility])   Pancreatic adenocarcinoma (HCC)   Dysarthria   Confusion    Hypothyroidism   Drug reaction   1-Acute metabolic encephalopathy, word finding difficulty, delirium Delirium, multifactorial; infectious process cholangitis, antibiotics (Cipro)  MRI negative for Stroke. EEG negative for seizure.  PRN IV ativan ordered.  B 12, TSH , Ammonia level normal.  Cultures with no growth to date AAOx3 today and talking about death but not with suicidal ideation or plan Continue with Seroquel  2-Vasovagal episode, hypotension bradycardia: Resolved  Troponin negative.   3-Abdominal pain secondary to constipation and acute urinary retention: KUB; fecal impaction resolved.  Urinating so far.  Continue with bowel regimen.   4-Cholangitis, Biliary Obstruction, Pancreatic Adenocarcinoma;  Ciprofloxacin change to ceftriaxone to avoid worsening delirium.  2/17 ERCP stent exchange GI concerned about cholecystitis, monitor over next 24 to 48 hours and consider HIDA scan Continue following with GI  5-Drug reaction Augmentin; resolved.   6-paroxysmal A fib; A. fib with RVR RVR post ERCP today rates 150-170, improved with beta-blocker Off Eliquis and was on heparin prior to procedure Restart heparin per GI recommendations  HTN; PRN hydralazine.  Continue with hydrochlorothiazide, lisinopril, metoprolol.  BPH; continue with Flomax.  Hypokalemia: Replete orally  Estimated body mass index is 24.54 kg/m as calculated from the following:   Height as of this encounter: 5\' 11"  (1.803 m).   Weight as of this encounter: 79.8 kg.  DVT prophylaxis: SCD, restart heparin drip per GI recommendations Family Communication: Patient's daughter has been updated by GI Disposition Plan:   Patient came from:   Home  Anticipated d/c place: SNF  Barriers to d/c: Medical stability  Pressure injury documentation    None  Consultants  GI Neurology  Procedures   EEG ERCP  Antibiotics   Anti-infectives (From admission, onward)   Start     Dose/Rate Route Frequency Ordered Stop   09/26/19 0600  cefTRIAXone (ROCEPHIN) 2 g in sodium chloride 0.9 % 100 mL IVPB     2 g 200 mL/hr over 30 Minutes Intravenous Every 24 hours 09/25/19 0832     09/21/19 1600  ciprofloxacin (CIPRO) IVPB 400 mg  Status:  Discontinued     400 mg 200 mL/hr over 60 Minutes Intravenous Every 12 hours 09/21/19 1547 09/25/19 0831   09/21/19 1415  ceFEPIme (MAXIPIME) 2 g in sodium chloride 0.9 % 100 mL IVPB  Status:  Discontinued     2 g 200 mL/hr over 30 Minutes Intravenous  Once 09/21/19 1400 09/21/19 1547   09/21/19 1415  metroNIDAZOLE (FLAGYL) IVPB 500 mg  Status:  Discontinued     500 mg 100 mL/hr over 60 Minutes Intravenous  Once 09/21/19 1400 09/21/19 1547        Subjective   Seen and examined at bedside no acute distress resting comfortably post ERCP.  Notified by nursing that patient had heart rates up to 170 post procedure..  I presented to bedside and patient is hemodynamically stable on room air.  He was given beta-blockade with IV Lopressor with improved heart rate.  Patient denies any chest pain or palpitations, nausea or vomiting or abdominal pain.  Discussing thoughts about death which were unable to be discerned or quantified.  Did not have any suicidal ideation.  Other complaints  Objective   Vitals:   09/27/19 1510 09/27/19 1625 09/27/19 1655 09/27/19 1725  BP: 108/74 (!) 126/93 121/87 118/81  Pulse: (!) 121 (!) 123 91 94  Resp:  18 17 18   Temp:  98.7 F (37.1 C)    TempSrc:  Oral    SpO2:  97% 98% 98%  Weight:      Height:        Intake/Output Summary (Last 24 hours) at 09/27/2019 1844 Last data filed at 09/27/2019 1556 Gross per 24 hour  Intake 120 ml  Output 1635 ml  Net -1515 ml   Filed Weights   09/22/19 2110 09/24/19 0843  Weight: 84.5 kg 79.8 kg    Examination:  Physical Exam Vitals and nursing note reviewed. Exam conducted with  a chaperone present.  Constitutional:      Appearance: Normal appearance.  HENT:     Head: Normocephalic and atraumatic.  Eyes:     Conjunctiva/sclera: Conjunctivae normal.  Cardiovascular:     Rate and Rhythm: Tachycardia present. Rhythm irregular.  Pulmonary:     Effort: Pulmonary effort is normal.     Breath sounds: Normal breath sounds.  Abdominal:     General: Abdomen is flat.     Palpations: Abdomen is soft.  Musculoskeletal:        General: No swelling or tenderness.  Skin:    Coloration: Skin is not jaundiced or pale.  Neurological:     Mental Status: He is alert. Mental status is at baseline.  Psychiatric:        Behavior: Behavior is not aggressive.        Thought Content: Thought content does not include homicidal or suicidal ideation.     Data Reviewed: I have personally reviewed following labs and imaging studies  CBC: Recent Labs  Lab 09/21/19  1305 09/21/19 1305 09/22/19 0424 09/22/19 0424 09/23/19 0500 09/24/19 0517 09/24/19 0843 09/26/19 0241 09/27/19 0245  WBC 6.2   < > 5.2   < > 5.7 6.3 7.1 7.5 6.3  NEUTROABS 4.5  --  3.4  --  3.7 3.9  --   --   --   HGB 12.6*   < > 11.1*   < > 11.1* 11.7* 12.5* 12.2* 12.0*  HCT 36.6*   < > 32.9*   < > 32.4* 33.6* 36.4* 34.3* 34.4*  MCV 94.1   < > 95.9   < > 95.0 94.6 94.5 92.0 95.0  PLT 152   < > 156   < > 157 187 200 231 202   < > = values in this interval not displayed.   Basic Metabolic Panel: Recent Labs  Lab 09/21/19 1330 09/22/19 0752 09/23/19 0500 09/23/19 0500 09/24/19 0517 09/24/19 0843 09/25/19 1115 09/26/19 0241 09/27/19 0957  NA  --    < > 134*   < > 135 133* 135 134* 135  K  --    < > 3.5   < > 3.1* 3.1* 3.9 3.4* 3.4*  CL  --    < > 104   < > 104 102 103 105 102  CO2  --    < > 22   < > 23 21* 21* 20* 21*  GLUCOSE  --    < > 118*   < > 129* 120* 112* 101* 99  BUN  --    < > 19   < > 18 17 16 12 13   CREATININE  --    < > 0.77   < > 0.90 0.90 0.75 0.86 0.71  CALCIUM  --    < > 8.5*   <  > 8.7* 8.4* 8.6* 8.4* 8.3*  MG 2.1  --  1.8  --  1.8  --   --   --   --   PHOS  --   --  3.5  --  3.2  --   --   --   --    < > = values in this interval not displayed.   GFR: Estimated Creatinine Clearance: 77.1 mL/min (by C-G formula based on SCr of 0.71 mg/dL). Liver Function Tests: Recent Labs  Lab 09/22/19 0752 09/23/19 0500 09/24/19 0517 09/26/19 0241 09/27/19 0957  AST 138* 106* 113* 103* 79*  ALT 159* 134* 142* 148* 114*  ALKPHOS 364* 352* 334* 315* 263*  BILITOT 1.4* 1.1 0.9 1.3* 0.9  PROT 5.6* 5.9* 6.3* 5.9* 5.9*  ALBUMIN 3.0* 3.2* 3.3* 3.0* 2.8*   Recent Labs  Lab 09/21/19 1305 09/24/19 1509  LIPASE 12 13   Recent Labs  Lab 09/24/19 0959  AMMONIA 30   Coagulation Profile: Recent Labs  Lab 09/21/19 1330 09/22/19 0424 09/24/19 0843  INR 1.1 1.1 1.0   Cardiac Enzymes: Recent Labs  Lab 09/24/19 0843  CKTOTAL 39*   BNP (last 3 results) No results for input(s): PROBNP in the last 8760 hours. HbA1C: No results for input(s): HGBA1C in the last 72 hours. CBG: Recent Labs  Lab 09/24/19 0729 09/24/19 1407 09/24/19 2011 09/25/19 0323  GLUCAP 108* 110* 135* 106*   Lipid Profile: No results for input(s): CHOL, HDL, LDLCALC, TRIG, CHOLHDL, LDLDIRECT in the last 72 hours. Thyroid Function Tests: Recent Labs    09/24/19 1940  TSH 3.460   Anemia Panel: Recent Labs    09/24/19 1940  VITAMINB12 1,424*  FOLATE  30.1   Sepsis Labs: Recent Labs  Lab 09/21/19 1305 09/24/19 1509 09/24/19 1814  LATICACIDVEN 0.9 1.1 0.9    Recent Results (from the past 240 hour(s))  Urine culture     Status: Abnormal   Collection Time: 09/18/19 11:50 AM   Specimen: Urine, Clean Catch  Result Value Ref Range Status   Specimen Description   Final    URINE, CLEAN CATCH Performed at [facility], [place] [address] [place], [place] [address]    Special Requests   Final    NONE Performed at [facility], [place] [address],  [place], [place] [address]    Culture MULTIPLE SPECIES PRESENT, SUGGEST RECOLLECTION (A)  Final   Report Status 09/19/2019 FINAL  Final  Blood culture (routine x 2)     Status: None   Collection Time: 09/18/19 12:01 PM   Specimen: BLOOD  Result Value Ref Range Status   Specimen Description   Final    BLOOD LEFT ANTECUBITAL Performed at [place] [address] [place], [place] [address]    Special Requests   Final    BOTTLES DRAWN AEROBIC AND ANAEROBIC Blood Culture adequate volume Performed at [place] [address] [place], [place] [address]    Culture   Final    NO GROWTH 5 DAYS Performed at [facility], [place] [address], [place],  [address]    Report Status 09/23/2019 FINAL  Final  Respiratory Panel by RT PCR (Flu A&B, Covid) - Nasopharyngeal Swab     Status: None   Collection Time: 09/18/19 12:54 PM   Specimen: Nasopharyngeal Swab  Result Value Ref Range Status   SARS Coronavirus 2 by RT PCR NEGATIVE NEGATIVE Final    Comment: (NOTE) SARS-CoV-2 target nucleic acids are NOT DETECTED. The SARS-CoV-2 RNA is generally detectable in upper respiratoy specimens during the acute phase of infection. The lowest concentration of SARS-CoV-2 viral copies this assay can detect is 131 copies/mL. A negative result does not preclude SARS-Cov-2 infection and should not be used as the sole basis for treatment or other patient management decisions. A negative result may occur with  improper specimen collection/handling, submission of specimen other than nasopharyngeal swab, presence of viral mutation(s) within the areas targeted by this assay, and inadequate number of viral copies (<131 copies/mL). A negative result must be combined with clinical observations, patient history, and epidemiological information. The expected result is Negative. Fact Sheet for Patients:  PinkCheek.be Fact Sheet for Healthcare  Providers:  GravelBags.it This test is not yet ap proved or cleared by the [place] FDA and  has been authorized for detection and/or diagnosis of SARS-CoV-2 by FDA under an Emergency Use Authorization (EUA). This EUA will remain  in effect (meaning this test can be used) for the duration of the COVID-19 declaration under Section 564(b)(1) of the Act, 21 U.S.C. section 360bbb-3(b)(1), unless the authorization is terminated or revoked sooner.    Influenza A by PCR NEGATIVE NEGATIVE Final   Influenza B by PCR NEGATIVE NEGATIVE Final    Comment: (NOTE) The Xpert Xpress SARS-CoV-2/FLU/RSV assay is intended as an aid in  the diagnosis of influenza from Nasopharyngeal swab specimens and  should not be used as a sole basis for treatment. Nasal washings and  aspirates are unacceptable for Xpert Xpress SARS-CoV-2/FLU/RSV  testing. Fact Sheet for Patients: PinkCheek.be Fact Sheet for Healthcare Providers: GravelBags.it This test is not yet approved or cleared by the [place] FDA  and  has been authorized for detection and/or diagnosis of SARS-CoV-2 by  FDA under an Emergency Use Authorization (EUA). This EUA will remain  in effect (meaning this test can be used) for the duration of the  Covid-19 declaration under Section 564(b)(1) of the Act, 21  U.S.C. section 360bbb-3(b)(1), unless the authorization is  terminated or revoked. Performed at [facility], [place] [address] [place], [place] [address]   Blood culture (routine x 2)     Status: None   Collection Time: 09/18/19  4:41 PM   Specimen: BLOOD  Result Value Ref Range Status   Specimen Description   Final    BLOOD RIGHT ANTECUBITAL Performed at [place] [address] [place], [place] [address]    Special Requests   Final    BOTTLES DRAWN AEROBIC AND ANAEROBIC Blood Culture adequate  volume Performed at [place] [address], [place], [place] [address]    Culture   Final    NO GROWTH 5 DAYS Performed at [facility], [place] [address], [place], [place] [address]    Report Status 09/23/2019 FINAL  Final  Urine culture     Status: None   Collection Time: 09/21/19  1:05 PM   Specimen: Urine, Clean Catch  Result Value Ref Range Status   Specimen Description   Final    URINE, CLEAN CATCH Performed at [facility], [place] [address] [place], [place] [address]    Special Requests   Final    NONE Performed at [facility], [place] [address] [place], [place] [address]    Culture   Final    NO GROWTH Performed at [facility], [place] [address], [place], [place] [address]    Report Status 09/23/2019 FINAL  Final  MRSA PCR Screening     Status: None   Collection Time: 09/24/19  8:19 AM   Specimen: Nasal Mucosa; Nasopharyngeal  Result Value Ref Range Status   MRSA by PCR NEGATIVE NEGATIVE Final    Comment:        The GeneXpert MRSA Assay (FDA approved for NASAL specimens only), is one component of a comprehensive MRSA colonization surveillance program. It is not intended to diagnose MRSA infection nor to guide or monitor treatment for MRSA infections. Performed at [facility], [place] [address], [place], [place] [address]   Culture, blood (routine x 2)     Status: None (Preliminary result)   Collection Time: 09/24/19  7:26 PM   Specimen: BLOOD  Result Value Ref Range Status   Specimen Description   Final    BLOOD LEFT ARM Performed at [place] [address] [place], [place] [address]    Special Requests   Final    BOTTLES DRAWN AEROBIC AND ANAEROBIC Blood Culture adequate volume Performed at [place] [address], [place], [place] [address]    Culture   Final    NO GROWTH 2 DAYS Performed at [place] [address] [place], [place] [address]    Report Status PENDING  Incomplete  Culture, blood (routine x 2)     Status: None (Preliminary result)   Collection Time: 09/24/19  7:26 PM   Specimen: BLOOD  Result Value Ref Range Status   Specimen Description   Final    BLOOD RIGHT ARM Performed at [place] [address], [place],  [address]  Special Requests   Final    BOTTLES DRAWN AEROBIC AND ANAEROBIC Blood Culture adequate volume Performed at [place] [address], [place], [place] [address]    Culture   Final    NO GROWTH 2 DAYS Performed at [place] [address] [place] [address], [place], [place] [address]    Report Status PENDING  Incomplete         Radiology Studies: DG ERCP  Result Date: 09/27/2019 CLINICAL DATA:  Biliary obstruction due to malignancy. EXAM: ERCP TECHNIQUE: Multiple spot images obtained with the fluoroscopic device and submitted for interpretation post-procedure. COMPARISON:  CT abdomen pelvis-09/14/2019 FLUOROSCOPY TIME:  44 seconds FINDINGS: 3 spot fluoroscopic intraoperative images of the right upper abdominal quadrant during ERCP are provided for review Initial image demonstrates an ERCP probe overlying the right upper abdominal quadrant. There is selective cannulation and opacification the common bile duct with short-segment narrowing/subtotal occlusion involving the distal aspect of the CBD with associated upstream moderate to marked dilatation of the common bile duct. Completion image demonstrates placement of an internal biliary stent overlying expected location the CBD. IMPRESSION: ERCP with biliary stent placement. These images were submitted for radiologic interpretation only. Please see the procedural report for the amount of contrast and the fluoroscopy time utilized. Electronically Signed   By: Sandi Mariscal M.D.   On: 09/27/2019 13:30        Scheduled Meds: . Chlorhexidine Gluconate Cloth  6  each Topical Daily  . hydrochlorothiazide  12.5 mg Oral Daily  . levothyroxine  12.5 mcg Intravenous Daily  . lisinopril  20 mg Oral BID  . metoprolol tartrate  25 mg Oral BID  . pantoprazole  40 mg Oral QAC breakfast  . polyethylene glycol  17 g Oral BID  . QUEtiapine  25 mg Oral Q2000  . senna-docusate  2 tablet Oral QHS  . tamsulosin  0.4 mg Oral QHS   Continuous Infusions: . sodium chloride 75 mL/hr at 09/27/19 1448  . cefTRIAXone (ROCEPHIN)  IV 2 g (09/27/19 0559)     Time spent: 25 minutes with over 50% of the time coordinating the patient's care    Harold Hedge, DO [facility] Pager [phone]  Call night coverage person covering after 7pm

## 2019-09-27 NOTE — Progress Notes (Signed)
Patient currently re-hospitalized for altered mental status, not a surgical candidate, Dr. Benay Spice (Med Onc) will see patient upon discharge from the hospital.

## 2019-09-28 ENCOUNTER — Telehealth: Payer: Self-pay | Admitting: Physician Assistant

## 2019-09-28 LAB — CBC
HCT: 31.7 % — ABNORMAL LOW (ref 39.0–52.0)
Hemoglobin: 11.1 g/dL — ABNORMAL LOW (ref 13.0–17.0)
MCH: 32.6 pg (ref 26.0–34.0)
MCHC: 35 g/dL (ref 30.0–36.0)
MCV: 93.2 fL (ref 80.0–100.0)
Platelets: 211 10*3/uL (ref 150–400)
RBC: 3.4 MIL/uL — ABNORMAL LOW (ref 4.22–5.81)
RDW: 13.1 % (ref 11.5–15.5)
WBC: 7.1 10*3/uL (ref 4.0–10.5)
nRBC: 0 % (ref 0.0–0.2)

## 2019-09-28 LAB — COMPREHENSIVE METABOLIC PANEL
ALT: 89 U/L — ABNORMAL HIGH (ref 0–44)
AST: 58 U/L — ABNORMAL HIGH (ref 15–41)
Albumin: 2.6 g/dL — ABNORMAL LOW (ref 3.5–5.0)
Alkaline Phosphatase: 211 U/L — ABNORMAL HIGH (ref 38–126)
Anion gap: 9 (ref 5–15)
BUN: 16 mg/dL (ref 8–23)
CO2: 20 mmol/L — ABNORMAL LOW (ref 22–32)
Calcium: 8.4 mg/dL — ABNORMAL LOW (ref 8.9–10.3)
Chloride: 102 mmol/L (ref 98–111)
Creatinine, Ser: 0.79 mg/dL (ref 0.61–1.24)
GFR calc Af Amer: >60 mL/min (ref >60)
GFR calc non Af Amer: >60 mL/min (ref >60)
Glucose, Bld: 148 mg/dL — ABNORMAL HIGH (ref 70–99)
Potassium: 3.8 mmol/L (ref 3.5–5.1)
Sodium: 131 mmol/L — ABNORMAL LOW (ref 135–145)
Total Bilirubin: 0.9 mg/dL (ref 0.3–1.2)
Total Protein: 5.3 g/dL — ABNORMAL LOW (ref 6.5–8.1)

## 2019-09-28 LAB — HEPARIN LEVEL (UNFRACTIONATED)
Heparin Unfractionated: <0.1 IU/mL — ABNORMAL LOW (ref 0.30–0.70)
Heparin Unfractionated: 0.21 IU/mL — ABNORMAL LOW (ref 0.30–0.70)

## 2019-09-28 LAB — SARS CORONAVIRUS 2 (TAT 6-24 HRS): SARS Coronavirus 2: NEGATIVE

## 2019-09-28 LAB — MAGNESIUM: Magnesium: 1.9 mg/dL (ref 1.7–2.4)

## 2019-09-28 MED ORDER — POLYVINYL ALCOHOL 1.4 % OP SOLN
1.0000 [drp] | OPHTHALMIC | Status: DC | PRN
  Administered 2019-09-28: 1 [drp] via OPHTHALMIC
  Filled 2019-09-28 (×2): qty 15

## 2019-09-28 MED ORDER — HYPROMELLOSE (GONIOSCOPIC) 2.5 % OP SOLN: 1.0000 [drp] | OPHTHALMIC | Status: DC | PRN

## 2019-09-28 MED ORDER — HEPARIN (PORCINE) 25000 UT/250ML-% IV SOLN
1300.0000 [IU]/h | INTRAVENOUS | Status: DC
  Administered 2019-09-28: 1150 [IU]/h via INTRAVENOUS
  Administered 2019-09-29: 1300 [IU]/h via INTRAVENOUS
  Filled 2019-09-28 (×2): qty 250

## 2019-09-28 NOTE — Progress Notes (Signed)
NEUROLOGY PROGRESS NOTE  Subjective: Patient at this point has no complaints.  Tells me he is on the third floor, month is February, year is 2021  Exam: Vitals:   09/28/19 0320 09/28/19 0811  BP: 128/65 120/63  Pulse: 69 (!) 50  Resp: 18 18  Temp: (!) 97.3 F (36.3 C) 98.1 F (36.7 C)  SpO2: 98% 98%      Physical Exam  Constitutional: Appears well-developed and well-nourished.  Psych: Very pleasant affect today Eyes: No scleral injection HENT: No OP obstrucion Head: Normocephalic.  Cardiovascular: Normal rate and regular rhythm.  Respiratory: Effort normal, non-labored breathing GI: Soft.  No distension. There is no tenderness.  Skin: WDI   Neuro:  Mental Status: Alert, oriented, thought content appropriate.  Speech fluent without evidence of aphasia.  Able to simple step commands without difficulty. Cranial Nerves: II:  Visual fields grossly normal,  III,IV, VI: ptosis not present, extra-ocular motions intact bilaterally pupils equal, round, reactive to light and accommodation V,VII: smile symmetric, facial light touch sensation normal bilaterally VIII: hearing normal bilaterally Motor: Moving all extremities antigravity Sensory: Pinprick and light touch intact throughout, bilaterally  Medications:  Scheduled: . Chlorhexidine Gluconate Cloth  6 each Topical Daily  . hydrochlorothiazide  12.5 mg Oral Daily  . levothyroxine  12.5 mcg Intravenous Daily  . lisinopril  20 mg Oral BID  . metoprolol tartrate  25 mg Oral BID  . pantoprazole  40 mg Oral QAC breakfast  . polyethylene glycol  17 g Oral BID  . QUEtiapine  25 mg Oral Q2000  . senna-docusate  2 tablet Oral QHS  . tamsulosin  0.4 mg Oral QHS   Continuous: . sodium chloride 75 mL/hr at 09/28/19 0228  . cefTRIAXone (ROCEPHIN)  IV 2 g (09/28/19 0531)   [id number], Glycerin (Adult), hydrALAZINE, LORazepam, nitroGLYCERIN, ondansetron **OR** [DISCONTINUED] ondansetron (ZOFRAN) IV  Pertinent  Labs/Diagnostics:   DG ERCP  Result Date: 09/27/2019 CLINICAL DATA:  Biliary obstruction due to malignancy. EXAM: ERCP TECHNIQUE: Multiple spot images obtained with the fluoroscopic device and submitted for interpretation post-procedure. COMPARISON:  CT abdomen pelvis-09/14/2019 FLUOROSCOPY TIME:  44 seconds FINDINGS: 3 spot fluoroscopic intraoperative images of the right upper abdominal quadrant during ERCP are provided for review Initial image demonstrates an ERCP probe overlying the right upper abdominal quadrant. There is selective cannulation and opacification the common bile duct with short-segment narrowing/subtotal occlusion involving the distal aspect of the CBD with associated upstream moderate to marked dilatation of the common bile duct. Completion image demonstrates placement of an internal biliary stent overlying expected location the CBD. IMPRESSION: ERCP with biliary stent placement. These images were submitted for radiologic interpretation only. Please see the procedural report for the amount of contrast and the fluoroscopy time utilized. Electronically Signed   By: Sandi Mariscal M.D.   On: 09/27/2019 13:30     Etta Quill PA-C [facility] [id number]    Impression: Patient who presented to the hospital for a ERCP due to biliary obstruction.  While in hospital patient became very confused and at times inappropriate.  Long discussion was had with family member.  Patient was finally put on Seroquel.  Over the next 2 days patient has become very pleasant with no complaints.  Most likely multifocal delirium with underlying cognitive decline   Recommendations: -Continue Seroquel at scheduled dose   -At this point, no further neurological recommendations. Neurology will S/O    09/28/2019, 9:30 AM

## 2019-09-28 NOTE — Progress Notes (Signed)
Daily Rounding Note  09/28/2019, 10:14 AM  LOS: 7 days   SUBJECTIVE:   Chief complaint: Obstructing pancreatic cancer.    Patient's mental status has significantly improved today.  Has not had any bowel movement yesterday or today.  Denies nausea.  Not liking the pured diet recommended and ordered by SLP.  OBJECTIVE:         Vital signs in last 24 hours:    Temp:  [97.3 F (36.3 C)-99.5 F (37.5 C)] 98.1 F (36.7 C) (02/18 0811) Pulse Rate:  [50-157] 50 (02/18 0811) Resp:  [15-22] 18 (02/18 0811) BP: (108-195)/(57-94) 120/63 (02/18 0811) SpO2:  [97 %-100 %] 98 % (02/18 0811) Last BM Date: 09/26/19 Filed Weights   09/22/19 2110 09/24/19 0843  Weight: 84.5 kg 79.8 kg   General: Alert, sharp, comfortable. Heart: RRR Chest: Clear bilaterally.  No cough or labored breathing. Abdomen: Soft.  Not tender or distended.  Active bowel sounds. Extremities: No CCE. Neuro/Psych: Fully oriented to time, place, person, situation.  Calm, funny.  Fluid speech.  Significantly improved compared with 24 and 48 hours previously  Intake/Output from previous day: 02/17 0701 - 02/18 0700 In: 3679.8 [P.O.:360; I.V.:3319.8] Out: 1060 [Urine:1060]  Intake/Output this shift: Total I/O In: 680.4 [P.O.:240; I.V.:440.4] Out: -   Lab Results: Recent Labs    09/26/19 0241 09/27/19 0245 09/28/19 0310  WBC 7.5 6.3 7.1  HGB 12.2* 12.0* 11.1*  HCT 34.3* 34.4* 31.7*  PLT 231 202 211   BMET Recent Labs    09/26/19 0241 09/27/19 0957 09/28/19 0310  NA 134* 135 131*  K 3.4* 3.4* 3.8  CL 105 102 102  CO2 20* 21* 20*  GLUCOSE 101* 99 148*  BUN 12 13 16   CREATININE 0.86 0.71 0.79  CALCIUM 8.4* 8.3* 8.4*   LFT Recent Labs    09/26/19 0241 09/27/19 0957 09/28/19 0310  PROT 5.9* 5.9* 5.3*  ALBUMIN 3.0* 2.8* 2.6*  AST 103* 79* 58*  ALT 148* 114* 89*  ALKPHOS 315* 263* 211*  BILITOT 1.3* 0.9 0.9   PT/INR No results for  input(s): LABPROT, INR in the last 72 hours. Hepatitis Panel No results for input(s): HEPBSAG, HCVAB, HEPAIGM, HEPBIGM in the last 72 hours.  Studies/Results: DG ERCP  Result Date: 09/27/2019 CLINICAL DATA:  Biliary obstruction due to malignancy. EXAM: ERCP TECHNIQUE: Multiple spot images obtained with the fluoroscopic device and submitted for interpretation post-procedure. COMPARISON:  CT abdomen pelvis-09/14/2019 FLUOROSCOPY TIME:  44 seconds FINDINGS: 3 spot fluoroscopic intraoperative images of the right upper abdominal quadrant during ERCP are provided for review Initial image demonstrates an ERCP probe overlying the right upper abdominal quadrant. There is selective cannulation and opacification the common bile duct with short-segment narrowing/subtotal occlusion involving the distal aspect of the CBD with associated upstream moderate to marked dilatation of the common bile duct. Completion image demonstrates placement of an internal biliary stent overlying expected location the CBD. IMPRESSION: ERCP with biliary stent placement. These images were submitted for radiologic interpretation only. Please see the procedural report for the amount of contrast and the fluoroscopy time utilized. Electronically Signed   By: Sandi Mariscal M.D.   On: 09/27/2019 13:30   Scheduled Meds: . Chlorhexidine Gluconate Cloth  6 each Topical Daily  . hydrochlorothiazide  12.5 mg Oral Daily  . levothyroxine  12.5 mcg Intravenous Daily  . lisinopril  20 mg Oral BID  . metoprolol tartrate  25 mg Oral BID  . pantoprazole  40 mg Oral QAC breakfast  . polyethylene glycol  17 g Oral BID  . QUEtiapine  25 mg Oral Q2000  . senna-docusate  2 tablet Oral QHS  . tamsulosin  0.4 mg Oral QHS   Continuous Infusions: . sodium chloride 75 mL/hr at 09/28/19 0228  . cefTRIAXone (ROCEPHIN)  IV 2 g (09/28/19 0531)  . heparin     PRN Meds:.acetaminophen, Glycerin (Adult), hydrALAZINE, LORazepam, nitroGLYCERIN, ondansetron **OR**  [DISCONTINUED] ondansetron (ZOFRAN) IV   ASSESMENT:   *   Pancreatic adenocarcinoma with malignant stricture. ERCP and plastic biliary stent placed 10/2019. Cholangitis, inpatient abx 2/8  -2/10, transition to outpatient abx.   Inpatient Rocephin day 3.  WBC is never elevated, no fever during current admission 07/26/2020 ERCP w removal of plastic biliary stent and placement of metal stent.  Nonopacification of cystic duct and gallbladder raising concern for cholecystitis.  Dr. Ardis Hughs suggested considering HIDA scan if patient's mental status fails to normalize within 12 to 24 hours. LFTs continue steady improvement.  *    Encephalopathy.  Seroquel initiated 2 days ago.  Mental status improved.  Neurology feels this is delirium superimposed on underlying cognitive decline.  *   Chronic Eliquis and Plavix.  Hx CAD and cardiac stenting in 09/2018, paroxysmal A. Fib.  *    Dysphagia.  Swallowing studies were performed in the setting of patient's delirium/encephalopathy.  SLP recommended dysphagia 1 diet.  With resolution of encephalopathy, I wonder if SLP could reevaluate and consider advancing to more advanced textures diet.   PLAN   *   ? Duration of  Rocephin/abx?  If these are to be continued, could we switch to oral antibiotics at this point?    *    Okay to resume heparin, Eliquis, Plavix.  *    Ask SLP to reevaluate for possible advancement of diet    Azucena Freed  09/28/2019, 10:14 AM Phone [phone]

## 2019-09-28 NOTE — Progress Notes (Signed)
Physical Therapy Treatment Patient Details Name: Ronald Bird MRN: [id number] DOB: 08/31/1935 Today's Date: 09/28/2019    History of Present Illness Pt is an 84 yo male admitted to [facility] with obstructed pancreatic cancer- newly diagnosed this year. Pt is s/p biliary stent 2/4- is scheduled for ERCP 09/27/19.  Pt presented with word finding and short term memory deficits.  His MRI was negative for acute findings.  A rash had developed on his hands and chest after augmentin intake.  Pt EEG c/w moderate diffuse encephalopathy.    PT Comments    Pt more lucid this session with no agitation.  He continues to require assistance for safety and SNF remains the best place post d/c.  Will continue to address balance and coordination deficits.     Follow Up Recommendations  SNF;Supervision/Assistance - 24 hour     Equipment Recommendations  None recommended by PT    Recommendations for Other Services       Precautions / Restrictions Precautions Precautions: Fall Restrictions Weight Bearing Restrictions: No    Mobility  Bed Mobility               General bed mobility comments: pt seated in recliner on arrival.  Transfers Overall transfer level: Needs assistance Equipment used: Rolling walker (2 wheeled) Transfers: Sit to/from Stand Sit to Stand: Min guard         General transfer comment: Cues for hand placementand forward weight shifting this session.  Ambulation/Gait Ambulation/Gait assistance: Min guard Gait Distance (Feet): 400 Feet Assistive device: Rolling walker (2 wheeled) Gait Pattern/deviations: Step-through pattern;Decreased stride length     General Gait Details: Cues for RW safety and upper trunk control.   Stairs             Wheelchair Mobility    Modified Rankin (Stroke Patients Only)       Balance Overall balance assessment: Needs assistance   Sitting balance-Leahy Scale: Normal       Standing balance-Leahy Scale: Fair                               Cognition Arousal/Alertness: Awake/alert Behavior During Therapy: Flat affect;WFL for tasks assessed/performed Overall Cognitive Status: Within Functional Limits for tasks assessed                                 General Comments: Pt very lucid this session and appropriate.  Cognition not formally assessed but alert and oriented today.      Exercises      General Comments        Pertinent Vitals/Pain Pain Assessment: No/denies pain Faces Pain Scale: Hurts little more Pain Location: neck and HA Pain Descriptors / Indicators: Aching Pain Intervention(s): Monitored during session;Repositioned    Home Living                      Prior Function            PT Goals (current goals can now be found in the care plan section) Acute Rehab PT Goals Patient Stated Goal: get out of here Potential to Achieve Goals: Fair Progress towards PT goals: Progressing toward goals    Frequency    Min 2X/week      PT Plan Current plan remains appropriate    Co-evaluation  AM-PAC PT "6 Clicks" Mobility   Outcome Measure  Help needed turning from your back to your side while in a flat bed without using bedrails?: A Little Help needed moving from lying on your back to sitting on the side of a flat bed without using bedrails?: A Little Help needed moving to and from a bed to a chair (including a wheelchair)?: A Little Help needed standing up from a chair using your arms (e.g., wheelchair or bedside chair)?: A Little Help needed to walk in hospital room?: A Little Help needed climbing 3-5 steps with a railing? : A Little 6 Click Score: 18    End of Session Equipment Utilized During Treatment: Gait belt Activity Tolerance: Patient tolerated treatment well Patient left: in chair;with call bell/phone within reach Nurse Communication: Mobility status PT Visit Diagnosis: Unsteadiness on feet (R26.81);Apraxia  (R48.2)     Time: [id number] PT Time Calculation (min) (ACUTE ONLY): 24 min  Charges:  $Gait Training: 8-22 mins $Therapeutic Activity: 8-22 mins                     Erasmo Leventhal , PTA Acute Rehabilitation Services Pager [phone] Office [phone]     Madia Carvell Eli Hose 09/28/2019, 4:47 PM

## 2019-09-28 NOTE — Progress Notes (Signed)
[place] for Heparin Indication: atrial fibrillation  Allergies  Allergen Reactions  . Augmentin [Amoxicillin-Pot Clavulanate] Rash    Did it involve swelling of the face/tongue/throat, SOB, or low BP? No Did it involve sudden or severe rash/hives, skin peeling, or any reaction on the inside of your mouth or nose? No Did you need to seek medical attention at a hospital or doctor's office? Yes When did it last happen?Feb 2021 If all above answers are "NO", may proceed with cephalosporin use.    Patient Measurements: Height: 5\' 11"  (180.3 cm) Weight: 175 lb 14.8 oz (79.8 kg) IBW/kg (Calculated) : 75.3 Heparin Dosing Weight: n/a. TBW = 80 kg  Vital Signs: Temp: 98.1 F (36.7 C) (02/18 0811) Temp Source: Oral (02/18 0811) BP: 120/63 (02/18 0811) Pulse Rate: 50 (02/18 0811)  Labs: Recent Labs    09/26/19 0241 09/26/19 0241 09/27/19 0245 09/27/19 0957 09/28/19 0310  HGB 12.2*   < > 12.0*  --  11.1*  HCT 34.3*  --  34.4*  --  31.7*  PLT 231  --  202  --  211  HEPARINUNFRC 0.31  --  0.30  --  <0.10*  CREATININE 0.86  --   --  0.71 0.79   < > = values in this interval not displayed.    Estimated Creatinine Clearance: 77.1 mL/min (by C-G formula based on SCr of 0.79 mg/dL).  Assessment: Pt is a 83yoM admitted on 2/11 with a biliary obstruction. PMH significant for pancreatic cancer s/p biliary stenting on 09/14/19. Pt discharged from hospital on 2/10, but returned on 2/11 with itching and speech difficulty. MRI on 2/11 showed no acute intracranial abnormality.  Apixaban has been on hold during admission now s/p ERCP for stent exhange  Significant Events: -2/11 MRI: No acute intracranial abnormality. CT: negative for acute intracranial abnormality -2/14 Code stroke. CT negative, MRI negative  Ok to resume AC per GI  CBC stable  Goal of Therapy:  Heparin level 0.3-0.5 units/ml Monitor platelets by anticoagulation protocol: Yes    Plan:  Heparin gtt 1150 units/hr Initial hep lvl 1800 Daily hep lvl cbc F/U plans to return to apixaban in a day or 2  Barth Kirks, PharmD, BCPS, BCCCP Clinical Pharmacist [phone]  Please check AMION for all [place] numbers  09/28/2019 9:37 AM

## 2019-09-28 NOTE — Progress Notes (Addendum)
  Speech Language Pathology Treatment:    Patient Details Name: Ronald Bird MRN: [id number] DOB: 1935-08-17 Today's Date: 09/28/2019 Time: 6986-1483 SLP Time Calculation (min) (ACUTE ONLY): 37 min  Assessment / Plan / Recommendation Clinical Impression  Pt with significant improvement in swallow and mentation compared to yesterday.  He is fully alert without dysarthria and able to focus attention on task at hand.  Pt able to feed himself a sandwich, pretzels, cookies, magic cup and tea.  Subtle throat clearing and cough x1 during entire meal - pt also is s/p ERCP yesterday - and he admits to minimal voice changes.    SLP provided pt with menu and he was able to determine food items he desired and requested SlP to call and order dinner.  Also pt wanted to call his daughter but he was still eating -and admitted he did not know his daughter's phone number *its in his cell phone. SLP looked up number and wrote it down for pt as well as his room phone given daughter's is long distance.  His problem solving skills adequate during session. Encephalopathy appears resolved.  No SLP follow up as pt appears at baseline. Pt agreeable.   Recommend advance diet to regular/thin given his rapid improvement in memory.       HPI: pt is an 84 yo male adm to [facility] with obstructed pancreatic cancer- newly diagnosed this year. Pt is s/p biliary stent 2/4- is scheduled for ERCP tomorrow.  Pt presented with word finding deficits and decreased short term memory problems.  His MRI was negative for acute findings.  A rash had developed on his hands and chest after augmentin intake.  Pt EEG c/w moderate diffuse encephalopathy.  Neuro questioned underlying neuropsych dx such as mania coupled with neurocognitive deficits.  Speech/swallow evaluation ordered. Daughter had reported to MD progressive memory deficits in the last few month.      SLP Plan  All goals met       Recommendations  Diet recommendations:  Regular;Thin liquid Liquids provided via: Straw;Cup Medication Administration: Whole meds with liquid(as tolerated - try with applesauc3e) Supervision: Patient able to self feed Compensations: Slow rate;Small sips/bites Postural Changes and/or Swallow Maneuvers: Seated upright 90 degrees;Upright 30-60 min after meal                Oral Care Recommendations: Oral care BID(dentures out and cleaned nightly) Follow up Recommendations: None SLP Visit Diagnosis: Dysphagia, oral phase (R13.11)(cognitive based) Plan: All goals met(re: swallowing)       GO            Kathleen Lime, MS [facility] SLP Acute Rehab Services Office [phone]  Macario Golds 09/28/2019, 2:05 PM

## 2019-09-28 NOTE — Progress Notes (Signed)
PROGRESS NOTE    Ronald Bird    Code Status: DNR  KR:[id number] DOB: 1936/02/27 DOA: 09/21/2019 LOS: 7 days  PCP: Street, Sharon [place], MD CC:  Chief Complaint  Patient presents with  . Altered Mental Status       Hospital Summary  84 year old with past medical history significant for pancreatic cancer with recent a stent placed status post biliary obstruction on 12//2021, hyperlipidemia, A. fib, hypertension, CAD, hypothyroidism, BPH who was discharged from hospital 09-20-2019.  Patient was admitted on 09/18/2019 for biliary sepsis acute cholangitis.  He was a started on oral Augmentin.  He had elevated liver function test that were trending down on discharge and his statins was held.  Per daughter patient was not feeling well the day prior to readmission, was off.  He developed a rash in his hands arms and chest after taking Augmentin.  He presented to the ED on 09-21-2019 with altered mental status, word finding difficulties.  Since his readmission GI was reconsulted and he was placed on ciprofloxacin for cholangitis with plan for a stent exchange on 12/15.  He had an MRI on presentation for his aphasia, which was negative for stroke. On 2/14, patient was worse again, becoming unresponsive at times with waxing and waning encephalopathy.  Neurology was consulted and plan was to transfer to [facility] for EEG and additional neurology evaluation.  Of note patient had an episode of bradycardia and hypotension and unresponsiveness that improved with trend [place] position.  This was thought to be related to vasovagal response.  2/17:  ERCP with stent replacement.  A. fib with RVR post ERCP with improvement after beta-blocker given.  A & P   Active Problems:   HYPERCHOLESTEROLEMIA   Essential hypertension   Paroxysmal atrial fibrillation (HCC)   Coronary artery disease   Biliary obstruction due to cancer [facility])   Pancreatic adenocarcinoma (HCC)   Dysarthria   Confusion    Hypothyroidism   Drug reaction   1-Acute metabolic encephalopathy, word finding difficulty, delirium Delirium, multifactorial; infectious process cholangitis, antibiotics (Cipro)  MRI negative for Stroke. EEG negative for seizure.  PRN IV ativan ordered.  B 12, TSH , Ammonia level normal.  Cultures with no growth to date AAOx3 today and not talking about death as he did yesterday, seems at/near baseline Neuro to sign off Continue with Seroquel  2-Vasovagal episode, hypotension bradycardia: Resolved  Troponin negative.   3-Abdominal pain secondary to constipation and acute urinary retention and likely from cholangitis KUB; fecal impaction resolved.  Urinating so far.  Continue with bowel regimen.   4-Cholangitis, Biliary Obstruction, Pancreatic Adenocarcinoma;  Ciprofloxacin change to ceftriaxone to avoid worsening delirium.  2/17 ERCP stent exchange Has done well overnight with improved LFTs, GI to sign off and recommended two more days PO antibiotics. Per GI, if clinical worsening, consider cholecystitis  5-Drug reaction Augmentin; resolved.   6-paroxysmal A fib; A. fib with RVR RVR post ERCP today rates 150-170, improved with beta-blocker Off Eliquis and was on heparin prior to procedure and currently on heparin Can restart eliquis at discharge  HTN; PRN hydralazine.  Continue with hydrochlorothiazide, lisinopril, metoprolol.  BPH; continue with Flomax.  Hypokalemia: Replete orally  Estimated body mass index is 24.54 kg/m as calculated from the following:   Height as of this encounter: 5\' 11"  (1.803 m).   Weight as of this encounter: 79.8 kg.  DVT prophylaxis: SCD, heparin Family Communication: Patient's daughter has been updated today by phone Disposition Plan:   Patient came  from:   Home                                                                                          Anticipated d/c place: SNF  Barriers to d/c: bed availability. Clinically  stable for discharge  Pressure injury documentation    None  Consultants  GI Neurology  Procedures  EEG ERCP  Antibiotics   Anti-infectives (From admission, onward)   Start     Dose/Rate Route Frequency Ordered Stop   09/26/19 0600  cefTRIAXone (ROCEPHIN) 2 g in sodium chloride 0.9 % 100 mL IVPB     2 g 200 mL/hr over 30 Minutes Intravenous Every 24 hours 09/25/19 0832     09/21/19 1600  ciprofloxacin (CIPRO) IVPB 400 mg  Status:  Discontinued     400 mg 200 mL/hr over 60 Minutes Intravenous Every 12 hours 09/21/19 1547 09/25/19 0831   09/21/19 1415  ceFEPIme (MAXIPIME) 2 g in sodium chloride 0.9 % 100 mL IVPB  Status:  Discontinued     2 g 200 mL/hr over 30 Minutes Intravenous  Once 09/21/19 1400 09/21/19 1547   09/21/19 1415  metroNIDAZOLE (FLAGYL) IVPB 500 mg  Status:  Discontinued     500 mg 100 mL/hr over 60 Minutes Intravenous  Once 09/21/19 1400 09/21/19 1547        Subjective   Today without any complaints and states he is doing well.   Objective   Vitals:   09/27/19 2320 09/28/19 0320 09/28/19 0811 09/28/19 1122  BP: 119/62 128/65 120/63 112/60  Pulse: 60 69 (!) 50 61  Resp: 18 18 18 18   Temp: 97.8 F (36.6 C) (!) 97.3 F (36.3 C) 98.1 F (36.7 C) 98.3 F (36.8 C)  TempSrc: Oral Oral Oral Oral  SpO2: 97% 98% 98% 98%  Weight:      Height:        Intake/Output Summary (Last 24 hours) at 09/28/2019 1335 Last data filed at 09/28/2019 1000 Gross per 24 hour  Intake 4360.16 ml  Output 500 ml  Net 3860.16 ml   Filed Weights   09/22/19 2110 09/24/19 0843  Weight: 84.5 kg 79.8 kg    Examination:  Physical Exam Vitals and nursing note reviewed.  Constitutional:      Appearance: Normal appearance.  HENT:     Head: Normocephalic and atraumatic.  Eyes:     Conjunctiva/sclera: Conjunctivae normal.  Cardiovascular:     Rate and Rhythm: Normal rate and regular rhythm.  Pulmonary:     Effort: Pulmonary effort is normal.     Breath sounds: Normal  breath sounds.  Abdominal:     General: Abdomen is flat.     Palpations: Abdomen is soft.  Musculoskeletal:        General: No swelling or tenderness.  Skin:    Coloration: Skin is not jaundiced or pale.  Neurological:     Mental Status: He is alert. Mental status is at baseline.  Psychiatric:        Mood and Affect: Mood normal.        Behavior: Behavior normal.     Data Reviewed: I  have personally reviewed following labs and imaging studies  CBC: Recent Labs  Lab 09/22/19 0424 09/22/19 0424 09/23/19 0500 09/23/19 0500 09/24/19 0517 09/24/19 0843 09/26/19 0241 09/27/19 0245 09/28/19 0310  WBC 5.2   < > 5.7   < > 6.3 7.1 7.5 6.3 7.1  NEUTROABS 3.4  --  3.7  --  3.9  --   --   --   --   HGB 11.1*   < > 11.1*   < > 11.7* 12.5* 12.2* 12.0* 11.1*  HCT 32.9*   < > 32.4*   < > 33.6* 36.4* 34.3* 34.4* 31.7*  MCV 95.9   < > 95.0   < > 94.6 94.5 92.0 95.0 93.2  PLT 156   < > 157   < > 187 200 231 202 211   < > = values in this interval not displayed.   Basic Metabolic Panel: Recent Labs  Lab 09/23/19 0500 09/23/19 0500 09/24/19 0517 09/24/19 0517 09/24/19 0843 09/25/19 1115 09/26/19 0241 09/27/19 0957 09/28/19 0310  NA 134*   < > 135   < > 133* 135 134* 135 131*  K 3.5   < > 3.1*   < > 3.1* 3.9 3.4* 3.4* 3.8  CL 104   < > 104   < > 102 103 105 102 102  CO2 22   < > 23   < > 21* 21* 20* 21* 20*  GLUCOSE 118*   < > 129*   < > 120* 112* 101* 99 148*  BUN 19   < > 18   < > 17 16 12 13 16   CREATININE 0.77   < > 0.90   < > 0.90 0.75 0.86 0.71 0.79  CALCIUM 8.5*   < > 8.7*   < > 8.4* 8.6* 8.4* 8.3* 8.4*  MG 1.8  --  1.8  --   --   --   --   --  1.9  PHOS 3.5  --  3.2  --   --   --   --   --   --    < > = values in this interval not displayed.   GFR: Estimated Creatinine Clearance: 77.1 mL/min (by C-G formula based on SCr of 0.79 mg/dL). Liver Function Tests: Recent Labs  Lab 09/23/19 0500 09/24/19 0517 09/26/19 0241 09/27/19 0957 09/28/19 0310  AST 106* 113*  103* 79* 58*  ALT 134* 142* 148* 114* 89*  ALKPHOS 352* 334* 315* 263* 211*  BILITOT 1.1 0.9 1.3* 0.9 0.9  PROT 5.9* 6.3* 5.9* 5.9* 5.3*  ALBUMIN 3.2* 3.3* 3.0* 2.8* 2.6*   Recent Labs  Lab 09/24/19 1509  LIPASE 13   Recent Labs  Lab 09/24/19 0959  AMMONIA 30   Coagulation Profile: Recent Labs  Lab 09/22/19 0424 09/24/19 0843  INR 1.1 1.0   Cardiac Enzymes: Recent Labs  Lab 09/24/19 0843  CKTOTAL 39*   BNP (last 3 results) No results for input(s): PROBNP in the last 8760 hours. HbA1C: No results for input(s): HGBA1C in the last 72 hours. CBG: Recent Labs  Lab 09/24/19 0729 09/24/19 1407 09/24/19 2011 09/25/19 0323  GLUCAP 108* 110* 135* 106*   Lipid Profile: No results for input(s): CHOL, HDL, LDLCALC, TRIG, CHOLHDL, LDLDIRECT in the last 72 hours. Thyroid Function Tests: No results for input(s): TSH, T4TOTAL, FREET4, T3FREE, THYROIDAB in the last 72 hours. Anemia Panel: No results for input(s): VITAMINB12, FOLATE, FERRITIN, TIBC, IRON, RETICCTPCT in the last 72 hours.  Sepsis Labs: Recent Labs  Lab 09/24/19 1509 09/24/19 1814  LATICACIDVEN 1.1 0.9    Recent Results (from the past 240 hour(s))  Blood culture (routine x 2)     Status: None   Collection Time: 09/18/19  4:41 PM   Specimen: BLOOD  Result Value Ref Range Status   Specimen Description   Final    BLOOD RIGHT ANTECUBITAL Performed at [facility], [place] [address], [place], [place] [address]    Special Requests   Final    BOTTLES DRAWN AEROBIC AND ANAEROBIC Blood Culture adequate volume Performed at [place] [address] [place], [place] [address]    Culture   Final    NO GROWTH 5 DAYS Performed at [facility], [place] [address], [place], [place] [address]    Report Status 09/23/2019 FINAL  Final  Urine culture     Status: None   Collection Time: 09/21/19  1:05 PM   Specimen: Urine, Clean Catch  Result Value Ref Range Status    Specimen Description   Final    URINE, CLEAN CATCH Performed at [facility], [place] [address], [place], [place] [address]    Special Requests   Final    NONE Performed at [facility], [place] [address], [place], [place] [address]    Culture   Final    NO GROWTH Performed at [facility], [place] [address], [place], [place] [address]    Report Status 09/23/2019 FINAL  Final  MRSA PCR Screening     Status: None   Collection Time: 09/24/19  8:19 AM   Specimen: Nasal Mucosa; Nasopharyngeal  Result Value Ref Range Status   MRSA by PCR NEGATIVE NEGATIVE Final    Comment:        The GeneXpert MRSA Assay (FDA approved for NASAL specimens only), is one component of a comprehensive MRSA colonization surveillance program. It is not intended to diagnose MRSA infection nor to guide or monitor treatment for MRSA infections. Performed at [facility], [place] [address], [place], [place] [address]   Culture, blood (routine x 2)     Status: None (Preliminary result)   Collection Time: 09/24/19  7:26 PM   Specimen: BLOOD  Result Value Ref Range Status   Specimen Description   Final    BLOOD LEFT ARM Performed at [place] [address] [place], [place] [address]    Special Requests   Final    BOTTLES DRAWN AEROBIC AND ANAEROBIC Blood Culture adequate volume Performed at [place] [address] [place], [place] [address]    Culture   Final    NO GROWTH 3 DAYS Performed at [facility], [place] [address], [place], [place] [address]    Report Status PENDING  Incomplete  Culture, blood (routine x 2)     Status: None (Preliminary result)   Collection Time: 09/24/19  7:26 PM   Specimen: BLOOD  Result Value Ref Range Status   Specimen Description   Final    BLOOD RIGHT ARM Performed at [place] [address] [place], [place] [address]    Special Requests    Final    BOTTLES DRAWN AEROBIC AND ANAEROBIC Blood Culture adequate volume Performed at [place] [address], [place],  [address]    Culture   Final    NO GROWTH 3 DAYS Performed at [facility], [place] [address] [place], [place] [address]    Report Status PENDING  Incomplete         Radiology Studies: DG ERCP  Result Date: 09/27/2019 CLINICAL DATA:  Biliary obstruction due to malignancy. EXAM: ERCP TECHNIQUE: Multiple spot images obtained with the fluoroscopic device and submitted for interpretation post-procedure. COMPARISON:  CT abdomen pelvis-09/14/2019 FLUOROSCOPY TIME:  44 seconds FINDINGS: 3 spot fluoroscopic intraoperative images of the right upper abdominal quadrant during ERCP are provided for review Initial image demonstrates an ERCP probe overlying the right upper abdominal quadrant. There is selective cannulation and opacification the common bile duct with short-segment narrowing/subtotal occlusion involving the distal aspect of the CBD with associated upstream moderate to marked dilatation of the common bile duct. Completion image demonstrates placement of an internal biliary stent overlying expected location the CBD. IMPRESSION: ERCP with biliary stent placement. These images were submitted for radiologic interpretation only. Please see the procedural report for the amount of contrast and the fluoroscopy time utilized. Electronically Signed   By: Sandi Mariscal M.D.   On: 09/27/2019 13:30        Scheduled Meds: . Chlorhexidine Gluconate Cloth  6 each Topical Daily  . hydrochlorothiazide  12.5 mg Oral Daily  . levothyroxine  12.5 mcg Intravenous Daily  . lisinopril  20 mg Oral BID  . metoprolol tartrate  25 mg Oral BID  . pantoprazole  40 mg Oral QAC breakfast  . polyethylene glycol  17 g Oral BID  . QUEtiapine  25 mg Oral Q2000  . senna-docusate  2 tablet Oral QHS  . tamsulosin  0.4 mg Oral QHS   Continuous Infusions: . sodium  chloride 75 mL/hr at 09/28/19 0228  . cefTRIAXone (ROCEPHIN)  IV 2 g (09/28/19 0531)  . heparin 1,150 Units/hr (09/28/19 1032)     Time spent: 20 minutes with over 50% of the time coordinating the patient's care    Harold Hedge, DO [facility] Pager [phone]  Call night coverage person covering after 7pm

## 2019-09-28 NOTE — Progress Notes (Signed)
[place] for Heparin Indication: atrial fibrillation  Allergies  Allergen Reactions  . Augmentin [Amoxicillin-Pot Clavulanate] Rash    Did it involve swelling of the face/tongue/throat, SOB, or low BP? No Did it involve sudden or severe rash/hives, skin peeling, or any reaction on the inside of your mouth or nose? No Did you need to seek medical attention at a hospital or doctor's office? Yes When did it last happen?Feb 2021 If all above answers are "NO", may proceed with cephalosporin use.    Patient Measurements: Height: 5\' 11"  (180.3 cm) Weight: 175 lb 14.8 oz (79.8 kg) IBW/kg (Calculated) : 75.3 Heparin Dosing Weight: n/a. TBW = 80 kg  Vital Signs: Temp: 97.9 F (36.6 C) (02/18 1653) Temp Source: Oral (02/18 1653) BP: 160/81 (02/18 1653) Pulse Rate: 61 (02/18 1653)  Labs: Recent Labs    09/26/19 0241 09/26/19 0241 09/27/19 0245 09/27/19 0957 09/28/19 0310 09/28/19 1805  HGB 12.2*   < > 12.0*  --  11.1*  --   HCT 34.3*  --  34.4*  --  31.7*  --   PLT 231  --  202  --  211  --   HEPARINUNFRC 0.31   < > 0.30  --  <0.10* 0.21*  CREATININE 0.86  --   --  0.71 0.79  --    < > = values in this interval not displayed.    Estimated Creatinine Clearance: 77.1 mL/min (by C-G formula based on SCr of 0.79 mg/dL).  Assessment: Pt is a 84 year old male admitted on 2/11 with a biliary obstruction. PMH significant for pancreatic cancer s/p biliary stenting on 09/14/19. Pt discharged from hospital on 2/10, but returned on 2/11 with itching and speech difficulty. MRI on 2/11 showed no acute intracranial abnormality.  Apixaban has been on hold during admission now s/p ERCP for stent exhange  Significant Events: -2/11 MRI: No acute intracranial abnormality. CT: negative for acute intracranial abnormality -2/14 Code stroke. CT negative, MRI negative  -GI gave ok for resuming anticoagulation on 2/18.   Initial heparin level is low at 0.21  on 1150 units/hr.  CBC has been stable.  No bleeding or issues with the infusion per [facility].   Goal of Therapy:  Heparin level 0.3-0.5 units/ml Monitor platelets by anticoagulation protocol: Yes   Plan:  Increase Heparin drip to 1300 units/hr (increase of ~2 units/kg/hour). No bolus.  Heparin level in 8 hours (ok to do with daily labs). Daily hep lvl cbc F/U plans to return to apixaban in a day or 2  Maricela Bo, PharmD, BCPS, BCCCP Clinical Pharmacist Please refer to [facility] for [place] numbers 09/28/2019 6:49 PM

## 2019-09-28 NOTE — Telephone Encounter (Signed)
mistake

## 2019-09-28 NOTE — TOC Progression Note (Signed)
Transition of Care [facility]) - Progression Note    Patient Details  Name: Ronald Bird MRN: [id number] Date of Birth: Feb 27, 1936  [facility] CM/SW [place], [place] Phone Number: 09/28/2019, 4:59 PM  Clinical Narrative:   CSW spoke with patient's daughter earlier today to confirm that she would like to choose [facility] for SNF. CSW confirmed bed availability with Lucita Ferrara in admissions, plan for admission tomorrow pending negative COVID test. CSW also coordinated for transportation for the patient, as [facility] is outside of the [facility] transport window. Patient's daughter has paid for transport and transportation can be scheduled when patient is discharged.   CSW to follow.    Expected Discharge Plan: [place] Barriers to Discharge: Continued Medical Work up  Expected Discharge Plan and Services Expected Discharge Plan: [place] In-house Referral: Clinical Social Work Discharge Planning Services: CM Consult Post Acute Care Choice: [place] arrangements for the past 2 months: Single Family Home Expected Discharge Date: (unknown)                                     Social Determinants of Health (SDOH) Interventions    Readmission Risk Interventions No flowsheet data found.

## 2019-09-29 ENCOUNTER — Inpatient Hospital Stay: Payer: Medicare Other | Admitting: Oncology

## 2019-09-29 LAB — COMPREHENSIVE METABOLIC PANEL
ALT: 88 U/L — ABNORMAL HIGH (ref 0–44)
AST: 70 U/L — ABNORMAL HIGH (ref 15–41)
Albumin: 2.6 g/dL — ABNORMAL LOW (ref 3.5–5.0)
Alkaline Phosphatase: 194 U/L — ABNORMAL HIGH (ref 38–126)
Anion gap: 9 (ref 5–15)
BUN: 13 mg/dL (ref 8–23)
CO2: 23 mmol/L (ref 22–32)
Calcium: 8.2 mg/dL — ABNORMAL LOW (ref 8.9–10.3)
Chloride: 106 mmol/L (ref 98–111)
Creatinine, Ser: 1.01 mg/dL (ref 0.61–1.24)
GFR calc Af Amer: >60 mL/min (ref >60)
GFR calc non Af Amer: >60 mL/min (ref >60)
Glucose, Bld: 96 mg/dL (ref 70–99)
Potassium: 3.7 mmol/L (ref 3.5–5.1)
Sodium: 138 mmol/L (ref 135–145)
Total Bilirubin: 0.6 mg/dL (ref 0.3–1.2)
Total Protein: 5.1 g/dL — ABNORMAL LOW (ref 6.5–8.1)

## 2019-09-29 LAB — CBC
HCT: 30.5 % — ABNORMAL LOW (ref 39.0–52.0)
Hemoglobin: 10.6 g/dL — ABNORMAL LOW (ref 13.0–17.0)
MCH: 32.6 pg (ref 26.0–34.0)
MCHC: 34.8 g/dL (ref 30.0–36.0)
MCV: 93.8 fL (ref 80.0–100.0)
Platelets: 220 10*3/uL (ref 150–400)
RBC: 3.25 MIL/uL — ABNORMAL LOW (ref 4.22–5.81)
RDW: 13.2 % (ref 11.5–15.5)
WBC: 6.9 10*3/uL (ref 4.0–10.5)
nRBC: 0 % (ref 0.0–0.2)

## 2019-09-29 LAB — HEPARIN LEVEL (UNFRACTIONATED): Heparin Unfractionated: 0.34 IU/mL (ref 0.30–0.70)

## 2019-09-29 MED ORDER — APIXABAN 5 MG PO TABS: 5.0000 mg | ORAL_TABLET | Freq: Two times a day (BID) | ORAL | Status: DC

## 2019-09-29 MED ORDER — LEVOTHYROXINE SODIUM 25 MCG PO TABS: 25.0000 ug | ORAL_TABLET | Freq: Every day | ORAL | Status: DC

## 2019-09-29 MED ORDER — QUETIAPINE FUMARATE 25 MG PO TABS: 25.0000 mg | ORAL_TABLET | Freq: Every day | ORAL | 0 refills | Status: AC

## 2019-09-29 MED ORDER — LISINOPRIL 20 MG PO TABS: 20.0000 mg | ORAL_TABLET | Freq: Two times a day (BID) | ORAL | 0 refills | Status: AC

## 2019-09-29 MED FILL — QUETIAPINE FUMARATE 25 MG T: 25 | 30 days supply | Qty: 30 | Fill #0

## 2019-09-29 MED FILL — LISINOPRIL 20 MG TABLET: 20 | 30 days supply | Qty: 60 | Fill #0

## 2019-09-29 NOTE — Progress Notes (Signed)
Patient's dc'd verbaliuzed understanding including medication, Rx and ongoing care. Iv removed tip intack. Pt's AOx4 with family at bedside

## 2019-09-29 NOTE — Discharge Summary (Signed)
Physician Discharge Summary  Ronald Bird [id number] DOB: [birth year]   PCP: Emmaline Kluver, MD  Admit date: 09/21/2019 Discharge date: 09/29/2019 Length of Stay: 8 days   Code Status: DNR  Admitted From:  Home Discharged to:   [place]: Yes  Equipment/Devices:  None Discharge Condition:  Stable  Recommendations for Outpatient Follow-up   1. Follow up with PCP in 1 week 2. Follow up BMP/CBC  3. Needs close follow-up for depression  Hospital Summary  84 year old with past medical history significant for pancreatic cancer with recent a stent placed status post biliary obstruction on 12//2021, hyperlipidemia, A. fib, hypertension, CAD, hypothyroidism, BPH who was discharged from hospital 09-20-2019. Patient was admitted on 09/18/2019 for biliary sepsis acute cholangitis. He was a started on oral Augmentin. He had elevated liver function test that were trending down on discharge and his statins was held. Per daughter patient was not feeling well the day prior to readmission, was off. He developed a rash in his hands arms and chest after taking Augmentin. He presented to the ED on 09-21-2019 with altered mental status, word finding difficulties. Since his readmission GI was reconsulted and he was placed on ciprofloxacin for cholangitis with plan for a stent exchange on 12/15. He had an MRI on presentation for his aphasia, which was negative for stroke. On 2/14, patient was worse again, becoming unresponsive at times with waxing and waning encephalopathy. Neurology was consulted and plan was to transfer to [facility] for EEG and additional neurology evaluation.  Of note patient had an episode of bradycardia and hypotension and unresponsiveness that improved with trend [place] position. This was thought to be related to vasovagal response.  2/17:  ERCP with stent replacement.  A. fib with RVR post ERCP with improvement after beta-blocker given.  2/19: Likely stable  for discharge, refusing SNF.  Reevaluated by PT who changed plan to home with home health.  Capacity evaluation by hospitalist team patient capable of making his own decision.  Discharged in stable condition without any antibiotics patient was afebrile and hemodynamically stable without any leukocytosis or discomfort and this was discussed and approved by GI.  A & P   Active Problems:   HYPERCHOLESTEROLEMIA   Essential hypertension   Paroxysmal atrial fibrillation (HCC)   Coronary artery disease   Biliary obstruction due to cancer [facility])   Pancreatic adenocarcinoma (HCC)   Dysarthria   Confusion   Hypothyroidism   Drug reaction    1-Acute metabolic encephalopathy, word finding difficulty, delirium, resolved Delirium, multifactorial; infectious process cholangitis, antibiotics (Cipro)  MRI negative for Stroke. EEG negative for seizure.  PRN IV ativan ordered.  B 12, TSH , Ammonia level normal.  Cultures with no growth to date AAOx3 today and not talking about death as he did yesterday, seems at/near baseline Neuro signed off Continue with Seroquel at discharge Capacity eval 2/19 revealed patient has capacity to make decisions (see previous note from today for further detail)  2-Vasovagal episode, hypotension bradycardia: Resolved  Troponin negative.   3-Abdominal pain secondary to constipation and acute urinary retention and likely from cholangitis KUB; fecal impaction resolved.  Continue with bowel regimen.   4-Cholangitis, Biliary Obstruction, Pancreatic Adenocarcinoma;  Ciprofloxacin change to ceftriaxone and completed 4 days ceftriaxone total to avoid worsening delirium.  2/17 ERCP stent exchange GI signed off 2/18 and initially recommended two more days PO antibiotics.  Patient hemodynamically stable on room air without any signs/symptoms of infection.  Okay to discharge without any antibiotics Per  GI, if clinical worsening, consider cholecystitis  5-Drug reaction  Augmentin;resolved.   6-paroxysmal A fib; A. fib with RVR RVR post ERCP today rates 150-170, improved with beta-blocker Off Eliquis and was on heparin prior to procedure and currently on heparin Can restart eliquis at discharge  HTN; PRN hydralazine. Continue with hydrochlorothiazide, lisinopril, metoprolol.  Depression: Related to medical condition.  Consider SSRI outpatient BPH; continue with Flomax. Hypokalemia: Replete orally  Estimated body mass index is 24.54 kg/m as calculated from the following: Height as of this encounter: 5\' 11"  (1.803 m). Weight as of this encounter: 79.8 kg.     Consultants  . GI  Procedures  . 2/17 ERCP and stent exchange  Antibiotics   Anti-infectives (From admission, onward)   Start     Dose/Rate Route Frequency Ordered Stop   09/26/19 0600  cefTRIAXone (ROCEPHIN) 2 g in sodium chloride 0.9 % 100 mL IVPB     2 g 200 mL/hr over 30 Minutes Intravenous Every 24 hours 09/25/19 0832     09/21/19 1600  ciprofloxacin (CIPRO) IVPB 400 mg  Status:  Discontinued     400 mg 200 mL/hr over 60 Minutes Intravenous Every 12 hours 09/21/19 1547 09/25/19 0831   09/21/19 1415  ceFEPIme (MAXIPIME) 2 g in sodium chloride 0.9 % 100 mL IVPB  Status:  Discontinued     2 g 200 mL/hr over 30 Minutes Intravenous  Once 09/21/19 1400 09/21/19 1547   09/21/19 1415  metroNIDAZOLE (FLAGYL) IVPB 500 mg  Status:  Discontinued     500 mg 100 mL/hr over 60 Minutes Intravenous  Once 09/21/19 1400 09/21/19 1547       Subjective  Patient states that he is not going to SNF today.  He is adamant about going home.  Reevaluated by PT who recommended home health.  Capacity eval by me today revealed patient is capable of making decision.  Though he does have depression he does not have any suicidal or homicidal ideation or plan.  He was tearful during our discussion as he is concerned about his pancreatic cancer but states that he has a good connection with God and  has work yet to do on this earth.  Denies any complaints at all.  Objective   Discharge Exam: Vitals:   09/29/19 0802 09/29/19 1229  BP: (!) 171/83 (!) 147/69  Pulse: (!) 59 (!) 57  Resp: 18 19  Temp: 98.2 F (36.8 C) 98.6 F (37 C)  SpO2: 97% 98%   Vitals:   09/29/19 0031 09/29/19 0506 09/29/19 0802 09/29/19 1229  BP: (!) 160/71 (!) 160/72 (!) 171/83 (!) 147/69  Pulse: 64 (!) 57 (!) 59 (!) 57  Resp: 19 17 18 19   Temp: 98.7 F (37.1 C) 98.1 F (36.7 C) 98.2 F (36.8 C) 98.6 F (37 C)  TempSrc: Oral Oral Oral Oral  SpO2: 97% 97% 97% 98%  Weight:      Height:        Physical Exam Vitals and nursing note reviewed.  Constitutional:      Appearance: Normal appearance.  HENT:     Head: Normocephalic and atraumatic.  Eyes:     Conjunctiva/sclera: Conjunctivae normal.  Cardiovascular:     Rate and Rhythm: Normal rate and regular rhythm.  Pulmonary:     Effort: Pulmonary effort is normal.     Breath sounds: Normal breath sounds.  Abdominal:     General: Abdomen is flat.     Palpations: Abdomen is soft.  Musculoskeletal:  General: No swelling or tenderness.  Skin:    Coloration: Skin is not jaundiced or pale.  Neurological:     Mental Status: He is alert. Mental status is at baseline.  Psychiatric:        Mood and Affect: Mood is depressed. Affect is tearful.        Behavior: Behavior normal.        Thought Content: Thought content is not delusional. Thought content does not include homicidal or suicidal ideation.       The results of significant diagnostics from this hospitalization (including imaging, microbiology, ancillary and laboratory) are listed below for reference.     Microbiology: Recent Results (from the past 240 hour(s))  Urine culture     Status: None   Collection Time: 09/21/19  1:05 PM   Specimen: Urine, Clean Catch  Result Value Ref Range Status   Specimen Description   Final    URINE, CLEAN CATCH Performed at [facility], [place] [address] [place], [place] [address]    Special Requests   Final    NONE Performed at [facility], [place] [address] [place], [place] [address]    Culture   Final    NO GROWTH Performed at [facility], [place] [address] [place], [place] [address]    Report Status 09/23/2019 FINAL  Final  MRSA PCR Screening     Status: None   Collection Time: 09/24/19  8:19 AM   Specimen: Nasal Mucosa; Nasopharyngeal  Result Value Ref Range Status   MRSA by PCR NEGATIVE NEGATIVE Final    Comment:        The GeneXpert MRSA Assay (FDA approved for NASAL specimens only), is one component of a comprehensive MRSA colonization surveillance program. It is not intended to diagnose MRSA infection nor to guide or monitor treatment for MRSA infections. Performed at [facility], [place] [address], [place], [place] [address]   Culture, blood (routine x 2)     Status: None (Preliminary result)   Collection Time: 09/24/19  7:26 PM   Specimen: BLOOD  Result Value Ref Range Status   Specimen Description   Final    BLOOD LEFT ARM Performed at [place] [address], [place], [place] [address]    Special Requests   Final    BOTTLES DRAWN AEROBIC AND ANAEROBIC Blood Culture adequate volume Performed at [place] [address], [place], [place] [address]    Culture   Final    NO GROWTH 4 DAYS Performed at [facility], [place] [address] [place], [place] [address]    Report Status PENDING  Incomplete  Culture, blood (routine x 2)     Status: None (Preliminary result)   Collection Time: 09/24/19  7:26 PM   Specimen: BLOOD  Result Value Ref Range Status   Specimen Description   Final    BLOOD RIGHT ARM Performed at [place] [address] [place], [place] [address]    Special Requests   Final    BOTTLES DRAWN AEROBIC AND ANAEROBIC Blood Culture adequate volume Performed  at [place] [address] [place], [place] [address]    Culture   Final    NO GROWTH 4 DAYS Performed at [facility], [place] [address] [place], [place] [address]    Report Status PENDING  Incomplete  SARS CORONAVIRUS 2 (TAT 6-24 HRS) Nasopharyngeal Nasopharyngeal Swab  Status: None   Collection Time: 09/28/19  1:43 PM   Specimen: Nasopharyngeal Swab  Result Value Ref Range Status   SARS Coronavirus 2 NEGATIVE NEGATIVE Final    Comment: (NOTE) SARS-CoV-2 target nucleic acids are NOT DETECTED. The SARS-CoV-2 RNA is generally detectable in upper and lower respiratory specimens during the acute phase of infection. Negative results do not preclude SARS-CoV-2 infection, do not rule out co-infections with other pathogens, and should not be used as the sole basis for treatment or other patient management decisions. Negative results must be combined with clinical observations, patient history, and epidemiological information. The expected result is Negative. Fact Sheet for Patients: SugarRoll.be Fact Sheet for Healthcare Providers: https://www.woods-mathews.com/ This test is not yet approved or cleared by the [place] FDA and  has been authorized for detection and/or diagnosis of SARS-CoV-2 by FDA under an Emergency Use Authorization (EUA). This EUA will remain  in effect (meaning this test can be used) for the duration of the COVID-19 declaration under Section 56 4(b)(1) of the Act, 21 U.S.C. section 360bbb-3(b)(1), unless the authorization is terminated or revoked sooner. Performed at [facility], [place] [address] [place], [place] [address]      Labs: BNP (last 3 results) No results for input(s): BNP in the last 8760 hours. Basic Metabolic Panel: Recent Labs  Lab 09/23/19 0500 09/23/19 0500 09/24/19 0517 09/24/19 0843 09/25/19 1115 09/26/19 0241 09/27/19 0957 09/28/19 0310 09/29/19 0450   NA 134*   < > 135   < > 135 134* 135 131* 138  K 3.5   < > 3.1*   < > 3.9 3.4* 3.4* 3.8 3.7  CL 104   < > 104   < > 103 105 102 102 106  CO2 22   < > 23   < > 21* 20* 21* 20* 23  GLUCOSE 118*   < > 129*   < > 112* 101* 99 148* 96  BUN 19   < > 18   < > 16 12 13 16 13   CREATININE 0.77   < > 0.90   < > 0.75 0.86 0.71 0.79 1.01  CALCIUM 8.5*   < > 8.7*   < > 8.6* 8.4* 8.3* 8.4* 8.2*  MG 1.8  --  1.8  --   --   --   --  1.9  --   PHOS 3.5  --  3.2  --   --   --   --   --   --    < > = values in this interval not displayed.   Liver Function Tests: Recent Labs  Lab 09/24/19 0517 09/26/19 0241 09/27/19 0957 09/28/19 0310 09/29/19 0450  AST 113* 103* 79* 58* 70*  ALT 142* 148* 114* 89* 88*  ALKPHOS 334* 315* 263* 211* 194*  BILITOT 0.9 1.3* 0.9 0.9 0.6  PROT 6.3* 5.9* 5.9* 5.3* 5.1*  ALBUMIN 3.3* 3.0* 2.8* 2.6* 2.6*   Recent Labs  Lab 09/24/19 1509  LIPASE 13   Recent Labs  Lab 09/24/19 0959  AMMONIA 30   CBC: Recent Labs  Lab 09/23/19 0500 09/23/19 0500 09/24/19 0517 09/24/19 0517 09/24/19 0843 09/26/19 0241 09/27/19 0245 09/28/19 0310 09/29/19 0450  WBC 5.7   < > 6.3   < > 7.1 7.5 6.3 7.1 6.9  NEUTROABS 3.7  --  3.9  --   --   --   --   --   --   HGB 11.1*   < >  11.7*   < > 12.5* 12.2* 12.0* 11.1* 10.6*  HCT 32.4*   < > 33.6*   < > 36.4* 34.3* 34.4* 31.7* 30.5*  MCV 95.0   < > 94.6   < > 94.5 92.0 95.0 93.2 93.8  PLT 157   < > 187   < > 200 231 202 211 220   < > = values in this interval not displayed.   Cardiac Enzymes: Recent Labs  Lab 09/24/19 0843  CKTOTAL 39*   BNP: Invalid input(s): POCBNP CBG: Recent Labs  Lab 09/24/19 0729 09/24/19 1407 09/24/19 2011 09/25/19 0323  GLUCAP 108* 110* 135* 106*   D-Dimer No results for input(s): DDIMER in the last 72 hours. Hgb A1c No results for input(s): HGBA1C in the last 72 hours. Lipid Profile No results for input(s): CHOL, HDL, LDLCALC, TRIG, CHOLHDL, LDLDIRECT in the last 72 hours. Thyroid  function studies No results for input(s): TSH, T4TOTAL, T3FREE, THYROIDAB in the last 72 hours.  Invalid input(s): FREET3 Anemia work up No results for input(s): VITAMINB12, FOLATE, FERRITIN, TIBC, IRON, RETICCTPCT in the last 72 hours. Urinalysis    Component Value Date/Time   COLORURINE YELLOW 09/21/2019 [place] 09/21/2019 1305   LABSPEC 1.010 09/21/2019 1305   PHURINE 6.0 09/21/2019 1305   GLUCOSEU NEGATIVE 09/21/2019 1305   GLUCOSEU NEGATIVE 05/11/2007 0948   HGBUR NEGATIVE 09/21/2019 1305   BILIRUBINUR NEGATIVE 09/21/2019 1305   KETONESUR NEGATIVE 09/21/2019 1305   PROTEINUR NEGATIVE 09/21/2019 1305   UROBILINOGEN 0.2 mg/dL 05/11/2007 0948   NITRITE NEGATIVE 09/21/2019 1305   LEUKOCYTESUR NEGATIVE 09/21/2019 1305   Sepsis Labs Invalid input(s): PROCALCITONIN,  WBC,  LACTICIDVEN Microbiology Recent Results (from the past 240 hour(s))  Urine culture     Status: None   Collection Time: 09/21/19  1:05 PM   Specimen: Urine, Clean Catch  Result Value Ref Range Status   Specimen Description   Final    URINE, CLEAN CATCH Performed at [facility], [place] [address], [place], [place] [address]    Special Requests   Final    NONE Performed at [facility], [place] [address] [place], [place] [address]    Culture   Final    NO GROWTH Performed at [facility], [place] [address] [place], [place] [address]    Report Status 09/23/2019 FINAL  Final  MRSA PCR Screening     Status: None   Collection Time: 09/24/19  8:19 AM   Specimen: Nasal Mucosa; Nasopharyngeal  Result Value Ref Range Status   MRSA by PCR NEGATIVE NEGATIVE Final    Comment:        The GeneXpert MRSA Assay (FDA approved for NASAL specimens only), is one component of a comprehensive MRSA colonization surveillance program. It is not intended to diagnose MRSA infection nor to guide or monitor treatment for MRSA infections. Performed at [facility], [place] [address] [place], [place] [address]   Culture, blood (routine x 2)     Status: None (Preliminary result)   Collection Time: 09/24/19  7:26 PM   Specimen: BLOOD  Result Value Ref Range Status   Specimen Description   Final    BLOOD LEFT ARM Performed at [place] [address], [place], [place] [address]    Special Requests   Final    BOTTLES DRAWN AEROBIC AND ANAEROBIC Blood Culture adequate volume Performed at [place] [address] [place],  [place] [address]    Culture   Final    NO GROWTH 4 DAYS Performed at [facility], [place] [address], [place], [place] [address]    Report Status PENDING  Incomplete  Culture, blood (routine x 2)     Status: None (Preliminary result)   Collection Time: 09/24/19  7:26 PM   Specimen: BLOOD  Result Value Ref Range Status   Specimen Description   Final    BLOOD RIGHT ARM Performed at [place] [address] [place], [place] [address]    Special Requests   Final    BOTTLES DRAWN AEROBIC AND ANAEROBIC Blood Culture adequate volume Performed at [place] [address], [place], [place] [address]    Culture   Final    NO GROWTH 4 DAYS Performed at [facility], [place] [address] [place], [place] [address]    Report Status PENDING  Incomplete  SARS CORONAVIRUS 2 (TAT 6-24 HRS) Nasopharyngeal Nasopharyngeal Swab     Status: None   Collection Time: 09/28/19  1:43 PM   Specimen: Nasopharyngeal Swab  Result Value Ref Range Status   SARS Coronavirus 2 NEGATIVE NEGATIVE Final    Comment: (NOTE) SARS-CoV-2 target nucleic acids are NOT DETECTED. The SARS-CoV-2 RNA is generally detectable in upper and lower respiratory specimens during the acute phase of infection. Negative results do not preclude SARS-CoV-2 infection, do not rule out co-infections with other pathogens, and should not be used as the sole basis for  treatment or other patient management decisions. Negative results must be combined with clinical observations, patient history, and epidemiological information. The expected result is Negative. Fact Sheet for Patients: SugarRoll.be Fact Sheet for Healthcare Providers: https://www.woods-mathews.com/ This test is not yet approved or cleared by the [place] FDA and  has been authorized for detection and/or diagnosis of SARS-CoV-2 by FDA under an Emergency Use Authorization (EUA). This EUA will remain  in effect (meaning this test can be used) for the duration of the COVID-19 declaration under Section 56 4(b)(1) of the Act, 21 U.S.C. section 360bbb-3(b)(1), unless the authorization is terminated or revoked sooner. Performed at [facility], [place] [address] [place], [place] [address]     Discharge Instructions     Discharge Instructions    Diet - low sodium heart healthy   Complete by: As directed    Discharge instructions   Complete by: As directed    You were seen and examined in the hospital for an infection and cared for by a hospitalist.   Upon Discharge:  - Take lisinopril 20 mg twice daily instead of your normal dose - take Seroquel 25 mg nightly - you do not need any antibiotics at discharge - get up slowly  - work with physical therapy at home Make an appointment with your primary care physician within 7 days Get lab work prior to your follow up appointment with your PCP Bring all home medications to your appointment to review Request that your primary physician go over all hospital tests and procedures/radiological results at the follow up.   Please get all hospital records sent to your physician by signing a hospital release before you go home.   Read the complete instructions along with all the possible side effects for all the medicines you take and that have been prescribed to you. Take any new medicines after  you have completely understood and accept all the possible adverse reactions/side effects.   If you  have any questions about your discharge medications or the care you received while you were in the hospital, you can call the unit and asked to speak with the hospitalist on call. Once you are discharged, your primary care physician will handle any further medical issues. Please note that NO REFILLS for any discharge medications will be authorized, as it is imperative that you return to your primary care physician (or establish a relationship with a primary care physician if you do not have one) for your aftercare needs so that they can reassess your need for medications and monitor your lab values.   Do not drive, operate heavy machinery, perform activities at heights, swimming or participation in water activities or provide baby sitting services if your were admitted for loss of consciousness/seizures or if you are on sedating medications including, but not limited to benzodiazepines, sleep medications, narcotic pain medications, etc., until you have been cleared to do so by a medical doctor.   Do not take more than prescribed medications.   Wear a seat belt while driving.  If you have smoked or chewed Tobacco in the last 2 years please stop smoking; also stop any regular Alcohol and/or any Recreational drug use including marijuana.  If you experience worsening of your admission symptoms or develop shortness of breath, chest pain, suicidal or homicidal thoughts or experience a life threatening emergency, you must seek medical attention immediately by calling 911 or calling your PCP immediately.   Increase activity slowly   Complete by: As directed      Allergies as of 09/29/2019      Reactions   Augmentin [amoxicillin-pot Clavulanate] Rash   Did it involve swelling of the face/tongue/throat, SOB, or low BP? No Did it involve sudden or severe rash/hives, skin peeling, or any reaction on the  inside of your mouth or nose? No Did you need to seek medical attention at a hospital or doctor's office? Yes When did it last happen?Feb 2021 If all above answers are "NO", may proceed with cephalosporin use.      Medication List    STOP taking these medications   B-12 2500 MCG Subl   Cinnamon 500 MG Tabs   CITRUCEL PO   glucosamine-chondroitin 500-400 MG tablet   Lycopene 10 MG Caps     TAKE these medications   apixaban 5 MG Tabs tablet Commonly known as: Eliquis Take 1 tablet (5 mg total) by mouth 2 (two) times daily.   calcium carbonate 500 MG chewable tablet Commonly known as: TUMS - dosed in mg elemental calcium Chew 1 tablet by mouth as needed for indigestion or heartburn.   clopidogrel 75 MG tablet Commonly known as: PLAVIX Take 75 mg by mouth daily.   docusate sodium 100 MG capsule Commonly known as: COLACE Take 100 mg by mouth at bedtime.   fluticasone 50 MCG/ACT nasal spray Commonly known as: FLONASE Place 2 sprays into both nostrils daily for 30 days. What changed:   when to take this  reasons to take this   hydrochlorothiazide 12.5 MG capsule Commonly known as: MICROZIDE TAKE 1 CAPSULE BY MOUTH ONCE DAILY.   levothyroxine 25 MCG tablet Commonly known as: SYNTHROID Take 25 mcg by mouth daily before breakfast.   lisinopril 20 MG tablet Commonly known as: ZESTRIL Take 1 tablet (20 mg total) by mouth 2 (two) times daily. What changed:   medication strength  how much to take  when to take this   magnesium oxide 400 MG tablet Commonly known  as: MAG-OX Take 400 mg by mouth daily at 12 noon.   metoprolol tartrate 25 MG tablet Commonly known as: LOPRESSOR Take 1 tablet (25 mg total) by mouth 2 (two) times daily.   multivitamin capsule Take 1 capsule by mouth daily at 12 noon.   nitroGLYCERIN 0.4 MG SL tablet Commonly known as: NITROSTAT Place 1 tablet (0.4 mg total) under the tongue every 5 (five) minutes as needed for chest  pain.   omega-3 acid ethyl esters 1 g capsule Commonly known as: LOVAZA Take 1,400 mg by mouth 2 (two) times daily.   ondansetron 4 MG tablet Commonly known as: ZOFRAN Take 1 tablet (4 mg total) by mouth every 6 (six) hours as needed for nausea.   pantoprazole 40 MG tablet Commonly known as: PROTONIX Take 1 tablet (40 mg total) by mouth daily. 30 mins before a meal   polyvinyl alcohol 1.4 % ophthalmic solution Commonly known as: LIQUIFILM TEARS Place 1 drop into both eyes as needed for dry eyes.   PROBIOTIC DAILY PO Take 1 tablet by mouth daily at 12 noon.   QUEtiapine 25 MG tablet Commonly known as: SEROQUEL Take 1 tablet (25 mg total) by mouth at bedtime.   Saw Palmetto (Serenoa repens) 1000 MG Caps Take 2,000 mg by mouth 2 (two) times daily.   tamsulosin 0.4 MG Caps capsule Commonly known as: FLOMAX Take 0.4 mg by mouth at bedtime.   Vitamin D (Ergocalciferol) 1.25 MG (50000 UNIT) Caps capsule Commonly known as: DRISDOL Take 50,000 Units by mouth every Tuesday.       Allergies  Allergen Reactions  . Augmentin [Amoxicillin-Pot Clavulanate] Rash    Did it involve swelling of the face/tongue/throat, SOB, or low BP? No Did it involve sudden or severe rash/hives, skin peeling, or any reaction on the inside of your mouth or nose? No Did you need to seek medical attention at a hospital or doctor's office? Yes When did it last happen?Feb 2021 If all above answers are "NO", may proceed with cephalosporin use.    Time coordinating discharge: Over 30 minutes   SIGNED:   Harold Hedge, D.O. [facility] Pager: [phone]  09/29/2019, 6:43 PM

## 2019-09-29 NOTE — Progress Notes (Addendum)
ANTICOAGULATION CONSULT NOTE - Follow Up Consult  Pharmacy Consult for Heparin Indication: atrial fibrillation  Allergies  Allergen Reactions  . Augmentin [Amoxicillin-Pot Clavulanate] Rash    Did it involve swelling of the face/tongue/throat, SOB, or low BP? No Did it involve sudden or severe rash/hives, skin peeling, or any reaction on the inside of your mouth or nose? No Did you need to seek medical attention at a hospital or doctor's office? Yes When did it last happen?Feb 2021 If all above answers are "NO", may proceed with cephalosporin use.    Patient Measurements: Height: 5\' 11"  (180.3 cm) Weight: 175 lb 14.8 oz (79.8 kg) IBW/kg (Calculated) : 75.3 Heparin Dosing Weight:    Vital Signs: Temp: 98.2 F (36.8 C) (02/19 0802) Temp Source: Oral (02/19 0802) BP: 171/83 (02/19 0802) Pulse Rate: 59 (02/19 0802)  Labs: Recent Labs    09/27/19 0245 09/27/19 0245 09/27/19 0957 09/28/19 0310 09/28/19 1805 09/29/19 0450  HGB 12.0*   < >  --  11.1*  --  10.6*  HCT 34.4*  --   --  31.7*  --  30.5*  PLT 202  --   --  211  --  220  HEPARINUNFRC 0.30   < >  --  <0.10* 0.21* 0.34  CREATININE  --   --  0.71 0.79  --  1.01   < > = values in this interval not displayed.    Estimated Creatinine Clearance: 61.1 mL/min (by C-G formula based on SCr of 1.01 mg/dL).  Assessment: Anticoag:  Apixaban held since 2/11 for anticipated ERCP with stent exchange. Heparin started 2/14.ERCP with stent replacement. Hep resumed 2/19.  - HL 0.34. Hgb down to 10.6. Plts 220 stable.  Goal of Therapy:  Heparin level 0.3-0.5 Monitor platelets by anticoagulation protocol: Yes   Plan:  - Con't IV heparin 1300 units/hr - Daily HL and CBC - F/U plans to return to apixaban 5mg  BID and resume home Plavix? - IV >po Synthroid - Home meds not yet resumed: Plavix, Magox, MV, Lovaza,  Probiotic, Vit D/Tuesday   Britiny Defrain S. Alford Highland, PharmD, BCPS Clinical Staff Pharmacist [email or web address] Alford Highland,  Kaidance Pantoja Stillinger 09/29/2019,8:28 AM

## 2019-09-29 NOTE — Progress Notes (Signed)
Physical Therapy Treatment Patient Details Name: Ronald Bird MRN: [id number] DOB: 04-20-1936 Today's Date: 09/29/2019    History of Present Illness Pt is an 84 yo male admitted to [facility] with obstructed pancreatic cancer- newly diagnosed this year. Pt is s/p biliary stent 2/4- is scheduled for ERCP 09/27/19.  Pt presented with word finding and short term memory deficits.  His MRI was negative for acute findings.  A rash had developed on his hands and chest after augmentin intake.  Pt EEG c/w moderate diffuse encephalopathy.    PT Comments    Patient progressing well towards PT goals. Reports feeling like himself today and eager to return home. Tolerated bed mobility and transfers with Min guard to supervision for safety. Tolerated gait training with use of RW and supervision for safety. VSS throughout. Pt's cognition seems to be close to baseline. Discharge recommendation updated to Home with HHPT vs OPPT even though pt declining follow up PT services. Will follow.     Follow Up Recommendations  Home health PT;Supervision - Intermittent     Equipment Recommendations  None recommended by PT    Recommendations for Other Services       Precautions / Restrictions Precautions Precautions: Fall Restrictions Weight Bearing Restrictions: No    Mobility  Bed Mobility Overal bed mobility: Needs Assistance Bed Mobility: Supine to Sit     Supine to sit: Supervision;HOB elevated Sit to supine: Supervision;HOB elevated   General bed mobility comments: Supervision for safety.  Transfers Overall transfer level: Needs assistance Equipment used: Rolling walker (2 wheeled) Transfers: Sit to/from Stand Sit to Stand: Supervision         General transfer comment: Supervision for safety. Stood from [employer].  Ambulation/Gait Ambulation/Gait assistance: Supervision Gait Distance (Feet): 400 Feet Assistive device: Rolling walker (2 wheeled) Gait Pattern/deviations: Step-through  pattern;Decreased stride length;Trunk flexed Gait velocity: 1.74 ft/sec Gait velocity interpretation: 1.31 - 2.62 ft/sec, indicative of limited community ambulator General Gait Details: Slow, steady gait with RW for support. Aware of need to go slow for safety.   Stairs             Wheelchair Mobility    Modified Rankin (Stroke Patients Only)       Balance Overall balance assessment: Needs assistance Sitting-balance support: No upper extremity supported;Feet supported Sitting balance-Leahy Scale: Good     Standing balance support: During functional activity Standing balance-Leahy Scale: Poor Standing balance comment: requires Ue support.                            Cognition Arousal/Alertness: Awake/alert Behavior During Therapy: WFL for tasks assessed/performed Overall Cognitive Status: Within Functional Limits for tasks assessed                                 General Comments: Cognition seems back to baseline. Stating he is adamant about going home regardless, eager to get back to workign out 5 days/week and wood working (making a chest for his family member's wedding in September). Dry sense of humor.      Exercises      General Comments General comments (skin integrity, edema, etc.): VSS.      Pertinent Vitals/Pain Pain Assessment: No/denies pain    Home Living                      Prior Function  PT Goals (current goals can now be found in the care plan section) Progress towards PT goals: Progressing toward goals    Frequency    Min 3X/week      PT Plan Discharge plan needs to be updated    Co-evaluation              AM-PAC PT "6 Clicks" Mobility   Outcome Measure  Help needed turning from your back to your side while in a flat bed without using bedrails?: A Little Help needed moving from lying on your back to sitting on the side of a flat bed without using bedrails?: A Little Help  needed moving to and from a bed to a chair (including a wheelchair)?: A Little Help needed standing up from a chair using your arms (e.g., wheelchair or bedside chair)?: A Little Help needed to walk in hospital room?: A Little Help needed climbing 3-5 steps with a railing? : A Little 6 Click Score: 18    End of Session Equipment Utilized During Treatment: Gait belt Activity Tolerance: Patient tolerated treatment well Patient left: in bed;with call bell/phone within reach Nurse Communication: Mobility status PT Visit Diagnosis: Unsteadiness on feet (R26.81)     Time: 1100-1120 PT Time Calculation (min) (ACUTE ONLY): 20 min  Charges:  $Gait Training: 8-22 mins                     Marisa Severin, PT, DPT Acute Rehabilitation Services Pager [phone] Office [phone]       Marguarite Arbour A Sabra Heck 09/29/2019, 12:19 PM

## 2019-09-29 NOTE — Progress Notes (Signed)
Capacity Assessment:   1. Patient is able to explain their medical condition.  2. Patient is able to explain the proposed treatment.  3. Patient is able to explain alternatives to the proposed treatment.  4. Patient is understands that they can decline the proposed the treatment.  5. Patient is able to explain the possible consequences of accepting the proposed treatment.  6. Patient is able to explain the possible consequences of refusing the proposed treatment.  7. Depression is not influencing the patient's decision.  8. Delusion or psychosis is not influencing the patient's decision.   Overall impression:  Definitely capable  Comments:   The patient was previously noted to have altered mental status this appears to have resolved and he is clearly capable of making decisions for himself.  He was able to describe therapies that occurred in the hospital, plan for discharge and risks and benefits of going to SNF or home with home health.  Though he does have some depression he was adamant that this would not lead to suicide and stated he was not suicidal or homicidal and stated "I have worked to do yet "  Marva Panda, DO

## 2019-09-29 NOTE — Discharge Instructions (Signed)

## 2019-09-29 NOTE — Progress Notes (Signed)
ANTICOAGULATION CONSULT NOTE - Follow Up Consult  Pharmacy Consult for Heparin>>Eliquis Indication: atrial fibrillation  Allergies  Allergen Reactions  . Augmentin [Amoxicillin-Pot Clavulanate] Rash    Did it involve swelling of the face/tongue/throat, SOB, or low BP? No Did it involve sudden or severe rash/hives, skin peeling, or any reaction on the inside of your mouth or nose? No Did you need to seek medical attention at a hospital or doctor's office? Yes When did it last happen?Feb 2021 If all above answers are "NO", may proceed with cephalosporin use.    Patient Measurements: Height: 5\' 11"  (180.3 cm) Weight: 175 lb 14.8 oz (79.8 kg) IBW/kg (Calculated) : 75.3 Heparin Dosing Weight:    Vital Signs: Temp: 98.6 F (37 C) (02/19 1229) Temp Source: Oral (02/19 1229) BP: 147/69 (02/19 1229) Pulse Rate: 57 (02/19 1229)  Labs: Recent Labs    09/27/19 0245 09/27/19 0245 09/27/19 0957 09/28/19 0310 09/28/19 1805 09/29/19 0450  HGB 12.0*   < >  --  11.1*  --  10.6*  HCT 34.4*  --   --  31.7*  --  30.5*  PLT 202  --   --  211  --  220  HEPARINUNFRC 0.30   < >  --  <0.10* 0.21* 0.34  CREATININE  --   --  0.71 0.79  --  1.01   < > = values in this interval not displayed.    Estimated Creatinine Clearance: 61.1 mL/min (by C-G formula based on SCr of 1.01 mg/dL).  Assessment: Anticoag:  Apixaban held since 2/11 for anticipated ERCP with stent exchange. Heparin started 2/14.ERCP with stent replacement. Hep resumed 2/19.  - HL 0.34. Hgb down to 10.6. Plts 220 stable. Transition back to home Eliquis on 2/19  Goal of Therapy:  Heparin level 0.3-0.5 Monitor platelets by anticoagulation protocol: Yes   Plan:  - D/c IV heparin - Resume Eliquis 5mg  BID - Resume home Plavix? - IV >po Synthroid - Home meds not yet resumed: Plavix, Magox, MV, Lovaza,  Probiotic, Vit D/Tuesday   Kampbell Holaway S. Alford Highland, PharmD, BCPS Clinical Staff Pharmacist [email or web address] Alford Highland, Zaden Sako  Stillinger 09/29/2019,2:01 PM

## 2019-09-29 NOTE — Plan of Care (Signed)
  Problem: Pain Managment: Goal: General experience of comfort will improve Outcome: Not Progressing   Problem: Safety: Goal: Ability to remain free from injury will improve Outcome: Not Progressing   Problem: Education: Goal: Knowledge of disease or condition will improve Outcome: Not Progressing Goal: Knowledge of secondary prevention will improve Outcome: Not Progressing Goal: Knowledge of patient specific risk factors addressed and post discharge goals established will improve Outcome: Not Progressing   Problem: Coping: Goal: Will verbalize positive feelings about self Outcome: Not Progressing Goal: Will identify appropriate support needs Outcome: Not Progressing   Problem: Health Behavior/Discharge Planning: Goal: Ability to manage health-related needs will improve Outcome: Not Progressing   Problem: Self-Care: Goal: Ability to participate in self-care as condition permits will improve Outcome: Not Progressing   Problem: Nutrition: Goal: Risk of aspiration will decrease Outcome: Not Progressing

## 2019-09-30 LAB — CULTURE, BLOOD (ROUTINE X 2)
Culture: NO GROWTH
Culture: NO GROWTH
Special Requests: ADEQUATE
Special Requests: ADEQUATE

## 2019-10-01 DIAGNOSIS — N481 Balanitis: Secondary | ICD-10-CM | POA: Diagnosis not present

## 2019-10-01 DIAGNOSIS — K649 Unspecified hemorrhoids: Secondary | ICD-10-CM | POA: Diagnosis not present

## 2019-10-01 DIAGNOSIS — R3129 Other microscopic hematuria: Secondary | ICD-10-CM | POA: Diagnosis not present

## 2019-10-01 DIAGNOSIS — R21 Rash and other nonspecific skin eruption: Secondary | ICD-10-CM | POA: Diagnosis not present

## 2019-10-03 DIAGNOSIS — M6281 Muscle weakness (generalized): Secondary | ICD-10-CM | POA: Diagnosis not present

## 2019-10-03 DIAGNOSIS — C250 Malignant neoplasm of head of pancreas: Secondary | ICD-10-CM | POA: Diagnosis not present

## 2019-10-03 DIAGNOSIS — E782 Mixed hyperlipidemia: Secondary | ICD-10-CM | POA: Diagnosis not present

## 2019-10-03 DIAGNOSIS — F331 Major depressive disorder, recurrent, moderate: Secondary | ICD-10-CM | POA: Diagnosis not present

## 2019-10-03 NOTE — Progress Notes (Signed)
Spoke with patient's daughter Ronald Bird as an introductory call and hospital follow up.  She states the patient is staying with her in [facility] for the time being.  He is still mentally not completely clear especially regarding this past hospitalization.  He gets fixated on things, talks about being scare of dying and that he has much work left to do.  He is taking Seroquel 25 mg at night that was prescribed in the hospital.  She feels he is eating well 3 meals plus snacks but continues to loose weight.  Upon discharge from the hospital he was supposed to get referred for Home Health Physical therapy.  This has not happened.  She is concerned about logistics as far as bringing him to [facility] for treatment. It is approximately 1 hour and 40 minutes each way.  She feels if Dr. Benay Spice recommends he get treatment close to home that he will consider doing that.  I spoke with her at length.  I have told her I will get our SW to contact her as well as speak to Dr. Benay Spice about making a Home PT referral.  We moved his appointment from 3/4 at 9:15 to 3/2 at 2:15 due to the long distance to travel and the hardship of trying to get him here that early. I am making Dr. Benay Spice aware.

## 2019-10-04 ENCOUNTER — Telehealth: Payer: Self-pay | Admitting: General Practice

## 2019-10-04 NOTE — Telephone Encounter (Signed)
[facility] CSW Progress Notes  Call to daughter, Shelby Dubin, at request of nurse navigator.  Unable to reach, left VM w my contact information and encouragement to call back.  Edwyna Shell, LCSW Clinical Social Worker Phone:  [phone] Cell:  [phone]

## 2019-10-05 ENCOUNTER — Encounter: Payer: Self-pay | Admitting: General Practice

## 2019-10-05 DIAGNOSIS — Z23 Encounter for immunization: Secondary | ICD-10-CM | POA: Diagnosis not present

## 2019-10-05 NOTE — Progress Notes (Signed)
[facility] Initial Psychosocial Assessment Clinical Social Work  Clinical Social Work contacted by phone to assess psychosocial, emotional, mental health, and spiritual needs of the patient.   Barriers to care/review of distress screen:  - Transportation:  Do you anticipate any problems getting to appointments?  Do you have someone who can help run errands for you if you need it?  Daughter driving him to appointments.   - Help at home:  What is your living situation (alone, family, other)?  If you are physically unable to care for yourself, who would you call on to help you?  Lives w daughter in [place].  She prepares his meds, snacks and puts out medications.  Daughter does not think he can drive any more.  Pt gives himself bath in sink, daughter administers medications.  Now patient can transfer, walks w cane, toilets by himself.  Has HH PT from [facility] in [place].  Daughter will keep him wi her temporarily.  Unclear how long patient will live w daughter, but is steadily moving towards relocating to [place] at some point.   - Support system:  What does your support system look like?  Who would you call on if you needed some kind of practical help?  What if you needed someone to talk to for emotional support?  Prior to hospitalization, patient was 100% independent.  Did not require any support from daughter.  Daughter was becoming concerned about patient's ability to manage medications and finances, but otherwise he was independent.  Patient was "delerious" in th"[facility] hospital - "was disconnected from reality for a number of days", currently is on Seroquel.  Per daughter, he is slowly getting better but not back to prior function completely.  "Is moving in the right direction, when fatigued, he has more trouble,"  Has seen PCP   Wife died several years ago, had Alzheimers disease.  Son also died in the past.  Had visits from grandson and nephews.   - Finances:  Are you concerned about finances.   Considering returning to work?  If not, applying for disability?  Retired, has [employer] and Medicare supplement.  Does not have Medicaid.  What is your understanding of where you are with your cancer? Its cause?  Your treatment plan and what happens next? New diagnosis of cancer.  Usually deals with problems by focusing on the future and moving forward.  Now, w cancer, he is aware that he needs to "do something" to treat cancer.  Wants to "get on with it, get port in, wants to start treatment,."  Prior to [place], was going to gym and working out, had treadmill in his home, worked in [employer].  Is 'constant church goer" and that is is "social community."  Likes to be outdoors, maintained his own yard, gardened.    What are your worries for the future as you begin treatment for cancer?  Daughter concerned about distance to [facility], given that he is living with her while he recuperates from infections, hospitalization, cancer diagnosis.  Aware that this may be a more long term situation, especially as he undergoes treatment for cancer.  At this point, daughter is willing to have him live w her indefinately but she will also begin to research independent living options near her in the event patient cannot return to his own home at some point.    CSW Summary:  Patient and family psychosocial functioning including strengths, limitations, and coping skills:  84 year old male diagnosed w pancreatic cancer,  formerly living on his own in [place] and physically active.  Widower, wife died several years ago as did son.  Connected to community via church, also has supportive daughter ([place] area), nephews and grandson.  Recently hosptalized, was to discharge to SNF for rehab, however declined SNF placement on day of discharge.  Daughter agreed to take him into her home in [facility] area (1.5 hours distant from [facility]).  Per daughter, patient has improved both physically and mentally since discharge from inpatient.   Is not able to fully care for himself, but has progressed from using walker to using cane, can give himself a bath in the sink and similar.  Will have home health PT, awaiting call from therapist to schedule.  Daughter willing to house patient for near future. Patient, at this point, hopes to return to his own home at some point.  Daughter wondering if patient should transfer his oncology care to [facility] so that visits are closer to her home as she will be providing transport.  States that patient may resist this change as he is comfortable w current treatment team.  Daughter would like discussion of patients options and resources re treatment plan and location at next visit.  Daughter also aware that his illness may require his relocation to a place closer to her and has been encouraged to contact local senior resources agencies to begin process of researching options should this become necessary.    Identifications of barriers to care:  Distance from [facility], daughter needs to transport.   Availability of community resources: Home health w [place], local senior services agencies  Clinical Social Worker follow up needed: No.  Daughter has needed resources at this time and home situation is supportive.  Please reconsult if needs arise.    Edwyna Shell, LCSW Clinical Social Worker Phone:  [phone]

## 2019-10-06 ENCOUNTER — Ambulatory Visit: Payer: Medicare Other

## 2019-10-10 ENCOUNTER — Other Ambulatory Visit: Payer: Self-pay

## 2019-10-10 ENCOUNTER — Telehealth: Payer: Self-pay | Admitting: Nurse Practitioner

## 2019-10-10 ENCOUNTER — Inpatient Hospital Stay: Payer: Medicare Other | Attending: Nurse Practitioner | Admitting: Nurse Practitioner

## 2019-10-10 VITALS — BP 137/71 | HR 65 | Temp 98.5°F | Resp 17 | Ht 71.0 in | Wt 174.4 lb

## 2019-10-10 DIAGNOSIS — C250 Malignant neoplasm of head of pancreas: Secondary | ICD-10-CM | POA: Diagnosis not present

## 2019-10-10 DIAGNOSIS — D649 Anemia, unspecified: Secondary | ICD-10-CM | POA: Insufficient documentation

## 2019-10-10 DIAGNOSIS — I10 Essential (primary) hypertension: Secondary | ICD-10-CM | POA: Insufficient documentation

## 2019-10-10 DIAGNOSIS — I4891 Unspecified atrial fibrillation: Secondary | ICD-10-CM | POA: Diagnosis not present

## 2019-10-10 DIAGNOSIS — Z7901 Long term (current) use of anticoagulants: Secondary | ICD-10-CM | POA: Diagnosis not present

## 2019-10-10 NOTE — Progress Notes (Addendum)
[facility] OFFICE PROGRESS NOTE   Diagnosis: Pancreas cancer  INTERVAL HISTORY:   Ronald Bird returns for follow-up.  He is accompanied to today's visit by his son-in-law and grandson.  He is currently living with his daughter and son-in-law.  His performance status is improving.  He was initially utilizing a walker for ambulation.  He is now ambulating independently.  He reports a good appetite.  He is trying to gain weight.  Overall good energy level.  He denies pain.  No fever.  No confusion.  Bowels are moving.  No diarrhea.  No nausea or vomiting.  Objective:  Vital signs in last 24 hours:  Blood pressure 137/71, pulse 65, temperature 98.5 F (36.9 C), temperature source Oral, resp. rate 17, height 5\' 11"  (1.803 m), weight 174 lb 6.4 oz (79.1 kg), SpO2 100 %.    HEENT: No thrush or ulcers. Resp: Lungs clear bilaterally. Cardio: Regular rate and rhythm. GI: Abdomen soft and nontender.  No hepatomegaly.  No mass. Vascular: No leg edema. Neuro: Alert and oriented. Skin: Ecchymoses scattered over the dorsum of both hands.   Lab Results:  Lab Results  Component Value Date   WBC 6.9 09/29/2019   HGB 10.6 (L) 09/29/2019   HCT 30.5 (L) 09/29/2019   MCV 93.8 09/29/2019   PLT 220 09/29/2019   NEUTROABS 3.9 09/24/2019    Imaging:  No results found.  Medications: I have reviewed the patient's current medications.  Assessment/Plan: 1.  Pancreatic carcinoma             -ERCP 09/14/2019-mid bile duct stricture, placement of a plastic biliary stent             -Biliary stent placement on 09/14/2019             -EUS 09/14/2019-irregular 30 mm x 24 mm pancreas head mass causing pancreatic and biliary duct obstruction, abuts the PV confluence, no peripancreatic adenopathy              -09/14/2019 FNA biopsy, malignant cells consistent with adenocarcinoma 2.  Atrial fibrillation -on Eliquis  3.  Mild anemia 4.  Acute cholangitis, improved 5.  Hypertension 6.   Admission with probable acute cholangitis 09/18/2019 7.  Thoracic aortic aneurysm  Disposition: Ronald Bird appears improved.  Dr. Benay Spice discussed treatment options with Ronald Bird and his family at today's visit.  He does not appear to be a candidate for surgery.  We discussed observation, chemotherapy and radiation therapy.  Dr. Benay Spice recommends a trial of gemcitabine/Abraxane every 2 weeks.  We reviewed potential toxicities associated with chemotherapy including bone marrow toxicity, nausea, hair loss, allergic reaction.  We discussed the possibility of fever, rash, pneumonitis with gemcitabine.  We discussed the potential for neuropathy with Abraxane.  He would benefit from having a Port-A-Cath.  Ronald Bird is currently living with his daughter and son-in-law in [place].  He will discuss the above treatment options with his family and where he would like to receive treatment.  We are scheduling a virtual visit with Dr. Benay Spice in 1 week and will make any needed referrals at that time.  Patient seen with Dr. Benay Spice.    Ned Card ANP/GNP-BC   10/10/2019  2:46 PM  This was a shared visit with Ned Card.  Ronald Bird is recovering from the recent hospital admissions with cholangitis and delirium.  He has been diagnosed with locally advanced pancreas cancer, borderline resectable.  He is not a surgical candidate.  We discussed treatment with chemotherapy, radiation, and supportive care.  I recommend considering a trial of gemcitabine/Abraxane with follow-up of the CA 19-9 and pancreas mass.  If he has localized disease after 4 to 6 months of treatment we could consider definitive radiation.  Ronald Bird and his family are considering whether he will be living in [place] or [place]  If he relocates to [facility] I recommend he be cared for medical oncology there.  He will be scheduled for a virtual office visit and further discussion next  week.

## 2019-10-10 NOTE — Telephone Encounter (Signed)
Scheduled per 3/2 los. Gave avs and calendar 

## 2019-10-12 ENCOUNTER — Ambulatory Visit: Payer: Medicare Other | Admitting: Nurse Practitioner

## 2019-10-13 NOTE — Progress Notes (Signed)
Patient's daughter calls to let us know that they have decided for him to get his care in [place] at [facility].  She requested his records from [place] be faxed to them which I have done.  I made Dr. Benay Spice his current Med/Onc aware of their decision.  The records were faxed to Attn: [facility] Patient Coordinator at Fax [phone].  Their contact number is [phone]

## 2019-10-16 ENCOUNTER — Encounter: Payer: Self-pay | Admitting: *Deleted

## 2019-10-16 NOTE — Progress Notes (Signed)
Received fax from [facility] that patient has been scheduled to see Dr. Grayland Jack on 10/24/2019 at 11:00 Phone [phone] Fax [phone]

## 2019-10-17 ENCOUNTER — Telehealth: Payer: Medicare Other | Admitting: Oncology

## 2019-10-17 DIAGNOSIS — I781 Nevus, non-neoplastic: Secondary | ICD-10-CM | POA: Diagnosis not present

## 2019-10-24 DIAGNOSIS — K831 Obstruction of bile duct: Secondary | ICD-10-CM | POA: Diagnosis not present

## 2019-10-24 DIAGNOSIS — C259 Malignant neoplasm of pancreas, unspecified: Secondary | ICD-10-CM | POA: Diagnosis not present

## 2019-10-24 DIAGNOSIS — C801 Malignant (primary) neoplasm, unspecified: Secondary | ICD-10-CM | POA: Diagnosis not present

## 2019-10-24 DIAGNOSIS — R9389 Abnormal findings on diagnostic imaging of other specified body structures: Secondary | ICD-10-CM | POA: Diagnosis not present

## 2019-10-24 DIAGNOSIS — Z7901 Long term (current) use of anticoagulants: Secondary | ICD-10-CM | POA: Diagnosis not present

## 2019-10-24 DIAGNOSIS — I480 Paroxysmal atrial fibrillation: Secondary | ICD-10-CM | POA: Diagnosis not present

## 2019-10-24 DIAGNOSIS — Z7189 Other specified counseling: Secondary | ICD-10-CM | POA: Diagnosis not present

## 2019-10-31 DIAGNOSIS — Z23 Encounter for immunization: Secondary | ICD-10-CM | POA: Diagnosis not present

## 2019-11-01 DIAGNOSIS — E781 Pure hyperglyceridemia: Secondary | ICD-10-CM | POA: Diagnosis not present

## 2019-11-01 DIAGNOSIS — C250 Malignant neoplasm of head of pancreas: Secondary | ICD-10-CM | POA: Diagnosis not present

## 2019-11-01 DIAGNOSIS — F331 Major depressive disorder, recurrent, moderate: Secondary | ICD-10-CM | POA: Diagnosis not present

## 2019-11-01 DIAGNOSIS — I2510 Atherosclerotic heart disease of native coronary artery without angina pectoris: Secondary | ICD-10-CM | POA: Diagnosis not present

## 2019-11-01 DIAGNOSIS — N401 Enlarged prostate with lower urinary tract symptoms: Secondary | ICD-10-CM | POA: Diagnosis not present

## 2019-11-01 DIAGNOSIS — I712 Thoracic aortic aneurysm, without rupture: Secondary | ICD-10-CM | POA: Diagnosis not present

## 2019-11-01 DIAGNOSIS — E039 Hypothyroidism, unspecified: Secondary | ICD-10-CM | POA: Diagnosis not present

## 2019-11-01 DIAGNOSIS — M6281 Muscle weakness (generalized): Secondary | ICD-10-CM | POA: Diagnosis not present

## 2019-11-01 DIAGNOSIS — D6869 Other thrombophilia: Secondary | ICD-10-CM | POA: Diagnosis not present

## 2019-11-01 DIAGNOSIS — I10 Essential (primary) hypertension: Secondary | ICD-10-CM | POA: Diagnosis not present

## 2019-11-01 DIAGNOSIS — I4891 Unspecified atrial fibrillation: Secondary | ICD-10-CM | POA: Diagnosis not present

## 2019-11-02 DIAGNOSIS — E039 Hypothyroidism, unspecified: Secondary | ICD-10-CM | POA: Diagnosis not present

## 2019-11-02 DIAGNOSIS — C250 Malignant neoplasm of head of pancreas: Secondary | ICD-10-CM | POA: Diagnosis not present

## 2019-11-02 DIAGNOSIS — E781 Pure hyperglyceridemia: Secondary | ICD-10-CM | POA: Diagnosis not present

## 2019-11-02 DIAGNOSIS — F331 Major depressive disorder, recurrent, moderate: Secondary | ICD-10-CM | POA: Diagnosis not present

## 2019-11-02 DIAGNOSIS — M6281 Muscle weakness (generalized): Secondary | ICD-10-CM | POA: Diagnosis not present

## 2019-11-02 DIAGNOSIS — I10 Essential (primary) hypertension: Secondary | ICD-10-CM | POA: Diagnosis not present

## 2019-11-06 DIAGNOSIS — C250 Malignant neoplasm of head of pancreas: Secondary | ICD-10-CM | POA: Diagnosis not present

## 2019-11-06 DIAGNOSIS — E039 Hypothyroidism, unspecified: Secondary | ICD-10-CM | POA: Diagnosis not present

## 2019-11-06 DIAGNOSIS — M6281 Muscle weakness (generalized): Secondary | ICD-10-CM | POA: Diagnosis not present

## 2019-11-06 DIAGNOSIS — E781 Pure hyperglyceridemia: Secondary | ICD-10-CM | POA: Diagnosis not present

## 2019-11-06 DIAGNOSIS — F331 Major depressive disorder, recurrent, moderate: Secondary | ICD-10-CM | POA: Diagnosis not present

## 2019-11-06 DIAGNOSIS — I10 Essential (primary) hypertension: Secondary | ICD-10-CM | POA: Diagnosis not present

## 2019-11-09 DIAGNOSIS — F331 Major depressive disorder, recurrent, moderate: Secondary | ICD-10-CM | POA: Diagnosis not present

## 2019-11-09 DIAGNOSIS — I10 Essential (primary) hypertension: Secondary | ICD-10-CM | POA: Diagnosis not present

## 2019-11-09 DIAGNOSIS — C250 Malignant neoplasm of head of pancreas: Secondary | ICD-10-CM | POA: Diagnosis not present

## 2019-11-09 DIAGNOSIS — D6869 Other thrombophilia: Secondary | ICD-10-CM | POA: Diagnosis not present

## 2019-11-09 DIAGNOSIS — I712 Thoracic aortic aneurysm, without rupture: Secondary | ICD-10-CM | POA: Diagnosis not present

## 2019-11-09 DIAGNOSIS — N401 Enlarged prostate with lower urinary tract symptoms: Secondary | ICD-10-CM | POA: Diagnosis not present

## 2019-11-09 DIAGNOSIS — I2510 Atherosclerotic heart disease of native coronary artery without angina pectoris: Secondary | ICD-10-CM | POA: Diagnosis not present

## 2019-11-09 DIAGNOSIS — M6281 Muscle weakness (generalized): Secondary | ICD-10-CM | POA: Diagnosis not present

## 2019-11-09 DIAGNOSIS — E039 Hypothyroidism, unspecified: Secondary | ICD-10-CM | POA: Diagnosis not present

## 2019-11-09 DIAGNOSIS — E781 Pure hyperglyceridemia: Secondary | ICD-10-CM | POA: Diagnosis not present

## 2019-11-09 DIAGNOSIS — I4891 Unspecified atrial fibrillation: Secondary | ICD-10-CM | POA: Diagnosis not present

## 2019-11-16 DIAGNOSIS — I10 Essential (primary) hypertension: Secondary | ICD-10-CM | POA: Diagnosis not present

## 2019-11-16 DIAGNOSIS — C250 Malignant neoplasm of head of pancreas: Secondary | ICD-10-CM | POA: Diagnosis not present

## 2019-11-16 DIAGNOSIS — E781 Pure hyperglyceridemia: Secondary | ICD-10-CM | POA: Diagnosis not present

## 2019-11-16 DIAGNOSIS — E039 Hypothyroidism, unspecified: Secondary | ICD-10-CM | POA: Diagnosis not present

## 2019-11-16 DIAGNOSIS — F331 Major depressive disorder, recurrent, moderate: Secondary | ICD-10-CM | POA: Diagnosis not present

## 2019-11-16 DIAGNOSIS — M6281 Muscle weakness (generalized): Secondary | ICD-10-CM | POA: Diagnosis not present

## 2019-11-22 DIAGNOSIS — F331 Major depressive disorder, recurrent, moderate: Secondary | ICD-10-CM | POA: Diagnosis not present

## 2019-11-22 DIAGNOSIS — C250 Malignant neoplasm of head of pancreas: Secondary | ICD-10-CM | POA: Diagnosis not present

## 2019-11-22 DIAGNOSIS — I10 Essential (primary) hypertension: Secondary | ICD-10-CM | POA: Diagnosis not present

## 2019-11-22 DIAGNOSIS — E781 Pure hyperglyceridemia: Secondary | ICD-10-CM | POA: Diagnosis not present

## 2019-11-22 DIAGNOSIS — M6281 Muscle weakness (generalized): Secondary | ICD-10-CM | POA: Diagnosis not present

## 2019-11-22 DIAGNOSIS — E039 Hypothyroidism, unspecified: Secondary | ICD-10-CM | POA: Diagnosis not present

## 2019-11-29 DIAGNOSIS — E781 Pure hyperglyceridemia: Secondary | ICD-10-CM | POA: Diagnosis not present

## 2019-11-29 DIAGNOSIS — M6281 Muscle weakness (generalized): Secondary | ICD-10-CM | POA: Diagnosis not present

## 2019-11-29 DIAGNOSIS — I10 Essential (primary) hypertension: Secondary | ICD-10-CM | POA: Diagnosis not present

## 2019-11-29 DIAGNOSIS — C250 Malignant neoplasm of head of pancreas: Secondary | ICD-10-CM | POA: Diagnosis not present

## 2019-11-29 DIAGNOSIS — E039 Hypothyroidism, unspecified: Secondary | ICD-10-CM | POA: Diagnosis not present

## 2019-11-29 DIAGNOSIS — F331 Major depressive disorder, recurrent, moderate: Secondary | ICD-10-CM | POA: Diagnosis not present

## 2019-11-30 DIAGNOSIS — E039 Hypothyroidism, unspecified: Secondary | ICD-10-CM | POA: Diagnosis not present

## 2019-11-30 DIAGNOSIS — F331 Major depressive disorder, recurrent, moderate: Secondary | ICD-10-CM | POA: Diagnosis not present

## 2019-11-30 DIAGNOSIS — M6281 Muscle weakness (generalized): Secondary | ICD-10-CM | POA: Diagnosis not present

## 2019-11-30 DIAGNOSIS — I10 Essential (primary) hypertension: Secondary | ICD-10-CM | POA: Diagnosis not present

## 2019-11-30 DIAGNOSIS — E781 Pure hyperglyceridemia: Secondary | ICD-10-CM | POA: Diagnosis not present

## 2019-11-30 DIAGNOSIS — C250 Malignant neoplasm of head of pancreas: Secondary | ICD-10-CM | POA: Diagnosis not present

## 2019-12-04 ENCOUNTER — Telehealth: Payer: Self-pay | Admitting: Gastroenterology

## 2019-12-04 NOTE — Telephone Encounter (Signed)
The pt's daughter states that she needs a referral for her father to be seen at the mentioned GI near the pt's current home.  I advised her to have the new office request records and we would be glad to send any information needed.  The pt has been advised of the information and verbalized understanding.

## 2019-12-04 NOTE — Telephone Encounter (Signed)
Pt's daughter Virl Cagey called asking if Dr. Ardis Hughs could refer his father to Dr. Tharon Aquas, a GI MD who lives closer to pt's residence. In other words, pt will be transferring care to Dr. Adella Nissen. If this is possible, pls fax referral to [phone].

## 2019-12-08 DIAGNOSIS — F331 Major depressive disorder, recurrent, moderate: Secondary | ICD-10-CM | POA: Diagnosis not present

## 2019-12-08 DIAGNOSIS — C250 Malignant neoplasm of head of pancreas: Secondary | ICD-10-CM | POA: Diagnosis not present

## 2019-12-08 DIAGNOSIS — E781 Pure hyperglyceridemia: Secondary | ICD-10-CM | POA: Diagnosis not present

## 2019-12-08 DIAGNOSIS — I10 Essential (primary) hypertension: Secondary | ICD-10-CM | POA: Diagnosis not present

## 2019-12-08 DIAGNOSIS — E039 Hypothyroidism, unspecified: Secondary | ICD-10-CM | POA: Diagnosis not present

## 2019-12-08 DIAGNOSIS — M6281 Muscle weakness (generalized): Secondary | ICD-10-CM | POA: Diagnosis not present

## 2019-12-09 DIAGNOSIS — C250 Malignant neoplasm of head of pancreas: Secondary | ICD-10-CM | POA: Diagnosis not present

## 2019-12-09 DIAGNOSIS — D6869 Other thrombophilia: Secondary | ICD-10-CM | POA: Diagnosis not present

## 2019-12-09 DIAGNOSIS — I712 Thoracic aortic aneurysm, without rupture: Secondary | ICD-10-CM | POA: Diagnosis not present

## 2019-12-09 DIAGNOSIS — N401 Enlarged prostate with lower urinary tract symptoms: Secondary | ICD-10-CM | POA: Diagnosis not present

## 2019-12-09 DIAGNOSIS — I4891 Unspecified atrial fibrillation: Secondary | ICD-10-CM | POA: Diagnosis not present

## 2019-12-09 DIAGNOSIS — F331 Major depressive disorder, recurrent, moderate: Secondary | ICD-10-CM | POA: Diagnosis not present

## 2019-12-09 DIAGNOSIS — M6281 Muscle weakness (generalized): Secondary | ICD-10-CM | POA: Diagnosis not present

## 2019-12-09 DIAGNOSIS — E781 Pure hyperglyceridemia: Secondary | ICD-10-CM | POA: Diagnosis not present

## 2019-12-09 DIAGNOSIS — I2510 Atherosclerotic heart disease of native coronary artery without angina pectoris: Secondary | ICD-10-CM | POA: Diagnosis not present

## 2019-12-09 DIAGNOSIS — I10 Essential (primary) hypertension: Secondary | ICD-10-CM | POA: Diagnosis not present

## 2019-12-09 DIAGNOSIS — E039 Hypothyroidism, unspecified: Secondary | ICD-10-CM | POA: Diagnosis not present

## 2019-12-11 DIAGNOSIS — D2239 Melanocytic nevi of other parts of face: Secondary | ICD-10-CM | POA: Diagnosis not present

## 2019-12-11 DIAGNOSIS — C4441 Basal cell carcinoma of skin of scalp and neck: Secondary | ICD-10-CM | POA: Diagnosis not present

## 2019-12-11 DIAGNOSIS — C44319 Basal cell carcinoma of skin of other parts of face: Secondary | ICD-10-CM | POA: Diagnosis not present

## 2019-12-11 DIAGNOSIS — C44311 Basal cell carcinoma of skin of nose: Secondary | ICD-10-CM | POA: Diagnosis not present

## 2019-12-11 DIAGNOSIS — L814 Other melanin hyperpigmentation: Secondary | ICD-10-CM | POA: Diagnosis not present

## 2019-12-13 DIAGNOSIS — C4441 Basal cell carcinoma of skin of scalp and neck: Secondary | ICD-10-CM | POA: Diagnosis not present

## 2019-12-13 DIAGNOSIS — C44329 Squamous cell carcinoma of skin of other parts of face: Secondary | ICD-10-CM | POA: Diagnosis not present

## 2019-12-15 DIAGNOSIS — M6281 Muscle weakness (generalized): Secondary | ICD-10-CM | POA: Diagnosis not present

## 2019-12-15 DIAGNOSIS — I10 Essential (primary) hypertension: Secondary | ICD-10-CM | POA: Diagnosis not present

## 2019-12-15 DIAGNOSIS — E039 Hypothyroidism, unspecified: Secondary | ICD-10-CM | POA: Diagnosis not present

## 2019-12-15 DIAGNOSIS — E781 Pure hyperglyceridemia: Secondary | ICD-10-CM | POA: Diagnosis not present

## 2019-12-15 DIAGNOSIS — C250 Malignant neoplasm of head of pancreas: Secondary | ICD-10-CM | POA: Diagnosis not present

## 2019-12-15 DIAGNOSIS — F331 Major depressive disorder, recurrent, moderate: Secondary | ICD-10-CM | POA: Diagnosis not present

## 2019-12-19 DIAGNOSIS — E039 Hypothyroidism, unspecified: Secondary | ICD-10-CM | POA: Diagnosis not present

## 2019-12-19 DIAGNOSIS — C250 Malignant neoplasm of head of pancreas: Secondary | ICD-10-CM | POA: Diagnosis not present

## 2019-12-19 DIAGNOSIS — E781 Pure hyperglyceridemia: Secondary | ICD-10-CM | POA: Diagnosis not present

## 2019-12-19 DIAGNOSIS — M6281 Muscle weakness (generalized): Secondary | ICD-10-CM | POA: Diagnosis not present

## 2019-12-19 DIAGNOSIS — I10 Essential (primary) hypertension: Secondary | ICD-10-CM | POA: Diagnosis not present

## 2019-12-19 DIAGNOSIS — F331 Major depressive disorder, recurrent, moderate: Secondary | ICD-10-CM | POA: Diagnosis not present

## 2019-12-26 DIAGNOSIS — F331 Major depressive disorder, recurrent, moderate: Secondary | ICD-10-CM | POA: Diagnosis not present

## 2019-12-26 DIAGNOSIS — E781 Pure hyperglyceridemia: Secondary | ICD-10-CM | POA: Diagnosis not present

## 2019-12-26 DIAGNOSIS — C250 Malignant neoplasm of head of pancreas: Secondary | ICD-10-CM | POA: Diagnosis not present

## 2019-12-26 DIAGNOSIS — I10 Essential (primary) hypertension: Secondary | ICD-10-CM | POA: Diagnosis not present

## 2019-12-26 DIAGNOSIS — M6281 Muscle weakness (generalized): Secondary | ICD-10-CM | POA: Diagnosis not present

## 2019-12-26 DIAGNOSIS — E039 Hypothyroidism, unspecified: Secondary | ICD-10-CM | POA: Diagnosis not present

## 2019-12-28 DIAGNOSIS — E039 Hypothyroidism, unspecified: Secondary | ICD-10-CM | POA: Diagnosis not present

## 2019-12-28 DIAGNOSIS — E781 Pure hyperglyceridemia: Secondary | ICD-10-CM | POA: Diagnosis not present

## 2019-12-28 DIAGNOSIS — M6281 Muscle weakness (generalized): Secondary | ICD-10-CM | POA: Diagnosis not present

## 2019-12-28 DIAGNOSIS — F331 Major depressive disorder, recurrent, moderate: Secondary | ICD-10-CM | POA: Diagnosis not present

## 2019-12-28 DIAGNOSIS — C250 Malignant neoplasm of head of pancreas: Secondary | ICD-10-CM | POA: Diagnosis not present

## 2019-12-28 DIAGNOSIS — I10 Essential (primary) hypertension: Secondary | ICD-10-CM | POA: Diagnosis not present

## 2019-12-31 DIAGNOSIS — I10 Essential (primary) hypertension: Secondary | ICD-10-CM | POA: Diagnosis not present

## 2019-12-31 DIAGNOSIS — F331 Major depressive disorder, recurrent, moderate: Secondary | ICD-10-CM | POA: Diagnosis not present

## 2019-12-31 DIAGNOSIS — E781 Pure hyperglyceridemia: Secondary | ICD-10-CM | POA: Diagnosis not present

## 2019-12-31 DIAGNOSIS — E039 Hypothyroidism, unspecified: Secondary | ICD-10-CM | POA: Diagnosis not present

## 2019-12-31 DIAGNOSIS — C250 Malignant neoplasm of head of pancreas: Secondary | ICD-10-CM | POA: Diagnosis not present

## 2019-12-31 DIAGNOSIS — M6281 Muscle weakness (generalized): Secondary | ICD-10-CM | POA: Diagnosis not present

## 2020-01-01 DIAGNOSIS — F331 Major depressive disorder, recurrent, moderate: Secondary | ICD-10-CM | POA: Diagnosis not present

## 2020-01-01 DIAGNOSIS — M6281 Muscle weakness (generalized): Secondary | ICD-10-CM | POA: Diagnosis not present

## 2020-01-01 DIAGNOSIS — I10 Essential (primary) hypertension: Secondary | ICD-10-CM | POA: Diagnosis not present

## 2020-01-01 DIAGNOSIS — C250 Malignant neoplasm of head of pancreas: Secondary | ICD-10-CM | POA: Diagnosis not present

## 2020-01-01 DIAGNOSIS — E781 Pure hyperglyceridemia: Secondary | ICD-10-CM | POA: Diagnosis not present

## 2020-01-01 DIAGNOSIS — E039 Hypothyroidism, unspecified: Secondary | ICD-10-CM | POA: Diagnosis not present

## 2020-02-08 DIAGNOSIS — I4891 Unspecified atrial fibrillation: Secondary | ICD-10-CM | POA: Diagnosis not present

## 2020-02-08 DIAGNOSIS — N401 Enlarged prostate with lower urinary tract symptoms: Secondary | ICD-10-CM | POA: Diagnosis not present

## 2020-02-08 DIAGNOSIS — I2510 Atherosclerotic heart disease of native coronary artery without angina pectoris: Secondary | ICD-10-CM | POA: Diagnosis not present

## 2020-02-08 DIAGNOSIS — E781 Pure hyperglyceridemia: Secondary | ICD-10-CM | POA: Diagnosis not present

## 2020-02-08 DIAGNOSIS — I10 Essential (primary) hypertension: Secondary | ICD-10-CM | POA: Diagnosis not present

## 2020-02-08 DIAGNOSIS — E039 Hypothyroidism, unspecified: Secondary | ICD-10-CM | POA: Diagnosis not present

## 2020-02-08 DIAGNOSIS — F331 Major depressive disorder, recurrent, moderate: Secondary | ICD-10-CM | POA: Diagnosis not present

## 2020-02-08 DIAGNOSIS — I712 Thoracic aortic aneurysm, without rupture: Secondary | ICD-10-CM | POA: Diagnosis not present

## 2020-02-08 DIAGNOSIS — M6281 Muscle weakness (generalized): Secondary | ICD-10-CM | POA: Diagnosis not present

## 2020-02-08 DIAGNOSIS — C250 Malignant neoplasm of head of pancreas: Secondary | ICD-10-CM | POA: Diagnosis not present

## 2020-02-08 DIAGNOSIS — D6869 Other thrombophilia: Secondary | ICD-10-CM | POA: Diagnosis not present

## 2020-02-15 DIAGNOSIS — C250 Malignant neoplasm of head of pancreas: Secondary | ICD-10-CM | POA: Diagnosis not present

## 2020-02-15 DIAGNOSIS — E781 Pure hyperglyceridemia: Secondary | ICD-10-CM | POA: Diagnosis not present

## 2020-02-15 DIAGNOSIS — F331 Major depressive disorder, recurrent, moderate: Secondary | ICD-10-CM | POA: Diagnosis not present

## 2020-02-15 DIAGNOSIS — E039 Hypothyroidism, unspecified: Secondary | ICD-10-CM | POA: Diagnosis not present

## 2020-02-15 DIAGNOSIS — I10 Essential (primary) hypertension: Secondary | ICD-10-CM | POA: Diagnosis not present

## 2020-02-15 DIAGNOSIS — M6281 Muscle weakness (generalized): Secondary | ICD-10-CM | POA: Diagnosis not present

## 2020-02-19 DIAGNOSIS — I10 Essential (primary) hypertension: Secondary | ICD-10-CM | POA: Diagnosis not present

## 2020-02-19 DIAGNOSIS — R6883 Chills (without fever): Secondary | ICD-10-CM | POA: Diagnosis not present

## 2020-02-19 DIAGNOSIS — Z20828 Contact with and (suspected) exposure to other viral communicable diseases: Secondary | ICD-10-CM | POA: Diagnosis not present

## 2020-02-19 DIAGNOSIS — D6869 Other thrombophilia: Secondary | ICD-10-CM | POA: Diagnosis not present

## 2020-02-21 DIAGNOSIS — E039 Hypothyroidism, unspecified: Secondary | ICD-10-CM | POA: Diagnosis not present

## 2020-02-21 DIAGNOSIS — E781 Pure hyperglyceridemia: Secondary | ICD-10-CM | POA: Diagnosis not present

## 2020-02-21 DIAGNOSIS — F331 Major depressive disorder, recurrent, moderate: Secondary | ICD-10-CM | POA: Diagnosis not present

## 2020-02-21 DIAGNOSIS — I10 Essential (primary) hypertension: Secondary | ICD-10-CM | POA: Diagnosis not present

## 2020-02-21 DIAGNOSIS — C250 Malignant neoplasm of head of pancreas: Secondary | ICD-10-CM | POA: Diagnosis not present

## 2020-02-21 DIAGNOSIS — M6281 Muscle weakness (generalized): Secondary | ICD-10-CM | POA: Diagnosis not present

## 2020-02-22 DIAGNOSIS — Z1211 Encounter for screening for malignant neoplasm of colon: Secondary | ICD-10-CM | POA: Diagnosis not present

## 2020-02-23 DIAGNOSIS — J321 Chronic frontal sinusitis: Secondary | ICD-10-CM | POA: Diagnosis not present

## 2020-02-28 DIAGNOSIS — F331 Major depressive disorder, recurrent, moderate: Secondary | ICD-10-CM | POA: Diagnosis not present

## 2020-02-28 DIAGNOSIS — E876 Hypokalemia: Secondary | ICD-10-CM | POA: Diagnosis not present

## 2020-02-28 DIAGNOSIS — C250 Malignant neoplasm of head of pancreas: Secondary | ICD-10-CM | POA: Diagnosis not present

## 2020-02-28 DIAGNOSIS — E781 Pure hyperglyceridemia: Secondary | ICD-10-CM | POA: Diagnosis not present

## 2020-02-28 DIAGNOSIS — I10 Essential (primary) hypertension: Secondary | ICD-10-CM | POA: Diagnosis not present

## 2020-02-28 DIAGNOSIS — M6281 Muscle weakness (generalized): Secondary | ICD-10-CM | POA: Diagnosis not present

## 2020-02-28 DIAGNOSIS — E039 Hypothyroidism, unspecified: Secondary | ICD-10-CM | POA: Diagnosis not present

## 2020-03-04 DIAGNOSIS — E039 Hypothyroidism, unspecified: Secondary | ICD-10-CM | POA: Diagnosis not present

## 2020-03-04 DIAGNOSIS — I10 Essential (primary) hypertension: Secondary | ICD-10-CM | POA: Diagnosis not present

## 2020-03-04 DIAGNOSIS — F331 Major depressive disorder, recurrent, moderate: Secondary | ICD-10-CM | POA: Diagnosis not present

## 2020-03-04 DIAGNOSIS — E781 Pure hyperglyceridemia: Secondary | ICD-10-CM | POA: Diagnosis not present

## 2020-03-04 DIAGNOSIS — C250 Malignant neoplasm of head of pancreas: Secondary | ICD-10-CM | POA: Diagnosis not present

## 2020-03-04 DIAGNOSIS — M6281 Muscle weakness (generalized): Secondary | ICD-10-CM | POA: Diagnosis not present

## 2020-03-10 DIAGNOSIS — I10 Essential (primary) hypertension: Secondary | ICD-10-CM | POA: Diagnosis not present

## 2020-03-10 DIAGNOSIS — F331 Major depressive disorder, recurrent, moderate: Secondary | ICD-10-CM | POA: Diagnosis not present

## 2020-03-10 DIAGNOSIS — M6281 Muscle weakness (generalized): Secondary | ICD-10-CM | POA: Diagnosis not present

## 2020-03-10 DIAGNOSIS — I4891 Unspecified atrial fibrillation: Secondary | ICD-10-CM | POA: Diagnosis not present

## 2020-03-10 DIAGNOSIS — E781 Pure hyperglyceridemia: Secondary | ICD-10-CM | POA: Diagnosis not present

## 2020-03-10 DIAGNOSIS — D6869 Other thrombophilia: Secondary | ICD-10-CM | POA: Diagnosis not present

## 2020-03-10 DIAGNOSIS — I2510 Atherosclerotic heart disease of native coronary artery without angina pectoris: Secondary | ICD-10-CM | POA: Diagnosis not present

## 2020-03-10 DIAGNOSIS — N401 Enlarged prostate with lower urinary tract symptoms: Secondary | ICD-10-CM | POA: Diagnosis not present

## 2020-03-10 DIAGNOSIS — E039 Hypothyroidism, unspecified: Secondary | ICD-10-CM | POA: Diagnosis not present

## 2020-03-10 DIAGNOSIS — C250 Malignant neoplasm of head of pancreas: Secondary | ICD-10-CM | POA: Diagnosis not present

## 2020-03-10 DIAGNOSIS — I712 Thoracic aortic aneurysm, without rupture: Secondary | ICD-10-CM | POA: Diagnosis not present

## 2020-03-13 DIAGNOSIS — E781 Pure hyperglyceridemia: Secondary | ICD-10-CM | POA: Diagnosis not present

## 2020-03-13 DIAGNOSIS — M6281 Muscle weakness (generalized): Secondary | ICD-10-CM | POA: Diagnosis not present

## 2020-03-13 DIAGNOSIS — C250 Malignant neoplasm of head of pancreas: Secondary | ICD-10-CM | POA: Diagnosis not present

## 2020-03-13 DIAGNOSIS — F331 Major depressive disorder, recurrent, moderate: Secondary | ICD-10-CM | POA: Diagnosis not present

## 2020-03-13 DIAGNOSIS — E039 Hypothyroidism, unspecified: Secondary | ICD-10-CM | POA: Diagnosis not present

## 2020-03-13 DIAGNOSIS — I10 Essential (primary) hypertension: Secondary | ICD-10-CM | POA: Diagnosis not present

## 2020-03-19 DIAGNOSIS — E039 Hypothyroidism, unspecified: Secondary | ICD-10-CM | POA: Diagnosis not present

## 2020-03-19 DIAGNOSIS — I10 Essential (primary) hypertension: Secondary | ICD-10-CM | POA: Diagnosis not present

## 2020-03-19 DIAGNOSIS — C250 Malignant neoplasm of head of pancreas: Secondary | ICD-10-CM | POA: Diagnosis not present

## 2020-03-19 DIAGNOSIS — M6281 Muscle weakness (generalized): Secondary | ICD-10-CM | POA: Diagnosis not present

## 2020-03-19 DIAGNOSIS — E781 Pure hyperglyceridemia: Secondary | ICD-10-CM | POA: Diagnosis not present

## 2020-03-19 DIAGNOSIS — F331 Major depressive disorder, recurrent, moderate: Secondary | ICD-10-CM | POA: Diagnosis not present

## 2020-03-27 DIAGNOSIS — E781 Pure hyperglyceridemia: Secondary | ICD-10-CM | POA: Diagnosis not present

## 2020-03-27 DIAGNOSIS — E039 Hypothyroidism, unspecified: Secondary | ICD-10-CM | POA: Diagnosis not present

## 2020-03-27 DIAGNOSIS — I10 Essential (primary) hypertension: Secondary | ICD-10-CM | POA: Diagnosis not present

## 2020-03-27 DIAGNOSIS — M6281 Muscle weakness (generalized): Secondary | ICD-10-CM | POA: Diagnosis not present

## 2020-03-27 DIAGNOSIS — C250 Malignant neoplasm of head of pancreas: Secondary | ICD-10-CM | POA: Diagnosis not present

## 2020-03-27 DIAGNOSIS — F331 Major depressive disorder, recurrent, moderate: Secondary | ICD-10-CM | POA: Diagnosis not present

## 2020-03-28 DIAGNOSIS — E039 Hypothyroidism, unspecified: Secondary | ICD-10-CM | POA: Diagnosis not present

## 2020-03-28 DIAGNOSIS — E781 Pure hyperglyceridemia: Secondary | ICD-10-CM | POA: Diagnosis not present

## 2020-03-28 DIAGNOSIS — F331 Major depressive disorder, recurrent, moderate: Secondary | ICD-10-CM | POA: Diagnosis not present

## 2020-03-28 DIAGNOSIS — C250 Malignant neoplasm of head of pancreas: Secondary | ICD-10-CM | POA: Diagnosis not present

## 2020-03-28 DIAGNOSIS — I10 Essential (primary) hypertension: Secondary | ICD-10-CM | POA: Diagnosis not present

## 2020-03-28 DIAGNOSIS — M6281 Muscle weakness (generalized): Secondary | ICD-10-CM | POA: Diagnosis not present

## 2020-04-02 DIAGNOSIS — C250 Malignant neoplasm of head of pancreas: Secondary | ICD-10-CM | POA: Diagnosis not present

## 2020-04-02 DIAGNOSIS — I10 Essential (primary) hypertension: Secondary | ICD-10-CM | POA: Diagnosis not present

## 2020-04-02 DIAGNOSIS — F331 Major depressive disorder, recurrent, moderate: Secondary | ICD-10-CM | POA: Diagnosis not present

## 2020-04-02 DIAGNOSIS — E781 Pure hyperglyceridemia: Secondary | ICD-10-CM | POA: Diagnosis not present

## 2020-04-02 DIAGNOSIS — E039 Hypothyroidism, unspecified: Secondary | ICD-10-CM | POA: Diagnosis not present

## 2020-04-02 DIAGNOSIS — M6281 Muscle weakness (generalized): Secondary | ICD-10-CM | POA: Diagnosis not present

## 2020-04-10 DIAGNOSIS — I4891 Unspecified atrial fibrillation: Secondary | ICD-10-CM | POA: Diagnosis not present

## 2020-04-10 DIAGNOSIS — E781 Pure hyperglyceridemia: Secondary | ICD-10-CM | POA: Diagnosis not present

## 2020-04-10 DIAGNOSIS — I10 Essential (primary) hypertension: Secondary | ICD-10-CM | POA: Diagnosis not present

## 2020-04-10 DIAGNOSIS — D6869 Other thrombophilia: Secondary | ICD-10-CM | POA: Diagnosis not present

## 2020-04-10 DIAGNOSIS — F331 Major depressive disorder, recurrent, moderate: Secondary | ICD-10-CM | POA: Diagnosis not present

## 2020-04-10 DIAGNOSIS — C250 Malignant neoplasm of head of pancreas: Secondary | ICD-10-CM | POA: Diagnosis not present

## 2020-04-10 DIAGNOSIS — E039 Hypothyroidism, unspecified: Secondary | ICD-10-CM | POA: Diagnosis not present

## 2020-04-10 DIAGNOSIS — I712 Thoracic aortic aneurysm, without rupture: Secondary | ICD-10-CM | POA: Diagnosis not present

## 2020-04-10 DIAGNOSIS — I2510 Atherosclerotic heart disease of native coronary artery without angina pectoris: Secondary | ICD-10-CM | POA: Diagnosis not present

## 2020-04-10 DIAGNOSIS — M6281 Muscle weakness (generalized): Secondary | ICD-10-CM | POA: Diagnosis not present

## 2020-04-10 DIAGNOSIS — N401 Enlarged prostate with lower urinary tract symptoms: Secondary | ICD-10-CM | POA: Diagnosis not present

## 2020-04-18 DIAGNOSIS — M6281 Muscle weakness (generalized): Secondary | ICD-10-CM | POA: Diagnosis not present

## 2020-04-18 DIAGNOSIS — E039 Hypothyroidism, unspecified: Secondary | ICD-10-CM | POA: Diagnosis not present

## 2020-04-18 DIAGNOSIS — E781 Pure hyperglyceridemia: Secondary | ICD-10-CM | POA: Diagnosis not present

## 2020-04-18 DIAGNOSIS — I10 Essential (primary) hypertension: Secondary | ICD-10-CM | POA: Diagnosis not present

## 2020-04-18 DIAGNOSIS — F331 Major depressive disorder, recurrent, moderate: Secondary | ICD-10-CM | POA: Diagnosis not present

## 2020-04-18 DIAGNOSIS — C250 Malignant neoplasm of head of pancreas: Secondary | ICD-10-CM | POA: Diagnosis not present

## 2020-04-22 DIAGNOSIS — I10 Essential (primary) hypertension: Secondary | ICD-10-CM | POA: Diagnosis not present

## 2020-04-22 DIAGNOSIS — F331 Major depressive disorder, recurrent, moderate: Secondary | ICD-10-CM | POA: Diagnosis not present

## 2020-04-22 DIAGNOSIS — C250 Malignant neoplasm of head of pancreas: Secondary | ICD-10-CM | POA: Diagnosis not present

## 2020-04-22 DIAGNOSIS — M6281 Muscle weakness (generalized): Secondary | ICD-10-CM | POA: Diagnosis not present

## 2020-04-22 DIAGNOSIS — E781 Pure hyperglyceridemia: Secondary | ICD-10-CM | POA: Diagnosis not present

## 2020-04-22 DIAGNOSIS — E039 Hypothyroidism, unspecified: Secondary | ICD-10-CM | POA: Diagnosis not present

## 2020-04-24 DIAGNOSIS — C250 Malignant neoplasm of head of pancreas: Secondary | ICD-10-CM | POA: Diagnosis not present

## 2020-04-24 DIAGNOSIS — E039 Hypothyroidism, unspecified: Secondary | ICD-10-CM | POA: Diagnosis not present

## 2020-04-24 DIAGNOSIS — I10 Essential (primary) hypertension: Secondary | ICD-10-CM | POA: Diagnosis not present

## 2020-04-24 DIAGNOSIS — M6281 Muscle weakness (generalized): Secondary | ICD-10-CM | POA: Diagnosis not present

## 2020-04-24 DIAGNOSIS — F331 Major depressive disorder, recurrent, moderate: Secondary | ICD-10-CM | POA: Diagnosis not present

## 2020-04-24 DIAGNOSIS — E781 Pure hyperglyceridemia: Secondary | ICD-10-CM | POA: Diagnosis not present

## 2020-05-01 DIAGNOSIS — M6281 Muscle weakness (generalized): Secondary | ICD-10-CM | POA: Diagnosis not present

## 2020-05-01 DIAGNOSIS — F331 Major depressive disorder, recurrent, moderate: Secondary | ICD-10-CM | POA: Diagnosis not present

## 2020-05-01 DIAGNOSIS — C250 Malignant neoplasm of head of pancreas: Secondary | ICD-10-CM | POA: Diagnosis not present

## 2020-05-01 DIAGNOSIS — E781 Pure hyperglyceridemia: Secondary | ICD-10-CM | POA: Diagnosis not present

## 2020-05-01 DIAGNOSIS — E039 Hypothyroidism, unspecified: Secondary | ICD-10-CM | POA: Diagnosis not present

## 2020-05-01 DIAGNOSIS — I10 Essential (primary) hypertension: Secondary | ICD-10-CM | POA: Diagnosis not present

## 2020-05-08 DIAGNOSIS — F331 Major depressive disorder, recurrent, moderate: Secondary | ICD-10-CM | POA: Diagnosis not present

## 2020-05-08 DIAGNOSIS — E781 Pure hyperglyceridemia: Secondary | ICD-10-CM | POA: Diagnosis not present

## 2020-05-08 DIAGNOSIS — C250 Malignant neoplasm of head of pancreas: Secondary | ICD-10-CM | POA: Diagnosis not present

## 2020-05-08 DIAGNOSIS — E039 Hypothyroidism, unspecified: Secondary | ICD-10-CM | POA: Diagnosis not present

## 2020-05-08 DIAGNOSIS — I10 Essential (primary) hypertension: Secondary | ICD-10-CM | POA: Diagnosis not present

## 2020-05-08 DIAGNOSIS — M6281 Muscle weakness (generalized): Secondary | ICD-10-CM | POA: Diagnosis not present

## 2020-05-10 DIAGNOSIS — I2510 Atherosclerotic heart disease of native coronary artery without angina pectoris: Secondary | ICD-10-CM | POA: Diagnosis not present

## 2020-05-10 DIAGNOSIS — N401 Enlarged prostate with lower urinary tract symptoms: Secondary | ICD-10-CM | POA: Diagnosis not present

## 2020-05-10 DIAGNOSIS — C250 Malignant neoplasm of head of pancreas: Secondary | ICD-10-CM | POA: Diagnosis not present

## 2020-05-10 DIAGNOSIS — M6281 Muscle weakness (generalized): Secondary | ICD-10-CM | POA: Diagnosis not present

## 2020-05-10 DIAGNOSIS — I712 Thoracic aortic aneurysm, without rupture: Secondary | ICD-10-CM | POA: Diagnosis not present

## 2020-05-10 DIAGNOSIS — D6869 Other thrombophilia: Secondary | ICD-10-CM | POA: Diagnosis not present

## 2020-05-10 DIAGNOSIS — E781 Pure hyperglyceridemia: Secondary | ICD-10-CM | POA: Diagnosis not present

## 2020-05-10 DIAGNOSIS — E039 Hypothyroidism, unspecified: Secondary | ICD-10-CM | POA: Diagnosis not present

## 2020-05-10 DIAGNOSIS — I4891 Unspecified atrial fibrillation: Secondary | ICD-10-CM | POA: Diagnosis not present

## 2020-05-10 DIAGNOSIS — F331 Major depressive disorder, recurrent, moderate: Secondary | ICD-10-CM | POA: Diagnosis not present

## 2020-05-10 DIAGNOSIS — I10 Essential (primary) hypertension: Secondary | ICD-10-CM | POA: Diagnosis not present

## 2020-05-14 DIAGNOSIS — E781 Pure hyperglyceridemia: Secondary | ICD-10-CM | POA: Diagnosis not present

## 2020-05-14 DIAGNOSIS — F331 Major depressive disorder, recurrent, moderate: Secondary | ICD-10-CM | POA: Diagnosis not present

## 2020-05-14 DIAGNOSIS — M6281 Muscle weakness (generalized): Secondary | ICD-10-CM | POA: Diagnosis not present

## 2020-05-14 DIAGNOSIS — E039 Hypothyroidism, unspecified: Secondary | ICD-10-CM | POA: Diagnosis not present

## 2020-05-14 DIAGNOSIS — I10 Essential (primary) hypertension: Secondary | ICD-10-CM | POA: Diagnosis not present

## 2020-05-14 DIAGNOSIS — C250 Malignant neoplasm of head of pancreas: Secondary | ICD-10-CM | POA: Diagnosis not present

## 2020-05-16 DIAGNOSIS — I10 Essential (primary) hypertension: Secondary | ICD-10-CM | POA: Diagnosis not present

## 2020-05-16 DIAGNOSIS — C250 Malignant neoplasm of head of pancreas: Secondary | ICD-10-CM | POA: Diagnosis not present

## 2020-05-16 DIAGNOSIS — F331 Major depressive disorder, recurrent, moderate: Secondary | ICD-10-CM | POA: Diagnosis not present

## 2020-05-16 DIAGNOSIS — E039 Hypothyroidism, unspecified: Secondary | ICD-10-CM | POA: Diagnosis not present

## 2020-05-16 DIAGNOSIS — E781 Pure hyperglyceridemia: Secondary | ICD-10-CM | POA: Diagnosis not present

## 2020-05-16 DIAGNOSIS — M6281 Muscle weakness (generalized): Secondary | ICD-10-CM | POA: Diagnosis not present

## 2020-05-17 DIAGNOSIS — I10 Essential (primary) hypertension: Secondary | ICD-10-CM | POA: Diagnosis not present

## 2020-05-17 DIAGNOSIS — C250 Malignant neoplasm of head of pancreas: Secondary | ICD-10-CM | POA: Diagnosis not present

## 2020-05-17 DIAGNOSIS — F331 Major depressive disorder, recurrent, moderate: Secondary | ICD-10-CM | POA: Diagnosis not present

## 2020-05-17 DIAGNOSIS — E781 Pure hyperglyceridemia: Secondary | ICD-10-CM | POA: Diagnosis not present

## 2020-05-17 DIAGNOSIS — M6281 Muscle weakness (generalized): Secondary | ICD-10-CM | POA: Diagnosis not present

## 2020-05-17 DIAGNOSIS — E039 Hypothyroidism, unspecified: Secondary | ICD-10-CM | POA: Diagnosis not present

## 2020-05-21 DIAGNOSIS — I10 Essential (primary) hypertension: Secondary | ICD-10-CM | POA: Diagnosis not present

## 2020-05-21 DIAGNOSIS — E781 Pure hyperglyceridemia: Secondary | ICD-10-CM | POA: Diagnosis not present

## 2020-05-21 DIAGNOSIS — F331 Major depressive disorder, recurrent, moderate: Secondary | ICD-10-CM | POA: Diagnosis not present

## 2020-05-21 DIAGNOSIS — C250 Malignant neoplasm of head of pancreas: Secondary | ICD-10-CM | POA: Diagnosis not present

## 2020-05-21 DIAGNOSIS — M6281 Muscle weakness (generalized): Secondary | ICD-10-CM | POA: Diagnosis not present

## 2020-05-21 DIAGNOSIS — E039 Hypothyroidism, unspecified: Secondary | ICD-10-CM | POA: Diagnosis not present

## 2020-05-24 DIAGNOSIS — M6281 Muscle weakness (generalized): Secondary | ICD-10-CM | POA: Diagnosis not present

## 2020-05-24 DIAGNOSIS — C250 Malignant neoplasm of head of pancreas: Secondary | ICD-10-CM | POA: Diagnosis not present

## 2020-05-24 DIAGNOSIS — F331 Major depressive disorder, recurrent, moderate: Secondary | ICD-10-CM | POA: Diagnosis not present

## 2020-05-24 DIAGNOSIS — E039 Hypothyroidism, unspecified: Secondary | ICD-10-CM | POA: Diagnosis not present

## 2020-05-24 DIAGNOSIS — E781 Pure hyperglyceridemia: Secondary | ICD-10-CM | POA: Diagnosis not present

## 2020-05-24 DIAGNOSIS — I10 Essential (primary) hypertension: Secondary | ICD-10-CM | POA: Diagnosis not present

## 2020-05-28 DIAGNOSIS — F331 Major depressive disorder, recurrent, moderate: Secondary | ICD-10-CM | POA: Diagnosis not present

## 2020-05-28 DIAGNOSIS — E781 Pure hyperglyceridemia: Secondary | ICD-10-CM | POA: Diagnosis not present

## 2020-05-28 DIAGNOSIS — E039 Hypothyroidism, unspecified: Secondary | ICD-10-CM | POA: Diagnosis not present

## 2020-05-28 DIAGNOSIS — I10 Essential (primary) hypertension: Secondary | ICD-10-CM | POA: Diagnosis not present

## 2020-05-28 DIAGNOSIS — M6281 Muscle weakness (generalized): Secondary | ICD-10-CM | POA: Diagnosis not present

## 2020-05-28 DIAGNOSIS — C250 Malignant neoplasm of head of pancreas: Secondary | ICD-10-CM | POA: Diagnosis not present

## 2020-05-31 DIAGNOSIS — M6281 Muscle weakness (generalized): Secondary | ICD-10-CM | POA: Diagnosis not present

## 2020-05-31 DIAGNOSIS — E039 Hypothyroidism, unspecified: Secondary | ICD-10-CM | POA: Diagnosis not present

## 2020-05-31 DIAGNOSIS — C250 Malignant neoplasm of head of pancreas: Secondary | ICD-10-CM | POA: Diagnosis not present

## 2020-05-31 DIAGNOSIS — I10 Essential (primary) hypertension: Secondary | ICD-10-CM | POA: Diagnosis not present

## 2020-05-31 DIAGNOSIS — E781 Pure hyperglyceridemia: Secondary | ICD-10-CM | POA: Diagnosis not present

## 2020-05-31 DIAGNOSIS — F331 Major depressive disorder, recurrent, moderate: Secondary | ICD-10-CM | POA: Diagnosis not present

## 2020-06-04 DIAGNOSIS — M6281 Muscle weakness (generalized): Secondary | ICD-10-CM | POA: Diagnosis not present

## 2020-06-04 DIAGNOSIS — E781 Pure hyperglyceridemia: Secondary | ICD-10-CM | POA: Diagnosis not present

## 2020-06-04 DIAGNOSIS — C250 Malignant neoplasm of head of pancreas: Secondary | ICD-10-CM | POA: Diagnosis not present

## 2020-06-04 DIAGNOSIS — F331 Major depressive disorder, recurrent, moderate: Secondary | ICD-10-CM | POA: Diagnosis not present

## 2020-06-04 DIAGNOSIS — I10 Essential (primary) hypertension: Secondary | ICD-10-CM | POA: Diagnosis not present

## 2020-06-04 DIAGNOSIS — E039 Hypothyroidism, unspecified: Secondary | ICD-10-CM | POA: Diagnosis not present

## 2020-06-06 DIAGNOSIS — E039 Hypothyroidism, unspecified: Secondary | ICD-10-CM | POA: Diagnosis not present

## 2020-06-06 DIAGNOSIS — C250 Malignant neoplasm of head of pancreas: Secondary | ICD-10-CM | POA: Diagnosis not present

## 2020-06-06 DIAGNOSIS — F331 Major depressive disorder, recurrent, moderate: Secondary | ICD-10-CM | POA: Diagnosis not present

## 2020-06-06 DIAGNOSIS — E781 Pure hyperglyceridemia: Secondary | ICD-10-CM | POA: Diagnosis not present

## 2020-06-06 DIAGNOSIS — M6281 Muscle weakness (generalized): Secondary | ICD-10-CM | POA: Diagnosis not present

## 2020-06-06 DIAGNOSIS — I10 Essential (primary) hypertension: Secondary | ICD-10-CM | POA: Diagnosis not present

## 2020-06-10 DIAGNOSIS — E039 Hypothyroidism, unspecified: Secondary | ICD-10-CM | POA: Diagnosis not present

## 2020-06-10 DIAGNOSIS — I2510 Atherosclerotic heart disease of native coronary artery without angina pectoris: Secondary | ICD-10-CM | POA: Diagnosis not present

## 2020-06-10 DIAGNOSIS — F331 Major depressive disorder, recurrent, moderate: Secondary | ICD-10-CM | POA: Diagnosis not present

## 2020-06-10 DIAGNOSIS — I4891 Unspecified atrial fibrillation: Secondary | ICD-10-CM | POA: Diagnosis not present

## 2020-06-10 DIAGNOSIS — M6281 Muscle weakness (generalized): Secondary | ICD-10-CM | POA: Diagnosis not present

## 2020-06-10 DIAGNOSIS — I10 Essential (primary) hypertension: Secondary | ICD-10-CM | POA: Diagnosis not present

## 2020-06-10 DIAGNOSIS — C250 Malignant neoplasm of head of pancreas: Secondary | ICD-10-CM | POA: Diagnosis not present

## 2020-06-10 DIAGNOSIS — D6869 Other thrombophilia: Secondary | ICD-10-CM | POA: Diagnosis not present

## 2020-06-10 DIAGNOSIS — E781 Pure hyperglyceridemia: Secondary | ICD-10-CM | POA: Diagnosis not present

## 2020-06-10 DIAGNOSIS — N401 Enlarged prostate with lower urinary tract symptoms: Secondary | ICD-10-CM | POA: Diagnosis not present

## 2020-06-10 DIAGNOSIS — I712 Thoracic aortic aneurysm, without rupture: Secondary | ICD-10-CM | POA: Diagnosis not present

## 2020-06-11 DIAGNOSIS — I10 Essential (primary) hypertension: Secondary | ICD-10-CM | POA: Diagnosis not present

## 2020-06-11 DIAGNOSIS — E039 Hypothyroidism, unspecified: Secondary | ICD-10-CM | POA: Diagnosis not present

## 2020-06-11 DIAGNOSIS — C250 Malignant neoplasm of head of pancreas: Secondary | ICD-10-CM | POA: Diagnosis not present

## 2020-06-11 DIAGNOSIS — M6281 Muscle weakness (generalized): Secondary | ICD-10-CM | POA: Diagnosis not present

## 2020-06-11 DIAGNOSIS — E781 Pure hyperglyceridemia: Secondary | ICD-10-CM | POA: Diagnosis not present

## 2020-06-11 DIAGNOSIS — F331 Major depressive disorder, recurrent, moderate: Secondary | ICD-10-CM | POA: Diagnosis not present

## 2020-06-14 DIAGNOSIS — I10 Essential (primary) hypertension: Secondary | ICD-10-CM | POA: Diagnosis not present

## 2020-06-14 DIAGNOSIS — F331 Major depressive disorder, recurrent, moderate: Secondary | ICD-10-CM | POA: Diagnosis not present

## 2020-06-14 DIAGNOSIS — M6281 Muscle weakness (generalized): Secondary | ICD-10-CM | POA: Diagnosis not present

## 2020-06-14 DIAGNOSIS — C250 Malignant neoplasm of head of pancreas: Secondary | ICD-10-CM | POA: Diagnosis not present

## 2020-06-14 DIAGNOSIS — E781 Pure hyperglyceridemia: Secondary | ICD-10-CM | POA: Diagnosis not present

## 2020-06-14 DIAGNOSIS — E039 Hypothyroidism, unspecified: Secondary | ICD-10-CM | POA: Diagnosis not present

## 2020-06-18 DIAGNOSIS — F331 Major depressive disorder, recurrent, moderate: Secondary | ICD-10-CM | POA: Diagnosis not present

## 2020-06-18 DIAGNOSIS — E039 Hypothyroidism, unspecified: Secondary | ICD-10-CM | POA: Diagnosis not present

## 2020-06-18 DIAGNOSIS — M6281 Muscle weakness (generalized): Secondary | ICD-10-CM | POA: Diagnosis not present

## 2020-06-18 DIAGNOSIS — I10 Essential (primary) hypertension: Secondary | ICD-10-CM | POA: Diagnosis not present

## 2020-06-18 DIAGNOSIS — C250 Malignant neoplasm of head of pancreas: Secondary | ICD-10-CM | POA: Diagnosis not present

## 2020-06-18 DIAGNOSIS — E781 Pure hyperglyceridemia: Secondary | ICD-10-CM | POA: Diagnosis not present

## 2020-06-21 DIAGNOSIS — E781 Pure hyperglyceridemia: Secondary | ICD-10-CM | POA: Diagnosis not present

## 2020-06-21 DIAGNOSIS — F331 Major depressive disorder, recurrent, moderate: Secondary | ICD-10-CM | POA: Diagnosis not present

## 2020-06-21 DIAGNOSIS — E039 Hypothyroidism, unspecified: Secondary | ICD-10-CM | POA: Diagnosis not present

## 2020-06-21 DIAGNOSIS — I10 Essential (primary) hypertension: Secondary | ICD-10-CM | POA: Diagnosis not present

## 2020-06-21 DIAGNOSIS — C250 Malignant neoplasm of head of pancreas: Secondary | ICD-10-CM | POA: Diagnosis not present

## 2020-06-21 DIAGNOSIS — M6281 Muscle weakness (generalized): Secondary | ICD-10-CM | POA: Diagnosis not present

## 2020-06-25 DIAGNOSIS — I10 Essential (primary) hypertension: Secondary | ICD-10-CM | POA: Diagnosis not present

## 2020-06-25 DIAGNOSIS — E039 Hypothyroidism, unspecified: Secondary | ICD-10-CM | POA: Diagnosis not present

## 2020-06-25 DIAGNOSIS — E781 Pure hyperglyceridemia: Secondary | ICD-10-CM | POA: Diagnosis not present

## 2020-06-25 DIAGNOSIS — C250 Malignant neoplasm of head of pancreas: Secondary | ICD-10-CM | POA: Diagnosis not present

## 2020-06-25 DIAGNOSIS — M6281 Muscle weakness (generalized): Secondary | ICD-10-CM | POA: Diagnosis not present

## 2020-06-25 DIAGNOSIS — F331 Major depressive disorder, recurrent, moderate: Secondary | ICD-10-CM | POA: Diagnosis not present

## 2020-06-28 DIAGNOSIS — E781 Pure hyperglyceridemia: Secondary | ICD-10-CM | POA: Diagnosis not present

## 2020-06-28 DIAGNOSIS — F331 Major depressive disorder, recurrent, moderate: Secondary | ICD-10-CM | POA: Diagnosis not present

## 2020-06-28 DIAGNOSIS — M6281 Muscle weakness (generalized): Secondary | ICD-10-CM | POA: Diagnosis not present

## 2020-06-28 DIAGNOSIS — C250 Malignant neoplasm of head of pancreas: Secondary | ICD-10-CM | POA: Diagnosis not present

## 2020-06-28 DIAGNOSIS — I10 Essential (primary) hypertension: Secondary | ICD-10-CM | POA: Diagnosis not present

## 2020-06-28 DIAGNOSIS — E039 Hypothyroidism, unspecified: Secondary | ICD-10-CM | POA: Diagnosis not present

## 2020-07-02 DIAGNOSIS — E039 Hypothyroidism, unspecified: Secondary | ICD-10-CM | POA: Diagnosis not present

## 2020-07-02 DIAGNOSIS — I10 Essential (primary) hypertension: Secondary | ICD-10-CM | POA: Diagnosis not present

## 2020-07-02 DIAGNOSIS — F331 Major depressive disorder, recurrent, moderate: Secondary | ICD-10-CM | POA: Diagnosis not present

## 2020-07-02 DIAGNOSIS — M6281 Muscle weakness (generalized): Secondary | ICD-10-CM | POA: Diagnosis not present

## 2020-07-02 DIAGNOSIS — C250 Malignant neoplasm of head of pancreas: Secondary | ICD-10-CM | POA: Diagnosis not present

## 2020-07-02 DIAGNOSIS — E781 Pure hyperglyceridemia: Secondary | ICD-10-CM | POA: Diagnosis not present

## 2020-07-05 DIAGNOSIS — M6281 Muscle weakness (generalized): Secondary | ICD-10-CM | POA: Diagnosis not present

## 2020-07-05 DIAGNOSIS — I10 Essential (primary) hypertension: Secondary | ICD-10-CM | POA: Diagnosis not present

## 2020-07-05 DIAGNOSIS — E781 Pure hyperglyceridemia: Secondary | ICD-10-CM | POA: Diagnosis not present

## 2020-07-05 DIAGNOSIS — C250 Malignant neoplasm of head of pancreas: Secondary | ICD-10-CM | POA: Diagnosis not present

## 2020-07-05 DIAGNOSIS — F331 Major depressive disorder, recurrent, moderate: Secondary | ICD-10-CM | POA: Diagnosis not present

## 2020-07-05 DIAGNOSIS — E039 Hypothyroidism, unspecified: Secondary | ICD-10-CM | POA: Diagnosis not present

## 2020-07-09 DIAGNOSIS — E781 Pure hyperglyceridemia: Secondary | ICD-10-CM | POA: Diagnosis not present

## 2020-07-09 DIAGNOSIS — F331 Major depressive disorder, recurrent, moderate: Secondary | ICD-10-CM | POA: Diagnosis not present

## 2020-07-09 DIAGNOSIS — I10 Essential (primary) hypertension: Secondary | ICD-10-CM | POA: Diagnosis not present

## 2020-07-09 DIAGNOSIS — E039 Hypothyroidism, unspecified: Secondary | ICD-10-CM | POA: Diagnosis not present

## 2020-07-09 DIAGNOSIS — C250 Malignant neoplasm of head of pancreas: Secondary | ICD-10-CM | POA: Diagnosis not present

## 2020-07-09 DIAGNOSIS — M6281 Muscle weakness (generalized): Secondary | ICD-10-CM | POA: Diagnosis not present

## 2020-07-10 DIAGNOSIS — I2510 Atherosclerotic heart disease of native coronary artery without angina pectoris: Secondary | ICD-10-CM | POA: Diagnosis not present

## 2020-07-10 DIAGNOSIS — I10 Essential (primary) hypertension: Secondary | ICD-10-CM | POA: Diagnosis not present

## 2020-07-10 DIAGNOSIS — C250 Malignant neoplasm of head of pancreas: Secondary | ICD-10-CM | POA: Diagnosis not present

## 2020-07-10 DIAGNOSIS — E781 Pure hyperglyceridemia: Secondary | ICD-10-CM | POA: Diagnosis not present

## 2020-07-10 DIAGNOSIS — F331 Major depressive disorder, recurrent, moderate: Secondary | ICD-10-CM | POA: Diagnosis not present

## 2020-07-10 DIAGNOSIS — M6281 Muscle weakness (generalized): Secondary | ICD-10-CM | POA: Diagnosis not present

## 2020-07-10 DIAGNOSIS — D6869 Other thrombophilia: Secondary | ICD-10-CM | POA: Diagnosis not present

## 2020-07-10 DIAGNOSIS — I712 Thoracic aortic aneurysm, without rupture: Secondary | ICD-10-CM | POA: Diagnosis not present

## 2020-07-10 DIAGNOSIS — N401 Enlarged prostate with lower urinary tract symptoms: Secondary | ICD-10-CM | POA: Diagnosis not present

## 2020-07-10 DIAGNOSIS — E039 Hypothyroidism, unspecified: Secondary | ICD-10-CM | POA: Diagnosis not present

## 2020-07-10 DIAGNOSIS — I4891 Unspecified atrial fibrillation: Secondary | ICD-10-CM | POA: Diagnosis not present

## 2020-07-12 DIAGNOSIS — E039 Hypothyroidism, unspecified: Secondary | ICD-10-CM | POA: Diagnosis not present

## 2020-07-12 DIAGNOSIS — I10 Essential (primary) hypertension: Secondary | ICD-10-CM | POA: Diagnosis not present

## 2020-07-12 DIAGNOSIS — F331 Major depressive disorder, recurrent, moderate: Secondary | ICD-10-CM | POA: Diagnosis not present

## 2020-07-12 DIAGNOSIS — M6281 Muscle weakness (generalized): Secondary | ICD-10-CM | POA: Diagnosis not present

## 2020-07-12 DIAGNOSIS — C250 Malignant neoplasm of head of pancreas: Secondary | ICD-10-CM | POA: Diagnosis not present

## 2020-07-12 DIAGNOSIS — E781 Pure hyperglyceridemia: Secondary | ICD-10-CM | POA: Diagnosis not present

## 2020-07-16 DIAGNOSIS — I10 Essential (primary) hypertension: Secondary | ICD-10-CM | POA: Diagnosis not present

## 2020-07-16 DIAGNOSIS — E781 Pure hyperglyceridemia: Secondary | ICD-10-CM | POA: Diagnosis not present

## 2020-07-16 DIAGNOSIS — M6281 Muscle weakness (generalized): Secondary | ICD-10-CM | POA: Diagnosis not present

## 2020-07-16 DIAGNOSIS — E039 Hypothyroidism, unspecified: Secondary | ICD-10-CM | POA: Diagnosis not present

## 2020-07-16 DIAGNOSIS — C250 Malignant neoplasm of head of pancreas: Secondary | ICD-10-CM | POA: Diagnosis not present

## 2020-07-16 DIAGNOSIS — F331 Major depressive disorder, recurrent, moderate: Secondary | ICD-10-CM | POA: Diagnosis not present

## 2020-07-19 DIAGNOSIS — E039 Hypothyroidism, unspecified: Secondary | ICD-10-CM | POA: Diagnosis not present

## 2020-07-19 DIAGNOSIS — M6281 Muscle weakness (generalized): Secondary | ICD-10-CM | POA: Diagnosis not present

## 2020-07-19 DIAGNOSIS — F331 Major depressive disorder, recurrent, moderate: Secondary | ICD-10-CM | POA: Diagnosis not present

## 2020-07-19 DIAGNOSIS — I10 Essential (primary) hypertension: Secondary | ICD-10-CM | POA: Diagnosis not present

## 2020-07-19 DIAGNOSIS — C250 Malignant neoplasm of head of pancreas: Secondary | ICD-10-CM | POA: Diagnosis not present

## 2020-07-19 DIAGNOSIS — E781 Pure hyperglyceridemia: Secondary | ICD-10-CM | POA: Diagnosis not present

## 2020-07-23 DIAGNOSIS — E039 Hypothyroidism, unspecified: Secondary | ICD-10-CM | POA: Diagnosis not present

## 2020-07-23 DIAGNOSIS — F331 Major depressive disorder, recurrent, moderate: Secondary | ICD-10-CM | POA: Diagnosis not present

## 2020-07-23 DIAGNOSIS — I10 Essential (primary) hypertension: Secondary | ICD-10-CM | POA: Diagnosis not present

## 2020-07-23 DIAGNOSIS — M6281 Muscle weakness (generalized): Secondary | ICD-10-CM | POA: Diagnosis not present

## 2020-07-23 DIAGNOSIS — C250 Malignant neoplasm of head of pancreas: Secondary | ICD-10-CM | POA: Diagnosis not present

## 2020-07-23 DIAGNOSIS — E781 Pure hyperglyceridemia: Secondary | ICD-10-CM | POA: Diagnosis not present

## 2020-07-26 DIAGNOSIS — E781 Pure hyperglyceridemia: Secondary | ICD-10-CM | POA: Diagnosis not present

## 2020-07-26 DIAGNOSIS — F331 Major depressive disorder, recurrent, moderate: Secondary | ICD-10-CM | POA: Diagnosis not present

## 2020-07-26 DIAGNOSIS — C250 Malignant neoplasm of head of pancreas: Secondary | ICD-10-CM | POA: Diagnosis not present

## 2020-07-26 DIAGNOSIS — M6281 Muscle weakness (generalized): Secondary | ICD-10-CM | POA: Diagnosis not present

## 2020-07-26 DIAGNOSIS — E039 Hypothyroidism, unspecified: Secondary | ICD-10-CM | POA: Diagnosis not present

## 2020-07-26 DIAGNOSIS — I10 Essential (primary) hypertension: Secondary | ICD-10-CM | POA: Diagnosis not present

## 2020-07-30 DIAGNOSIS — E781 Pure hyperglyceridemia: Secondary | ICD-10-CM | POA: Diagnosis not present

## 2020-07-30 DIAGNOSIS — M6281 Muscle weakness (generalized): Secondary | ICD-10-CM | POA: Diagnosis not present

## 2020-07-30 DIAGNOSIS — C250 Malignant neoplasm of head of pancreas: Secondary | ICD-10-CM | POA: Diagnosis not present

## 2020-07-30 DIAGNOSIS — F331 Major depressive disorder, recurrent, moderate: Secondary | ICD-10-CM | POA: Diagnosis not present

## 2020-07-30 DIAGNOSIS — E039 Hypothyroidism, unspecified: Secondary | ICD-10-CM | POA: Diagnosis not present

## 2020-07-30 DIAGNOSIS — I10 Essential (primary) hypertension: Secondary | ICD-10-CM | POA: Diagnosis not present

## 2020-08-10 DEATH — deceased

## 2021-03-20 IMAGING — DX DG CHEST 1V PORT
1 series · 1 of 1 positions shown · non-contrast
Comparison: Radiographs 03/16/2019 and 09/06/2018.

CLINICAL DATA: Persistent fever since pancreatic stent placement 4
days ago.

EXAM:
PORTABLE CHEST 1 VIEW

[chest ap]
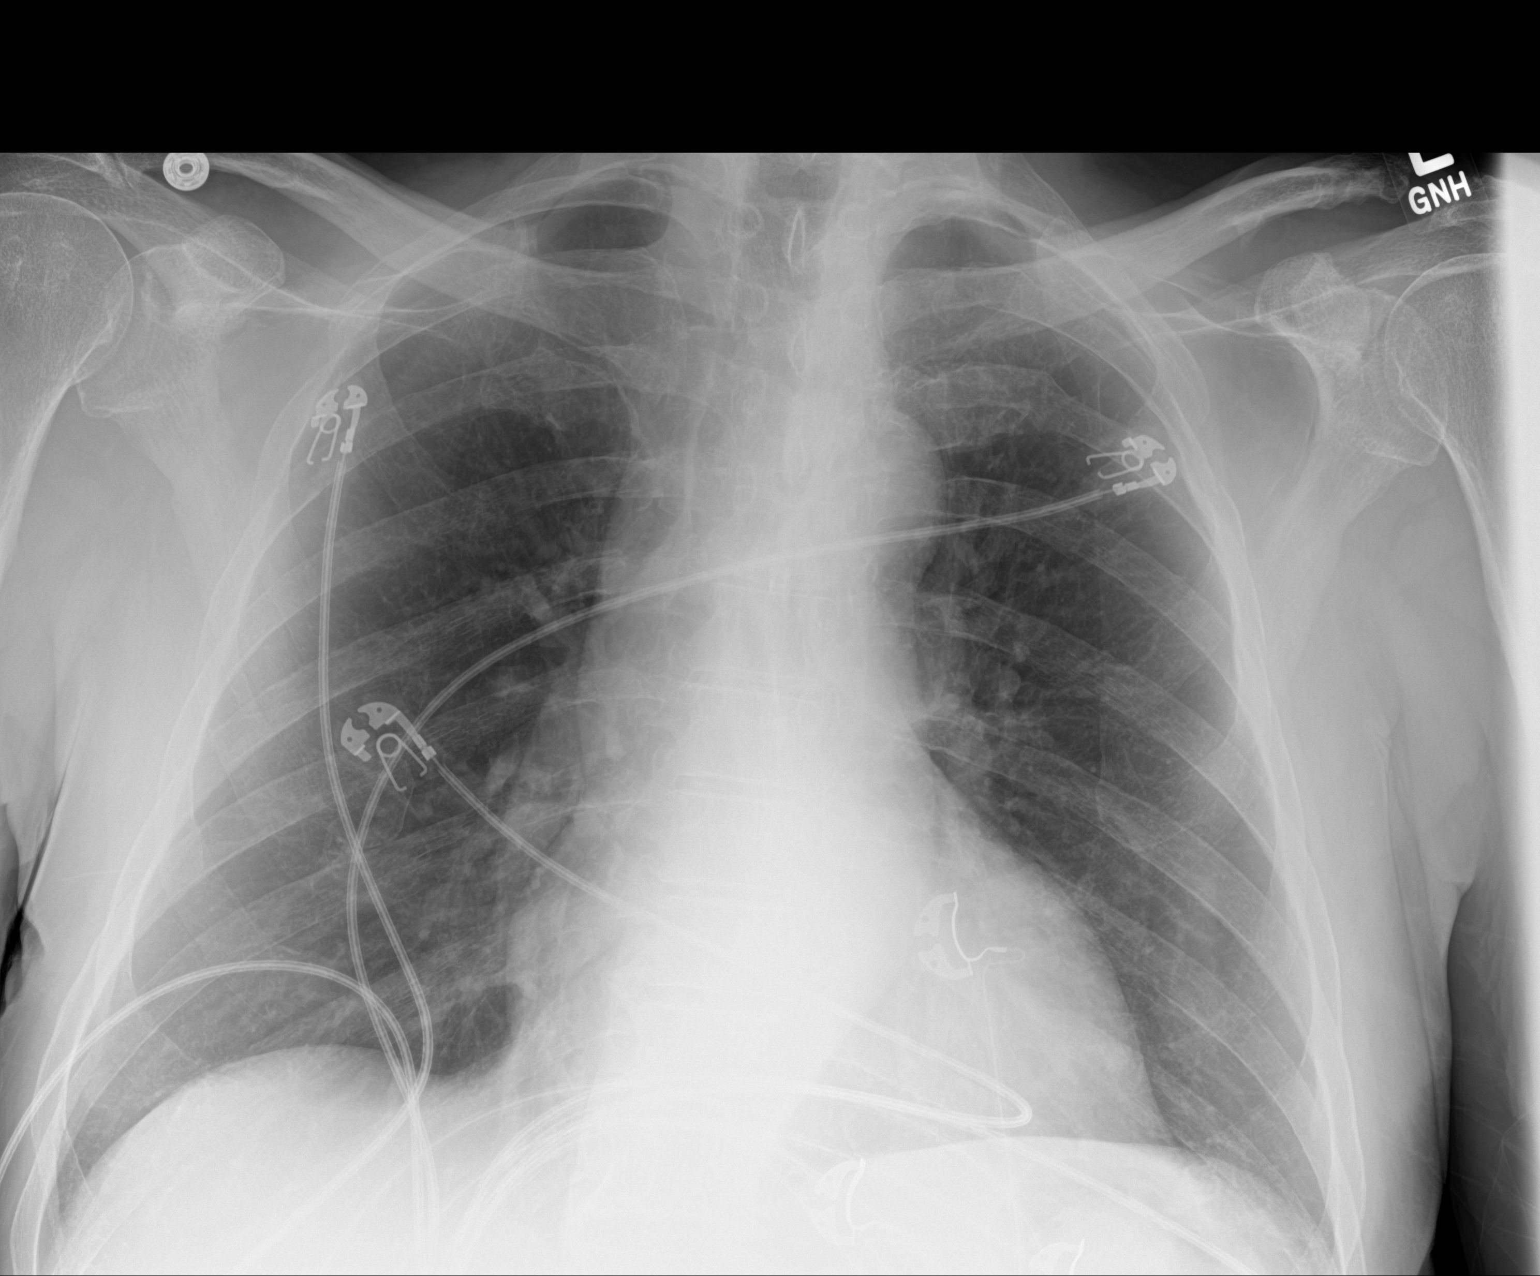

[1 of 1 positions shown; findings below may reference images not displayed]

FINDINGS: 7750 hours. The heart size and mediastinal contours are normal. The
lungs are clear. There is no pleural effusion or pneumothorax. No
acute osseous findings are identified. There are stable old
left-sided rib fractures. Telemetry leads overlie the chest.
IMPRESSION: Stable chest. No active cardiopulmonary process.

## 2021-03-21 IMAGING — CT CT CHEST W/ CM
2 of 4 series · 15 of 36 positions shown, 18 images · IV contrast (omnipaque)
Comparison: None.

CLINICAL DATA: New diagnosis of pancreatic cancer.  Staging.

EXAM:
CT CHEST WITH CONTRAST
TECHNIQUE: Multidetector CT imaging of the chest was performed during
intravenous contrast administration.
CONTRAST:  75mL OMNIPAQUE IOHEXOL 300 MG/ML  SOLN

[Series 2: axial st · axial · 0.75mm/px · z∈[-366,-54]mm · 12 of 183 slices shown, 15 images]
[im 14/183  mediastinal]
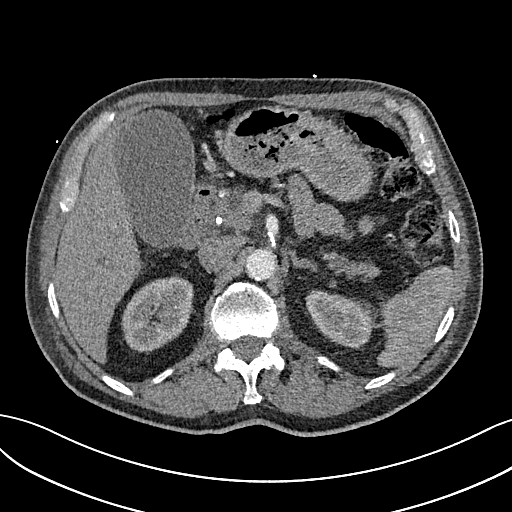
[im 14/183  lung]
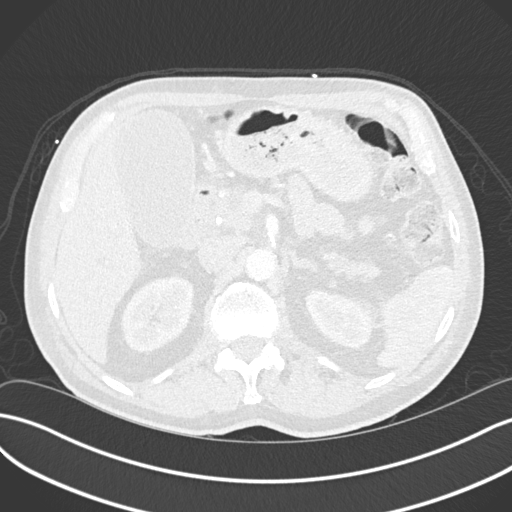
[im 27/183  lung]
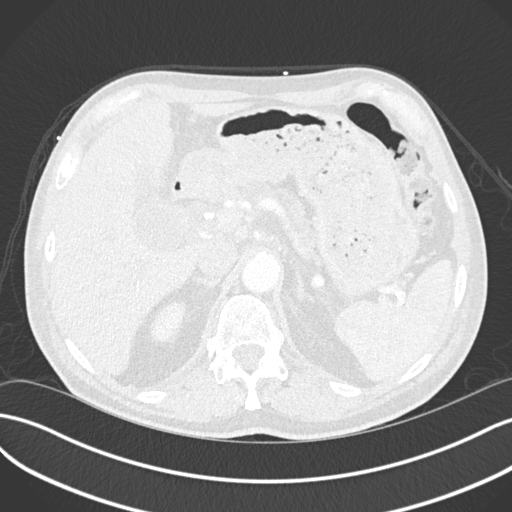
[im 40/183  lung]
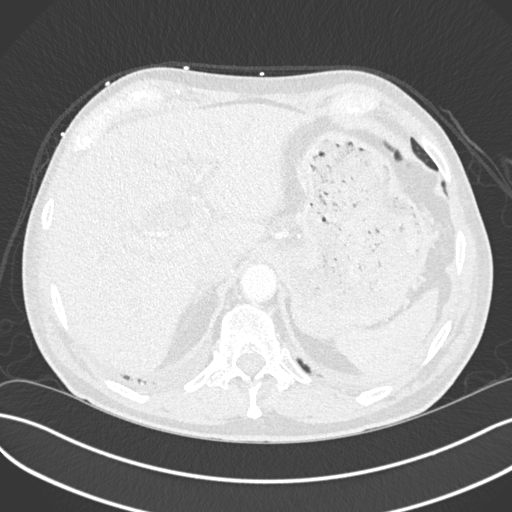
[im 53/183  lung]
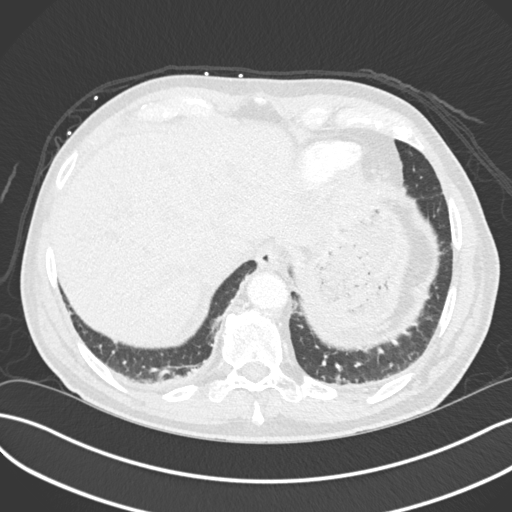
[im 66/183  mediastinal]
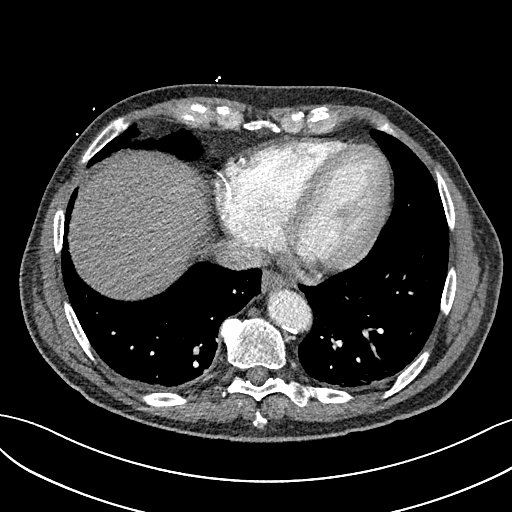
[im 66/183  lung]
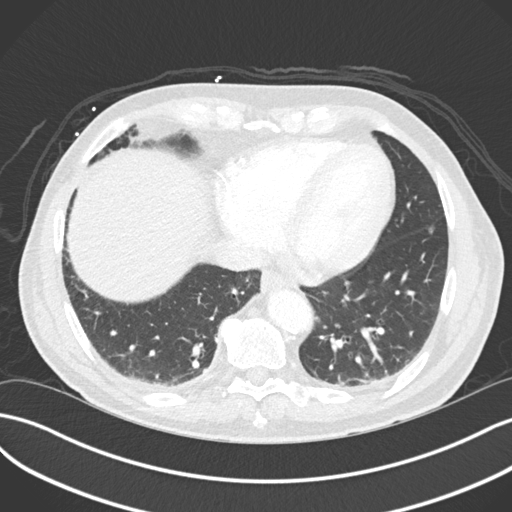
[im 79/183  lung]
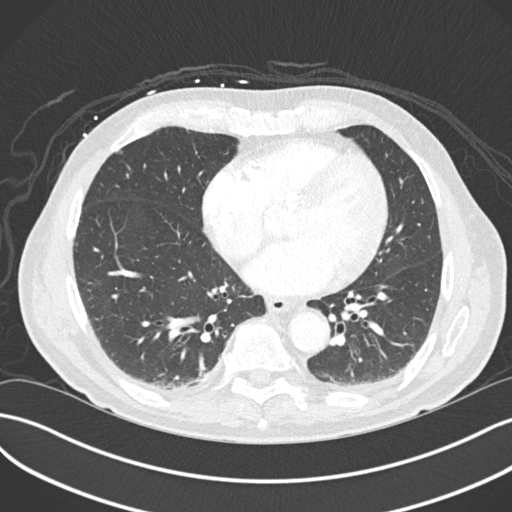
[im 105/183  lung]
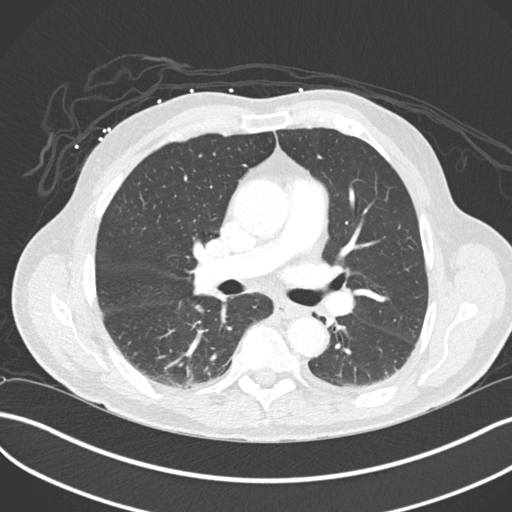
[im 118/183  lung]
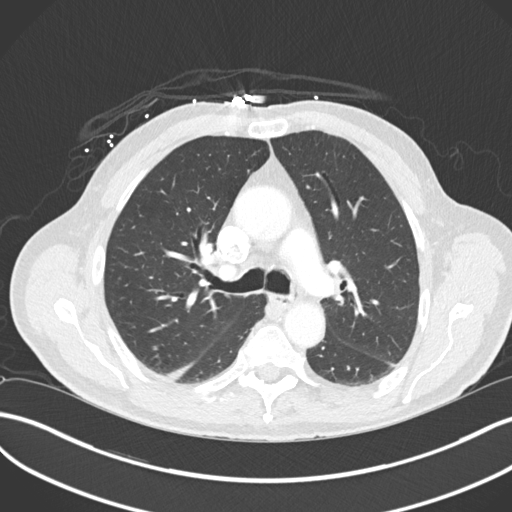
[im 131/183  mediastinal]
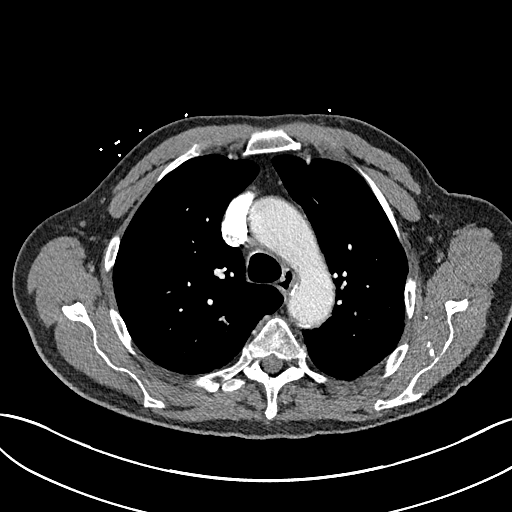
[im 131/183  lung]
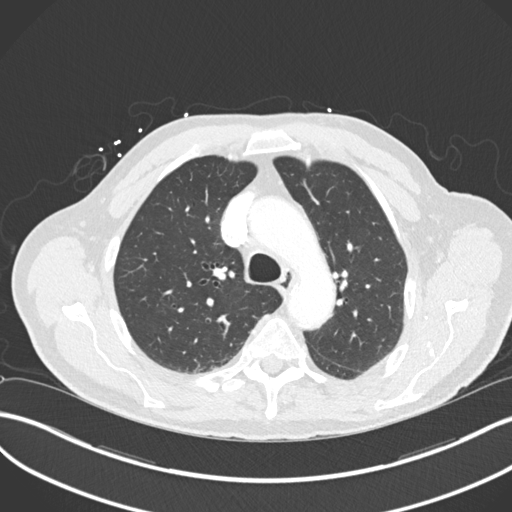
[im 144/183  lung]
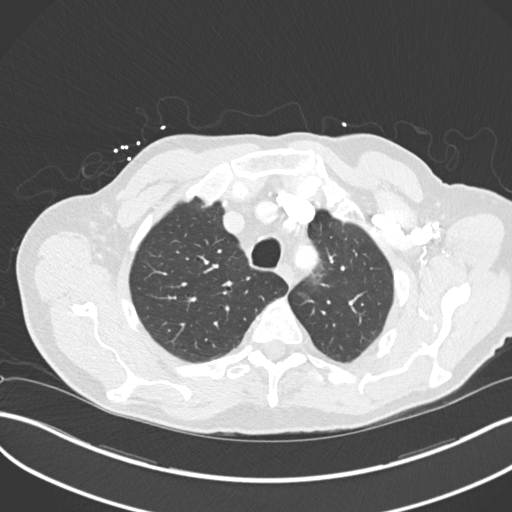
[im 157/183  lung]
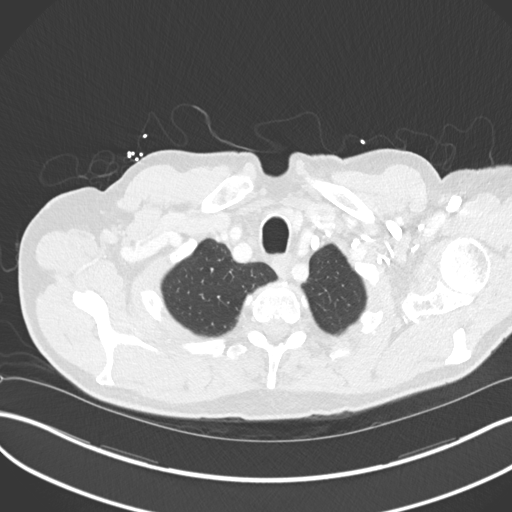
[im 170/183  lung]
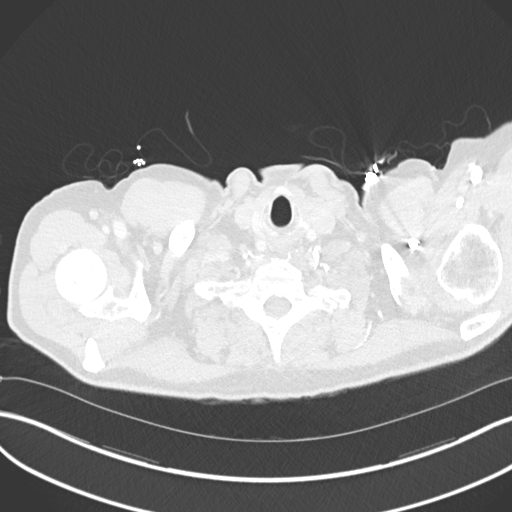

[Series 5: coronal · coronal · 0.75mm/px · 3 of 153 slices shown]
[im 31/153  lung]
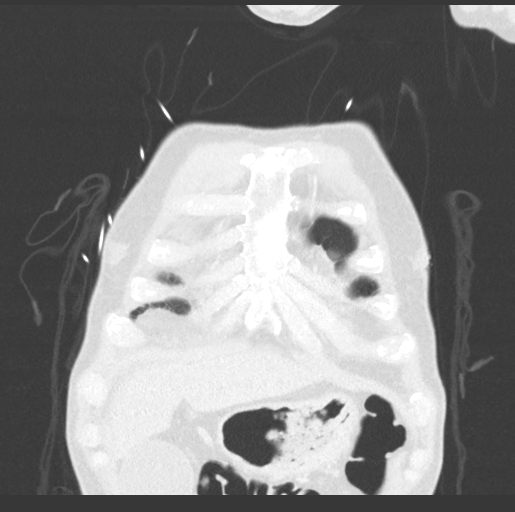
[im 61/153  lung]
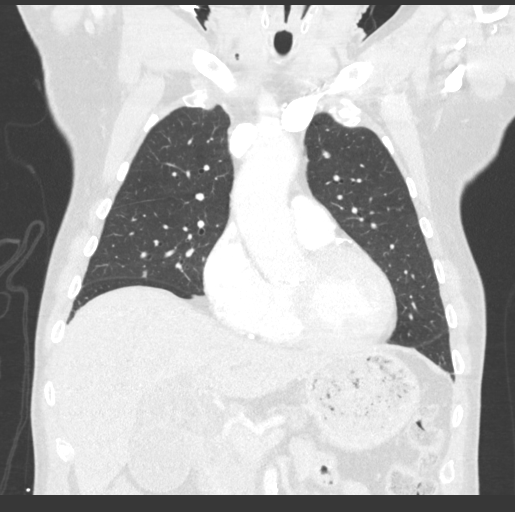
[im 92/153  lung]
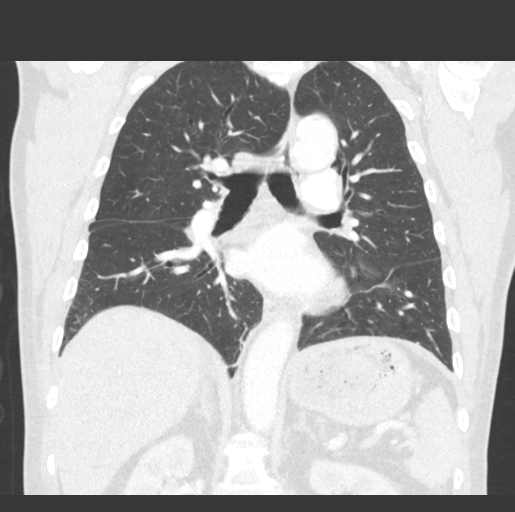

[15 of 36 positions shown; findings below may reference images not displayed]

FINDINGS: Cardiovascular: The heart size is normal. No substantial pericardial
effusion. Coronary artery calcification is evident. Atherosclerotic
calcification is noted in the wall of the thoracic aorta. Ascending
thoracic aorta measures 4.1 cm diameter.

Mediastinum/Nodes: No mediastinal lymphadenopathy. There is no hilar
lymphadenopathy. The esophagus has normal imaging features. There is
no axillary lymphadenopathy.

Lungs/Pleura: 5 mm right middle lobe perifissural nodule identified
on image 102/series 7. There is dependent collapse/consolidative
change in the right lower lobe, likely atelectasis. 4 mm left lower
lobe subpleural nodule noted on 83/7 with another 5 mm left lower
lobe subpleural nodule evident on 78/7. Posterior left lower lobe
calcified granuloma noted. No focal airspace consolidation. No
pleural effusion.

Upper Abdomen: Gallbladder is distended. Intra and extrahepatic
biliary duct dilatation noted with common bile duct stent visualized
in situ, findings better characterized on the dedicated abdomen and
pelvis CT yesterday.

Musculoskeletal: No worrisome lytic or sclerotic osseous
abnormality.
IMPRESSION: 1. No definite metastatic disease in the chest.
2. Scattered tiny bilateral pulmonary nodules. These are
perifissural and subpleural in location, likely lymph nodes.
Attention on follow-up surveillance imaging recommended.
3. Probable atelectasis posterior right lower lobe given new onset
since yesterday's study.
4. Ascending thoracic aortic aneurysm at 4.1 cm diameter. Recommend
annual imaging followup by CTA or MRA. This recommendation follows
7292 ACCF/AHA/AATS/ACR/ASA/SCA/LALEH/RALA/KECIA/LAZZAT Guidelines for the
Diagnosis and Management of Patients with Thoracic Aortic Disease.
Circulation. 7292; 121: E266-e369. Aortic aneurysm NOS (SLEKE-N9E.6)
5.  Aortic Atherosclerois (SLEKE-170.0)
